# Patient Record
Sex: Female | Born: 1935
Health system: Southern US, Community
[De-identification: ages and names within clinical notes are randomized; demographics above are authoritative.]

## PROBLEM LIST (undated history)

## (undated) DIAGNOSIS — H04129 Dry eye syndrome of unspecified lacrimal gland: Secondary | ICD-10-CM

## (undated) DIAGNOSIS — I6523 Occlusion and stenosis of bilateral carotid arteries: Secondary | ICD-10-CM

## (undated) DIAGNOSIS — M1990 Unspecified osteoarthritis, unspecified site: Secondary | ICD-10-CM

## (undated) DIAGNOSIS — K449 Diaphragmatic hernia without obstruction or gangrene: Secondary | ICD-10-CM

## (undated) DIAGNOSIS — K219 Gastro-esophageal reflux disease without esophagitis: Secondary | ICD-10-CM

## (undated) DIAGNOSIS — E039 Hypothyroidism, unspecified: Secondary | ICD-10-CM

## (undated) DIAGNOSIS — G2581 Restless legs syndrome: Secondary | ICD-10-CM

## (undated) DIAGNOSIS — Z8673 Personal history of transient ischemic attack (TIA), and cerebral infarction without residual deficits: Secondary | ICD-10-CM

## (undated) DIAGNOSIS — Z973 Presence of spectacles and contact lenses: Secondary | ICD-10-CM

## (undated) DIAGNOSIS — Z974 Presence of external hearing-aid: Secondary | ICD-10-CM

## (undated) DIAGNOSIS — H353210 Exudative age-related macular degeneration, right eye, stage unspecified: Secondary | ICD-10-CM

## (undated) DIAGNOSIS — I5030 Unspecified diastolic (congestive) heart failure: Secondary | ICD-10-CM

## (undated) DIAGNOSIS — Z8719 Personal history of other diseases of the digestive system: Secondary | ICD-10-CM

## (undated) DIAGNOSIS — Z87898 Personal history of other specified conditions: Secondary | ICD-10-CM

## (undated) DIAGNOSIS — I10 Essential (primary) hypertension: Secondary | ICD-10-CM

## (undated) DIAGNOSIS — R399 Unspecified symptoms and signs involving the genitourinary system: Secondary | ICD-10-CM

## (undated) DIAGNOSIS — I878 Other specified disorders of veins: Secondary | ICD-10-CM

## (undated) DIAGNOSIS — I4892 Unspecified atrial flutter: Secondary | ICD-10-CM

## (undated) DIAGNOSIS — I340 Nonrheumatic mitral (valve) insufficiency: Secondary | ICD-10-CM

## (undated) DIAGNOSIS — T148XXA Other injury of unspecified body region, initial encounter: Secondary | ICD-10-CM

## (undated) HISTORY — PX: TONSILLECTOMY: SUR1361

## (undated) HISTORY — PX: TRANSTHORACIC ECHOCARDIOGRAM: SHX275

## (undated) HISTORY — PX: EYE SURGERY: SHX253

## (undated) HISTORY — PX: APPENDECTOMY: SHX54

## (undated) HISTORY — PX: CARDIOVASCULAR STRESS TEST: SHX262

## (undated) HISTORY — PX: DILATION AND CURETTAGE OF UTERUS: SHX78

## (undated) HISTORY — PX: LAPAROSCOPIC CHOLECYSTECTOMY: SUR755

## (undated) HISTORY — PX: CATARACT EXTRACTION W/ INTRAOCULAR LENS  IMPLANT, BILATERAL: SHX1307

## (undated) HISTORY — PX: FRACTURE SURGERY: SHX138

---

## 2002-05-23 HISTORY — PX: FOOT SURGERY: SHX648

## 2003-05-24 HISTORY — PX: TRIGGER FINGER RELEASE: SHX641

## 2008-05-23 HISTORY — PX: TOTAL KNEE ARTHROPLASTY: SHX125

## 2011-06-07 DIAGNOSIS — R300 Dysuria: Secondary | ICD-10-CM | POA: Diagnosis not present

## 2011-06-07 DIAGNOSIS — N8111 Cystocele, midline: Secondary | ICD-10-CM | POA: Diagnosis not present

## 2011-06-07 DIAGNOSIS — R350 Frequency of micturition: Secondary | ICD-10-CM | POA: Diagnosis not present

## 2011-07-31 DIAGNOSIS — R5381 Other malaise: Secondary | ICD-10-CM | POA: Diagnosis not present

## 2011-07-31 DIAGNOSIS — I10 Essential (primary) hypertension: Secondary | ICD-10-CM | POA: Diagnosis not present

## 2011-07-31 DIAGNOSIS — E7800 Pure hypercholesterolemia, unspecified: Secondary | ICD-10-CM | POA: Diagnosis not present

## 2011-07-31 DIAGNOSIS — R079 Chest pain, unspecified: Secondary | ICD-10-CM | POA: Diagnosis not present

## 2011-07-31 DIAGNOSIS — R42 Dizziness and giddiness: Secondary | ICD-10-CM | POA: Diagnosis not present

## 2011-07-31 DIAGNOSIS — I672 Cerebral atherosclerosis: Secondary | ICD-10-CM | POA: Diagnosis not present

## 2011-07-31 DIAGNOSIS — G319 Degenerative disease of nervous system, unspecified: Secondary | ICD-10-CM | POA: Diagnosis not present

## 2011-07-31 DIAGNOSIS — Z96659 Presence of unspecified artificial knee joint: Secondary | ICD-10-CM | POA: Diagnosis not present

## 2011-07-31 DIAGNOSIS — Z7982 Long term (current) use of aspirin: Secondary | ICD-10-CM | POA: Diagnosis not present

## 2011-07-31 DIAGNOSIS — R269 Unspecified abnormalities of gait and mobility: Secondary | ICD-10-CM | POA: Diagnosis not present

## 2011-07-31 DIAGNOSIS — H532 Diplopia: Secondary | ICD-10-CM | POA: Diagnosis present

## 2011-07-31 DIAGNOSIS — Z87891 Personal history of nicotine dependence: Secondary | ICD-10-CM | POA: Diagnosis not present

## 2011-07-31 DIAGNOSIS — E785 Hyperlipidemia, unspecified: Secondary | ICD-10-CM | POA: Diagnosis not present

## 2011-07-31 DIAGNOSIS — K219 Gastro-esophageal reflux disease without esophagitis: Secondary | ICD-10-CM | POA: Diagnosis not present

## 2011-07-31 DIAGNOSIS — E039 Hypothyroidism, unspecified: Secondary | ICD-10-CM | POA: Diagnosis not present

## 2011-07-31 DIAGNOSIS — I6789 Other cerebrovascular disease: Secondary | ICD-10-CM | POA: Diagnosis not present

## 2011-07-31 DIAGNOSIS — H538 Other visual disturbances: Secondary | ICD-10-CM | POA: Diagnosis not present

## 2011-07-31 DIAGNOSIS — I6529 Occlusion and stenosis of unspecified carotid artery: Secondary | ICD-10-CM | POA: Diagnosis not present

## 2011-07-31 DIAGNOSIS — I6509 Occlusion and stenosis of unspecified vertebral artery: Secondary | ICD-10-CM | POA: Diagnosis not present

## 2011-07-31 DIAGNOSIS — G459 Transient cerebral ischemic attack, unspecified: Secondary | ICD-10-CM | POA: Diagnosis not present

## 2011-08-02 DIAGNOSIS — I452 Bifascicular block: Secondary | ICD-10-CM | POA: Diagnosis not present

## 2011-08-12 DIAGNOSIS — E7800 Pure hypercholesterolemia, unspecified: Secondary | ICD-10-CM | POA: Diagnosis not present

## 2011-08-12 DIAGNOSIS — K219 Gastro-esophageal reflux disease without esophagitis: Secondary | ICD-10-CM | POA: Diagnosis not present

## 2011-08-20 DIAGNOSIS — E039 Hypothyroidism, unspecified: Secondary | ICD-10-CM | POA: Diagnosis not present

## 2011-08-20 DIAGNOSIS — I6350 Cerebral infarction due to unspecified occlusion or stenosis of unspecified cerebral artery: Secondary | ICD-10-CM | POA: Diagnosis not present

## 2011-08-20 DIAGNOSIS — I6789 Other cerebrovascular disease: Secondary | ICD-10-CM | POA: Diagnosis not present

## 2011-08-20 DIAGNOSIS — I10 Essential (primary) hypertension: Secondary | ICD-10-CM | POA: Diagnosis not present

## 2011-08-20 DIAGNOSIS — Z7902 Long term (current) use of antithrombotics/antiplatelets: Secondary | ICD-10-CM | POA: Diagnosis not present

## 2011-08-20 DIAGNOSIS — R4182 Altered mental status, unspecified: Secondary | ICD-10-CM | POA: Diagnosis not present

## 2011-08-20 DIAGNOSIS — I6509 Occlusion and stenosis of unspecified vertebral artery: Secondary | ICD-10-CM | POA: Diagnosis not present

## 2011-08-20 DIAGNOSIS — E785 Hyperlipidemia, unspecified: Secondary | ICD-10-CM | POA: Diagnosis not present

## 2011-08-20 DIAGNOSIS — G459 Transient cerebral ischemic attack, unspecified: Secondary | ICD-10-CM | POA: Diagnosis not present

## 2011-08-20 DIAGNOSIS — R4789 Other speech disturbances: Secondary | ICD-10-CM | POA: Diagnosis not present

## 2011-08-20 DIAGNOSIS — Z7982 Long term (current) use of aspirin: Secondary | ICD-10-CM | POA: Diagnosis not present

## 2011-08-20 DIAGNOSIS — I69992 Facial weakness following unspecified cerebrovascular disease: Secondary | ICD-10-CM | POA: Diagnosis not present

## 2011-08-20 DIAGNOSIS — R110 Nausea: Secondary | ICD-10-CM | POA: Diagnosis not present

## 2011-08-20 DIAGNOSIS — R269 Unspecified abnormalities of gait and mobility: Secondary | ICD-10-CM | POA: Diagnosis not present

## 2011-08-21 DIAGNOSIS — G459 Transient cerebral ischemic attack, unspecified: Secondary | ICD-10-CM | POA: Diagnosis not present

## 2011-09-20 DIAGNOSIS — E785 Hyperlipidemia, unspecified: Secondary | ICD-10-CM | POA: Diagnosis not present

## 2011-09-20 DIAGNOSIS — I6509 Occlusion and stenosis of unspecified vertebral artery: Secondary | ICD-10-CM | POA: Diagnosis not present

## 2011-09-20 DIAGNOSIS — G459 Transient cerebral ischemic attack, unspecified: Secondary | ICD-10-CM | POA: Diagnosis not present

## 2011-09-20 DIAGNOSIS — I10 Essential (primary) hypertension: Secondary | ICD-10-CM | POA: Diagnosis not present

## 2011-09-20 DIAGNOSIS — E039 Hypothyroidism, unspecified: Secondary | ICD-10-CM | POA: Diagnosis not present

## 2011-09-22 DIAGNOSIS — R0789 Other chest pain: Secondary | ICD-10-CM | POA: Diagnosis not present

## 2011-09-22 DIAGNOSIS — E039 Hypothyroidism, unspecified: Secondary | ICD-10-CM | POA: Diagnosis not present

## 2011-09-22 DIAGNOSIS — I10 Essential (primary) hypertension: Secondary | ICD-10-CM | POA: Diagnosis not present

## 2011-09-22 DIAGNOSIS — E782 Mixed hyperlipidemia: Secondary | ICD-10-CM | POA: Diagnosis not present

## 2011-09-23 DIAGNOSIS — E038 Other specified hypothyroidism: Secondary | ICD-10-CM | POA: Diagnosis not present

## 2011-09-23 DIAGNOSIS — R5383 Other fatigue: Secondary | ICD-10-CM | POA: Diagnosis not present

## 2011-09-23 DIAGNOSIS — R5381 Other malaise: Secondary | ICD-10-CM | POA: Diagnosis not present

## 2011-09-23 DIAGNOSIS — E7800 Pure hypercholesterolemia, unspecified: Secondary | ICD-10-CM | POA: Diagnosis not present

## 2011-09-28 DIAGNOSIS — H04129 Dry eye syndrome of unspecified lacrimal gland: Secondary | ICD-10-CM | POA: Diagnosis not present

## 2011-09-30 DIAGNOSIS — R079 Chest pain, unspecified: Secondary | ICD-10-CM | POA: Diagnosis not present

## 2011-10-07 DIAGNOSIS — I10 Essential (primary) hypertension: Secondary | ICD-10-CM | POA: Diagnosis not present

## 2011-10-07 DIAGNOSIS — E782 Mixed hyperlipidemia: Secondary | ICD-10-CM | POA: Diagnosis not present

## 2011-10-07 DIAGNOSIS — E039 Hypothyroidism, unspecified: Secondary | ICD-10-CM | POA: Diagnosis not present

## 2011-10-07 DIAGNOSIS — R0789 Other chest pain: Secondary | ICD-10-CM | POA: Diagnosis not present

## 2011-10-21 DIAGNOSIS — E878 Other disorders of electrolyte and fluid balance, not elsewhere classified: Secondary | ICD-10-CM | POA: Diagnosis not present

## 2011-10-26 DIAGNOSIS — K589 Irritable bowel syndrome without diarrhea: Secondary | ICD-10-CM | POA: Diagnosis not present

## 2011-10-26 DIAGNOSIS — R0789 Other chest pain: Secondary | ICD-10-CM | POA: Diagnosis not present

## 2011-10-26 DIAGNOSIS — K219 Gastro-esophageal reflux disease without esophagitis: Secondary | ICD-10-CM | POA: Diagnosis not present

## 2011-10-26 DIAGNOSIS — E782 Mixed hyperlipidemia: Secondary | ICD-10-CM | POA: Diagnosis not present

## 2011-10-26 DIAGNOSIS — I10 Essential (primary) hypertension: Secondary | ICD-10-CM | POA: Diagnosis not present

## 2011-10-26 DIAGNOSIS — E039 Hypothyroidism, unspecified: Secondary | ICD-10-CM | POA: Diagnosis not present

## 2011-10-27 DIAGNOSIS — M545 Low back pain: Secondary | ICD-10-CM | POA: Diagnosis not present

## 2011-11-08 DIAGNOSIS — E878 Other disorders of electrolyte and fluid balance, not elsewhere classified: Secondary | ICD-10-CM | POA: Diagnosis not present

## 2011-11-22 DIAGNOSIS — R748 Abnormal levels of other serum enzymes: Secondary | ICD-10-CM | POA: Diagnosis not present

## 2011-11-23 DIAGNOSIS — K769 Liver disease, unspecified: Secondary | ICD-10-CM | POA: Diagnosis not present

## 2011-11-23 DIAGNOSIS — H259 Unspecified age-related cataract: Secondary | ICD-10-CM | POA: Diagnosis not present

## 2011-11-23 DIAGNOSIS — H04529 Eversion of unspecified lacrimal punctum: Secondary | ICD-10-CM | POA: Diagnosis not present

## 2011-11-25 DIAGNOSIS — F322 Major depressive disorder, single episode, severe without psychotic features: Secondary | ICD-10-CM | POA: Diagnosis not present

## 2011-11-29 DIAGNOSIS — K219 Gastro-esophageal reflux disease without esophagitis: Secondary | ICD-10-CM | POA: Diagnosis not present

## 2011-11-30 DIAGNOSIS — R0789 Other chest pain: Secondary | ICD-10-CM | POA: Diagnosis not present

## 2011-11-30 DIAGNOSIS — I10 Essential (primary) hypertension: Secondary | ICD-10-CM | POA: Diagnosis not present

## 2011-11-30 DIAGNOSIS — E782 Mixed hyperlipidemia: Secondary | ICD-10-CM | POA: Diagnosis not present

## 2011-11-30 DIAGNOSIS — E039 Hypothyroidism, unspecified: Secondary | ICD-10-CM | POA: Diagnosis not present

## 2011-12-01 DIAGNOSIS — K219 Gastro-esophageal reflux disease without esophagitis: Secondary | ICD-10-CM | POA: Diagnosis not present

## 2011-12-01 DIAGNOSIS — R933 Abnormal findings on diagnostic imaging of other parts of digestive tract: Secondary | ICD-10-CM | POA: Diagnosis not present

## 2011-12-01 DIAGNOSIS — R1013 Epigastric pain: Secondary | ICD-10-CM | POA: Diagnosis not present

## 2011-12-01 DIAGNOSIS — R7989 Other specified abnormal findings of blood chemistry: Secondary | ICD-10-CM | POA: Diagnosis not present

## 2011-12-05 DIAGNOSIS — R5381 Other malaise: Secondary | ICD-10-CM | POA: Diagnosis not present

## 2011-12-05 DIAGNOSIS — F329 Major depressive disorder, single episode, unspecified: Secondary | ICD-10-CM | POA: Diagnosis not present

## 2011-12-05 DIAGNOSIS — R945 Abnormal results of liver function studies: Secondary | ICD-10-CM | POA: Diagnosis not present

## 2011-12-05 DIAGNOSIS — R5383 Other fatigue: Secondary | ICD-10-CM | POA: Diagnosis not present

## 2011-12-07 DIAGNOSIS — R5381 Other malaise: Secondary | ICD-10-CM | POA: Diagnosis not present

## 2011-12-07 DIAGNOSIS — R5383 Other fatigue: Secondary | ICD-10-CM | POA: Diagnosis not present

## 2011-12-07 DIAGNOSIS — R1013 Epigastric pain: Secondary | ICD-10-CM | POA: Diagnosis not present

## 2011-12-07 DIAGNOSIS — R933 Abnormal findings on diagnostic imaging of other parts of digestive tract: Secondary | ICD-10-CM | POA: Diagnosis not present

## 2011-12-07 DIAGNOSIS — R7989 Other specified abnormal findings of blood chemistry: Secondary | ICD-10-CM | POA: Diagnosis not present

## 2011-12-07 DIAGNOSIS — K745 Biliary cirrhosis, unspecified: Secondary | ICD-10-CM | POA: Diagnosis not present

## 2011-12-07 DIAGNOSIS — Z79899 Other long term (current) drug therapy: Secondary | ICD-10-CM | POA: Diagnosis not present

## 2011-12-09 DIAGNOSIS — M546 Pain in thoracic spine: Secondary | ICD-10-CM | POA: Diagnosis not present

## 2011-12-12 DIAGNOSIS — R109 Unspecified abdominal pain: Secondary | ICD-10-CM | POA: Diagnosis not present

## 2011-12-12 DIAGNOSIS — R7989 Other specified abnormal findings of blood chemistry: Secondary | ICD-10-CM | POA: Diagnosis not present

## 2011-12-14 DIAGNOSIS — K589 Irritable bowel syndrome without diarrhea: Secondary | ICD-10-CM | POA: Diagnosis not present

## 2011-12-14 DIAGNOSIS — K219 Gastro-esophageal reflux disease without esophagitis: Secondary | ICD-10-CM | POA: Diagnosis not present

## 2011-12-14 DIAGNOSIS — R945 Abnormal results of liver function studies: Secondary | ICD-10-CM | POA: Diagnosis not present

## 2011-12-16 DIAGNOSIS — M47814 Spondylosis without myelopathy or radiculopathy, thoracic region: Secondary | ICD-10-CM | POA: Diagnosis not present

## 2011-12-20 DIAGNOSIS — R5383 Other fatigue: Secondary | ICD-10-CM | POA: Diagnosis not present

## 2011-12-20 DIAGNOSIS — I10 Essential (primary) hypertension: Secondary | ICD-10-CM | POA: Diagnosis not present

## 2011-12-27 DIAGNOSIS — H353190 Nonexudative age-related macular degeneration, unspecified eye, stage unspecified: Secondary | ICD-10-CM | POA: Diagnosis not present

## 2011-12-27 DIAGNOSIS — G459 Transient cerebral ischemic attack, unspecified: Secondary | ICD-10-CM | POA: Diagnosis not present

## 2011-12-27 DIAGNOSIS — H43819 Vitreous degeneration, unspecified eye: Secondary | ICD-10-CM | POA: Diagnosis not present

## 2011-12-27 DIAGNOSIS — I6789 Other cerebrovascular disease: Secondary | ICD-10-CM | POA: Diagnosis not present

## 2011-12-27 DIAGNOSIS — I10 Essential (primary) hypertension: Secondary | ICD-10-CM | POA: Diagnosis not present

## 2011-12-27 DIAGNOSIS — H259 Unspecified age-related cataract: Secondary | ICD-10-CM | POA: Diagnosis not present

## 2011-12-27 DIAGNOSIS — E782 Mixed hyperlipidemia: Secondary | ICD-10-CM | POA: Diagnosis not present

## 2011-12-28 DIAGNOSIS — D259 Leiomyoma of uterus, unspecified: Secondary | ICD-10-CM | POA: Diagnosis not present

## 2011-12-28 DIAGNOSIS — Z1289 Encounter for screening for malignant neoplasm of other sites: Secondary | ICD-10-CM | POA: Diagnosis not present

## 2011-12-28 DIAGNOSIS — N854 Malposition of uterus: Secondary | ICD-10-CM | POA: Diagnosis not present

## 2011-12-28 DIAGNOSIS — N952 Postmenopausal atrophic vaginitis: Secondary | ICD-10-CM | POA: Diagnosis not present

## 2011-12-30 DIAGNOSIS — R1013 Epigastric pain: Secondary | ICD-10-CM | POA: Diagnosis not present

## 2012-01-03 DIAGNOSIS — R1012 Left upper quadrant pain: Secondary | ICD-10-CM | POA: Diagnosis not present

## 2012-01-13 DIAGNOSIS — R7611 Nonspecific reaction to tuberculin skin test without active tuberculosis: Secondary | ICD-10-CM | POA: Diagnosis not present

## 2012-01-17 DIAGNOSIS — H259 Unspecified age-related cataract: Secondary | ICD-10-CM | POA: Diagnosis not present

## 2012-02-01 DIAGNOSIS — F411 Generalized anxiety disorder: Secondary | ICD-10-CM | POA: Diagnosis not present

## 2012-02-01 DIAGNOSIS — I10 Essential (primary) hypertension: Secondary | ICD-10-CM | POA: Diagnosis not present

## 2012-02-10 DIAGNOSIS — H3530 Unspecified macular degeneration: Secondary | ICD-10-CM | POA: Diagnosis not present

## 2012-02-10 DIAGNOSIS — IMO0002 Reserved for concepts with insufficient information to code with codable children: Secondary | ICD-10-CM | POA: Diagnosis not present

## 2012-02-16 DIAGNOSIS — Z01818 Encounter for other preprocedural examination: Secondary | ICD-10-CM | POA: Diagnosis not present

## 2012-02-16 DIAGNOSIS — H269 Unspecified cataract: Secondary | ICD-10-CM | POA: Diagnosis not present

## 2012-03-08 DIAGNOSIS — IMO0002 Reserved for concepts with insufficient information to code with codable children: Secondary | ICD-10-CM | POA: Diagnosis not present

## 2012-03-08 DIAGNOSIS — H2510 Age-related nuclear cataract, unspecified eye: Secondary | ICD-10-CM | POA: Diagnosis not present

## 2012-03-08 DIAGNOSIS — H259 Unspecified age-related cataract: Secondary | ICD-10-CM | POA: Diagnosis not present

## 2012-03-14 DIAGNOSIS — K219 Gastro-esophageal reflux disease without esophagitis: Secondary | ICD-10-CM | POA: Diagnosis not present

## 2012-03-14 DIAGNOSIS — Z86010 Personal history of colonic polyps: Secondary | ICD-10-CM | POA: Diagnosis not present

## 2012-03-14 DIAGNOSIS — K589 Irritable bowel syndrome without diarrhea: Secondary | ICD-10-CM | POA: Diagnosis not present

## 2012-04-18 DIAGNOSIS — Z1231 Encounter for screening mammogram for malignant neoplasm of breast: Secondary | ICD-10-CM | POA: Diagnosis not present

## 2012-04-18 DIAGNOSIS — Z9189 Other specified personal risk factors, not elsewhere classified: Secondary | ICD-10-CM | POA: Diagnosis not present

## 2012-05-02 DIAGNOSIS — I10 Essential (primary) hypertension: Secondary | ICD-10-CM | POA: Diagnosis not present

## 2012-05-28 DIAGNOSIS — I6789 Other cerebrovascular disease: Secondary | ICD-10-CM | POA: Diagnosis not present

## 2012-05-28 DIAGNOSIS — I10 Essential (primary) hypertension: Secondary | ICD-10-CM | POA: Diagnosis not present

## 2012-05-28 DIAGNOSIS — G459 Transient cerebral ischemic attack, unspecified: Secondary | ICD-10-CM | POA: Diagnosis not present

## 2012-05-28 DIAGNOSIS — E782 Mixed hyperlipidemia: Secondary | ICD-10-CM | POA: Diagnosis not present

## 2012-05-28 DIAGNOSIS — Z01818 Encounter for other preprocedural examination: Secondary | ICD-10-CM | POA: Diagnosis not present

## 2012-06-08 DIAGNOSIS — H01009 Unspecified blepharitis unspecified eye, unspecified eyelid: Secondary | ICD-10-CM | POA: Diagnosis not present

## 2012-06-11 DIAGNOSIS — H2510 Age-related nuclear cataract, unspecified eye: Secondary | ICD-10-CM | POA: Diagnosis not present

## 2012-06-11 DIAGNOSIS — IMO0002 Reserved for concepts with insufficient information to code with codable children: Secondary | ICD-10-CM | POA: Diagnosis not present

## 2012-06-11 DIAGNOSIS — H259 Unspecified age-related cataract: Secondary | ICD-10-CM | POA: Diagnosis not present

## 2012-07-16 DIAGNOSIS — H18839 Recurrent erosion of cornea, unspecified eye: Secondary | ICD-10-CM | POA: Diagnosis not present

## 2012-07-18 DIAGNOSIS — G459 Transient cerebral ischemic attack, unspecified: Secondary | ICD-10-CM | POA: Diagnosis not present

## 2012-07-18 DIAGNOSIS — E782 Mixed hyperlipidemia: Secondary | ICD-10-CM | POA: Diagnosis not present

## 2012-07-18 DIAGNOSIS — I10 Essential (primary) hypertension: Secondary | ICD-10-CM | POA: Diagnosis not present

## 2012-07-18 DIAGNOSIS — I6789 Other cerebrovascular disease: Secondary | ICD-10-CM | POA: Diagnosis not present

## 2012-07-23 DIAGNOSIS — H18839 Recurrent erosion of cornea, unspecified eye: Secondary | ICD-10-CM | POA: Diagnosis not present

## 2012-07-29 DIAGNOSIS — S058X9A Other injuries of unspecified eye and orbit, initial encounter: Secondary | ICD-10-CM | POA: Diagnosis not present

## 2012-07-31 DIAGNOSIS — S058X9A Other injuries of unspecified eye and orbit, initial encounter: Secondary | ICD-10-CM | POA: Diagnosis not present

## 2012-07-31 DIAGNOSIS — H353190 Nonexudative age-related macular degeneration, unspecified eye, stage unspecified: Secondary | ICD-10-CM | POA: Diagnosis not present

## 2012-07-31 DIAGNOSIS — H43819 Vitreous degeneration, unspecified eye: Secondary | ICD-10-CM | POA: Diagnosis not present

## 2012-08-01 DIAGNOSIS — J069 Acute upper respiratory infection, unspecified: Secondary | ICD-10-CM | POA: Diagnosis not present

## 2012-08-01 DIAGNOSIS — J029 Acute pharyngitis, unspecified: Secondary | ICD-10-CM | POA: Diagnosis not present

## 2012-08-01 DIAGNOSIS — B9789 Other viral agents as the cause of diseases classified elsewhere: Secondary | ICD-10-CM | POA: Diagnosis not present

## 2012-08-08 DIAGNOSIS — H18839 Recurrent erosion of cornea, unspecified eye: Secondary | ICD-10-CM | POA: Diagnosis not present

## 2012-08-13 DIAGNOSIS — H18839 Recurrent erosion of cornea, unspecified eye: Secondary | ICD-10-CM | POA: Diagnosis not present

## 2012-08-21 DIAGNOSIS — H18839 Recurrent erosion of cornea, unspecified eye: Secondary | ICD-10-CM | POA: Diagnosis not present

## 2012-09-05 DIAGNOSIS — Z86010 Personal history of colonic polyps: Secondary | ICD-10-CM | POA: Diagnosis not present

## 2012-09-05 DIAGNOSIS — K5900 Constipation, unspecified: Secondary | ICD-10-CM | POA: Diagnosis not present

## 2012-09-05 DIAGNOSIS — K589 Irritable bowel syndrome without diarrhea: Secondary | ICD-10-CM | POA: Diagnosis not present

## 2012-09-05 DIAGNOSIS — K219 Gastro-esophageal reflux disease without esophagitis: Secondary | ICD-10-CM | POA: Diagnosis not present

## 2012-09-05 DIAGNOSIS — Z1211 Encounter for screening for malignant neoplasm of colon: Secondary | ICD-10-CM | POA: Diagnosis not present

## 2012-09-06 DIAGNOSIS — H18839 Recurrent erosion of cornea, unspecified eye: Secondary | ICD-10-CM | POA: Diagnosis not present

## 2012-09-07 DIAGNOSIS — Z1211 Encounter for screening for malignant neoplasm of colon: Secondary | ICD-10-CM | POA: Diagnosis not present

## 2012-09-08 DIAGNOSIS — Z1211 Encounter for screening for malignant neoplasm of colon: Secondary | ICD-10-CM | POA: Diagnosis not present

## 2012-09-17 DIAGNOSIS — B9789 Other viral agents as the cause of diseases classified elsewhere: Secondary | ICD-10-CM | POA: Diagnosis not present

## 2012-09-17 DIAGNOSIS — J069 Acute upper respiratory infection, unspecified: Secondary | ICD-10-CM | POA: Diagnosis not present

## 2012-09-17 DIAGNOSIS — R05 Cough: Secondary | ICD-10-CM | POA: Diagnosis not present

## 2012-09-19 DIAGNOSIS — H18839 Recurrent erosion of cornea, unspecified eye: Secondary | ICD-10-CM | POA: Diagnosis not present

## 2012-09-25 DIAGNOSIS — H18839 Recurrent erosion of cornea, unspecified eye: Secondary | ICD-10-CM | POA: Diagnosis not present

## 2012-09-26 DIAGNOSIS — J309 Allergic rhinitis, unspecified: Secondary | ICD-10-CM | POA: Diagnosis not present

## 2012-09-26 DIAGNOSIS — K769 Liver disease, unspecified: Secondary | ICD-10-CM | POA: Diagnosis not present

## 2012-09-30 DIAGNOSIS — H18839 Recurrent erosion of cornea, unspecified eye: Secondary | ICD-10-CM | POA: Diagnosis not present

## 2012-10-03 DIAGNOSIS — J309 Allergic rhinitis, unspecified: Secondary | ICD-10-CM | POA: Diagnosis not present

## 2012-10-04 DIAGNOSIS — H18839 Recurrent erosion of cornea, unspecified eye: Secondary | ICD-10-CM | POA: Diagnosis not present

## 2012-10-10 DIAGNOSIS — H18839 Recurrent erosion of cornea, unspecified eye: Secondary | ICD-10-CM | POA: Diagnosis not present

## 2012-10-25 DIAGNOSIS — H18839 Recurrent erosion of cornea, unspecified eye: Secondary | ICD-10-CM | POA: Diagnosis not present

## 2012-11-19 DIAGNOSIS — H18839 Recurrent erosion of cornea, unspecified eye: Secondary | ICD-10-CM | POA: Diagnosis not present

## 2012-11-22 DIAGNOSIS — H18839 Recurrent erosion of cornea, unspecified eye: Secondary | ICD-10-CM | POA: Diagnosis not present

## 2012-11-26 DIAGNOSIS — H18839 Recurrent erosion of cornea, unspecified eye: Secondary | ICD-10-CM | POA: Diagnosis not present

## 2012-12-05 DIAGNOSIS — H18839 Recurrent erosion of cornea, unspecified eye: Secondary | ICD-10-CM | POA: Diagnosis not present

## 2012-12-10 DIAGNOSIS — H18839 Recurrent erosion of cornea, unspecified eye: Secondary | ICD-10-CM | POA: Diagnosis not present

## 2012-12-13 DIAGNOSIS — IMO0002 Reserved for concepts with insufficient information to code with codable children: Secondary | ICD-10-CM | POA: Diagnosis not present

## 2012-12-13 DIAGNOSIS — H04129 Dry eye syndrome of unspecified lacrimal gland: Secondary | ICD-10-CM | POA: Diagnosis not present

## 2012-12-14 DIAGNOSIS — H04119 Dacryops of unspecified lacrimal gland: Secondary | ICD-10-CM | POA: Diagnosis not present

## 2012-12-20 DIAGNOSIS — H18839 Recurrent erosion of cornea, unspecified eye: Secondary | ICD-10-CM | POA: Diagnosis not present

## 2012-12-26 DIAGNOSIS — K110 Atrophy of salivary gland: Secondary | ICD-10-CM | POA: Diagnosis not present

## 2012-12-27 DIAGNOSIS — H18839 Recurrent erosion of cornea, unspecified eye: Secondary | ICD-10-CM | POA: Diagnosis not present

## 2013-01-08 DIAGNOSIS — H18839 Recurrent erosion of cornea, unspecified eye: Secondary | ICD-10-CM | POA: Diagnosis not present

## 2013-01-13 DIAGNOSIS — H18839 Recurrent erosion of cornea, unspecified eye: Secondary | ICD-10-CM | POA: Diagnosis not present

## 2013-01-16 DIAGNOSIS — H18839 Recurrent erosion of cornea, unspecified eye: Secondary | ICD-10-CM | POA: Diagnosis not present

## 2013-01-24 DIAGNOSIS — H18839 Recurrent erosion of cornea, unspecified eye: Secondary | ICD-10-CM | POA: Diagnosis not present

## 2013-01-24 DIAGNOSIS — H168 Other keratitis: Secondary | ICD-10-CM | POA: Diagnosis not present

## 2013-01-30 DIAGNOSIS — H18839 Recurrent erosion of cornea, unspecified eye: Secondary | ICD-10-CM | POA: Diagnosis not present

## 2013-01-30 DIAGNOSIS — H16109 Unspecified superficial keratitis, unspecified eye: Secondary | ICD-10-CM | POA: Diagnosis not present

## 2013-02-04 DIAGNOSIS — E782 Mixed hyperlipidemia: Secondary | ICD-10-CM | POA: Diagnosis not present

## 2013-02-04 DIAGNOSIS — G459 Transient cerebral ischemic attack, unspecified: Secondary | ICD-10-CM | POA: Diagnosis not present

## 2013-02-04 DIAGNOSIS — I6789 Other cerebrovascular disease: Secondary | ICD-10-CM | POA: Diagnosis not present

## 2013-02-04 DIAGNOSIS — I10 Essential (primary) hypertension: Secondary | ICD-10-CM | POA: Diagnosis not present

## 2013-02-07 DIAGNOSIS — H18839 Recurrent erosion of cornea, unspecified eye: Secondary | ICD-10-CM | POA: Diagnosis not present

## 2013-02-26 DIAGNOSIS — H18839 Recurrent erosion of cornea, unspecified eye: Secondary | ICD-10-CM | POA: Diagnosis not present

## 2013-03-04 DIAGNOSIS — H18839 Recurrent erosion of cornea, unspecified eye: Secondary | ICD-10-CM | POA: Diagnosis not present

## 2013-03-25 DIAGNOSIS — H18839 Recurrent erosion of cornea, unspecified eye: Secondary | ICD-10-CM | POA: Diagnosis not present

## 2013-04-03 DIAGNOSIS — I10 Essential (primary) hypertension: Secondary | ICD-10-CM | POA: Diagnosis not present

## 2013-04-03 DIAGNOSIS — E039 Hypothyroidism, unspecified: Secondary | ICD-10-CM | POA: Diagnosis not present

## 2013-04-08 DIAGNOSIS — H18839 Recurrent erosion of cornea, unspecified eye: Secondary | ICD-10-CM | POA: Diagnosis not present

## 2013-04-10 DIAGNOSIS — K219 Gastro-esophageal reflux disease without esophagitis: Secondary | ICD-10-CM | POA: Diagnosis not present

## 2013-04-10 DIAGNOSIS — K589 Irritable bowel syndrome without diarrhea: Secondary | ICD-10-CM | POA: Diagnosis not present

## 2013-04-10 DIAGNOSIS — K745 Biliary cirrhosis, unspecified: Secondary | ICD-10-CM | POA: Diagnosis not present

## 2013-04-15 DIAGNOSIS — J069 Acute upper respiratory infection, unspecified: Secondary | ICD-10-CM | POA: Diagnosis not present

## 2013-04-15 DIAGNOSIS — B9789 Other viral agents as the cause of diseases classified elsewhere: Secondary | ICD-10-CM | POA: Diagnosis not present

## 2013-04-26 DIAGNOSIS — H04129 Dry eye syndrome of unspecified lacrimal gland: Secondary | ICD-10-CM | POA: Diagnosis not present

## 2013-04-29 DIAGNOSIS — H18839 Recurrent erosion of cornea, unspecified eye: Secondary | ICD-10-CM | POA: Diagnosis not present

## 2013-04-30 DIAGNOSIS — E039 Hypothyroidism, unspecified: Secondary | ICD-10-CM | POA: Diagnosis not present

## 2013-04-30 DIAGNOSIS — G459 Transient cerebral ischemic attack, unspecified: Secondary | ICD-10-CM | POA: Diagnosis not present

## 2013-04-30 DIAGNOSIS — I10 Essential (primary) hypertension: Secondary | ICD-10-CM | POA: Diagnosis not present

## 2013-05-06 DIAGNOSIS — H18839 Recurrent erosion of cornea, unspecified eye: Secondary | ICD-10-CM | POA: Diagnosis not present

## 2013-05-07 DIAGNOSIS — H18839 Recurrent erosion of cornea, unspecified eye: Secondary | ICD-10-CM | POA: Diagnosis not present

## 2013-05-07 DIAGNOSIS — H2510 Age-related nuclear cataract, unspecified eye: Secondary | ICD-10-CM | POA: Diagnosis not present

## 2013-05-07 DIAGNOSIS — H04129 Dry eye syndrome of unspecified lacrimal gland: Secondary | ICD-10-CM | POA: Diagnosis not present

## 2013-05-12 DIAGNOSIS — H18839 Recurrent erosion of cornea, unspecified eye: Secondary | ICD-10-CM | POA: Diagnosis not present

## 2013-05-20 DIAGNOSIS — H18839 Recurrent erosion of cornea, unspecified eye: Secondary | ICD-10-CM | POA: Diagnosis not present

## 2013-05-21 DIAGNOSIS — H04129 Dry eye syndrome of unspecified lacrimal gland: Secondary | ICD-10-CM | POA: Diagnosis not present

## 2013-05-21 DIAGNOSIS — H01009 Unspecified blepharitis unspecified eye, unspecified eyelid: Secondary | ICD-10-CM | POA: Diagnosis not present

## 2013-05-29 DIAGNOSIS — Z1231 Encounter for screening mammogram for malignant neoplasm of breast: Secondary | ICD-10-CM | POA: Diagnosis not present

## 2013-05-30 DIAGNOSIS — H18839 Recurrent erosion of cornea, unspecified eye: Secondary | ICD-10-CM | POA: Diagnosis not present

## 2013-05-31 DIAGNOSIS — L989 Disorder of the skin and subcutaneous tissue, unspecified: Secondary | ICD-10-CM | POA: Diagnosis not present

## 2013-05-31 DIAGNOSIS — H6120 Impacted cerumen, unspecified ear: Secondary | ICD-10-CM | POA: Diagnosis not present

## 2013-06-20 DIAGNOSIS — H01009 Unspecified blepharitis unspecified eye, unspecified eyelid: Secondary | ICD-10-CM | POA: Diagnosis not present

## 2013-06-20 DIAGNOSIS — H04129 Dry eye syndrome of unspecified lacrimal gland: Secondary | ICD-10-CM | POA: Diagnosis not present

## 2013-06-26 DIAGNOSIS — I10 Essential (primary) hypertension: Secondary | ICD-10-CM | POA: Diagnosis not present

## 2013-06-26 DIAGNOSIS — I6789 Other cerebrovascular disease: Secondary | ICD-10-CM | POA: Diagnosis not present

## 2013-06-26 DIAGNOSIS — E782 Mixed hyperlipidemia: Secondary | ICD-10-CM | POA: Diagnosis not present

## 2013-06-26 DIAGNOSIS — G459 Transient cerebral ischemic attack, unspecified: Secondary | ICD-10-CM | POA: Diagnosis not present

## 2013-06-26 DIAGNOSIS — E7800 Pure hypercholesterolemia, unspecified: Secondary | ICD-10-CM | POA: Diagnosis not present

## 2013-07-04 DIAGNOSIS — R030 Elevated blood-pressure reading, without diagnosis of hypertension: Secondary | ICD-10-CM | POA: Diagnosis not present

## 2013-07-09 DIAGNOSIS — E878 Other disorders of electrolyte and fluid balance, not elsewhere classified: Secondary | ICD-10-CM | POA: Diagnosis not present

## 2013-07-17 DIAGNOSIS — E782 Mixed hyperlipidemia: Secondary | ICD-10-CM | POA: Diagnosis not present

## 2013-07-17 DIAGNOSIS — R0789 Other chest pain: Secondary | ICD-10-CM | POA: Diagnosis not present

## 2013-07-17 DIAGNOSIS — J449 Chronic obstructive pulmonary disease, unspecified: Secondary | ICD-10-CM | POA: Diagnosis not present

## 2013-07-17 DIAGNOSIS — I10 Essential (primary) hypertension: Secondary | ICD-10-CM | POA: Diagnosis not present

## 2013-07-19 DIAGNOSIS — N8111 Cystocele, midline: Secondary | ICD-10-CM | POA: Diagnosis not present

## 2013-07-19 DIAGNOSIS — IMO0002 Reserved for concepts with insufficient information to code with codable children: Secondary | ICD-10-CM | POA: Diagnosis not present

## 2013-07-19 DIAGNOSIS — Z1289 Encounter for screening for malignant neoplasm of other sites: Secondary | ICD-10-CM | POA: Diagnosis not present

## 2013-07-31 DIAGNOSIS — L02619 Cutaneous abscess of unspecified foot: Secondary | ICD-10-CM | POA: Diagnosis not present

## 2013-08-15 DIAGNOSIS — Z961 Presence of intraocular lens: Secondary | ICD-10-CM | POA: Diagnosis not present

## 2013-08-15 DIAGNOSIS — H18509 Unspecified hereditary corneal dystrophies, unspecified eye: Secondary | ICD-10-CM | POA: Diagnosis not present

## 2013-08-15 DIAGNOSIS — H353190 Nonexudative age-related macular degeneration, unspecified eye, stage unspecified: Secondary | ICD-10-CM | POA: Diagnosis not present

## 2013-08-15 DIAGNOSIS — H04129 Dry eye syndrome of unspecified lacrimal gland: Secondary | ICD-10-CM | POA: Diagnosis not present

## 2013-09-03 DIAGNOSIS — G459 Transient cerebral ischemic attack, unspecified: Secondary | ICD-10-CM | POA: Diagnosis not present

## 2013-09-10 DIAGNOSIS — H353190 Nonexudative age-related macular degeneration, unspecified eye, stage unspecified: Secondary | ICD-10-CM | POA: Diagnosis not present

## 2013-09-10 DIAGNOSIS — H43399 Other vitreous opacities, unspecified eye: Secondary | ICD-10-CM | POA: Diagnosis not present

## 2013-09-10 DIAGNOSIS — H43819 Vitreous degeneration, unspecified eye: Secondary | ICD-10-CM | POA: Diagnosis not present

## 2013-09-17 DIAGNOSIS — H04129 Dry eye syndrome of unspecified lacrimal gland: Secondary | ICD-10-CM | POA: Diagnosis not present

## 2013-10-16 DIAGNOSIS — K745 Biliary cirrhosis, unspecified: Secondary | ICD-10-CM | POA: Diagnosis not present

## 2013-10-16 DIAGNOSIS — K5900 Constipation, unspecified: Secondary | ICD-10-CM | POA: Diagnosis not present

## 2013-10-16 DIAGNOSIS — K219 Gastro-esophageal reflux disease without esophagitis: Secondary | ICD-10-CM | POA: Diagnosis not present

## 2013-10-16 DIAGNOSIS — K589 Irritable bowel syndrome without diarrhea: Secondary | ICD-10-CM | POA: Diagnosis not present

## 2013-10-23 DIAGNOSIS — H04129 Dry eye syndrome of unspecified lacrimal gland: Secondary | ICD-10-CM | POA: Diagnosis not present

## 2013-10-24 DIAGNOSIS — K7460 Unspecified cirrhosis of liver: Secondary | ICD-10-CM | POA: Diagnosis not present

## 2013-10-29 DIAGNOSIS — Z79899 Other long term (current) drug therapy: Secondary | ICD-10-CM | POA: Diagnosis not present

## 2013-11-29 DIAGNOSIS — R55 Syncope and collapse: Secondary | ICD-10-CM | POA: Diagnosis not present

## 2013-12-01 DIAGNOSIS — S4980XA Other specified injuries of shoulder and upper arm, unspecified arm, initial encounter: Secondary | ICD-10-CM | POA: Diagnosis not present

## 2013-12-01 DIAGNOSIS — M47812 Spondylosis without myelopathy or radiculopathy, cervical region: Secondary | ICD-10-CM | POA: Diagnosis not present

## 2013-12-01 DIAGNOSIS — S0993XA Unspecified injury of face, initial encounter: Secondary | ICD-10-CM | POA: Diagnosis not present

## 2013-12-01 DIAGNOSIS — R55 Syncope and collapse: Secondary | ICD-10-CM | POA: Diagnosis not present

## 2013-12-01 DIAGNOSIS — S199XXA Unspecified injury of neck, initial encounter: Secondary | ICD-10-CM | POA: Diagnosis not present

## 2013-12-01 DIAGNOSIS — S0003XA Contusion of scalp, initial encounter: Secondary | ICD-10-CM | POA: Diagnosis not present

## 2013-12-01 DIAGNOSIS — S0990XA Unspecified injury of head, initial encounter: Secondary | ICD-10-CM | POA: Diagnosis not present

## 2013-12-01 DIAGNOSIS — S300XXA Contusion of lower back and pelvis, initial encounter: Secondary | ICD-10-CM | POA: Diagnosis not present

## 2013-12-01 DIAGNOSIS — S46909A Unspecified injury of unspecified muscle, fascia and tendon at shoulder and upper arm level, unspecified arm, initial encounter: Secondary | ICD-10-CM | POA: Diagnosis not present

## 2013-12-01 DIAGNOSIS — S20229A Contusion of unspecified back wall of thorax, initial encounter: Secondary | ICD-10-CM | POA: Diagnosis not present

## 2013-12-12 DIAGNOSIS — S20229A Contusion of unspecified back wall of thorax, initial encounter: Secondary | ICD-10-CM | POA: Diagnosis not present

## 2013-12-12 DIAGNOSIS — K449 Diaphragmatic hernia without obstruction or gangrene: Secondary | ICD-10-CM | POA: Diagnosis not present

## 2013-12-12 DIAGNOSIS — S1093XA Contusion of unspecified part of neck, initial encounter: Secondary | ICD-10-CM | POA: Diagnosis not present

## 2013-12-12 DIAGNOSIS — H04129 Dry eye syndrome of unspecified lacrimal gland: Secondary | ICD-10-CM | POA: Diagnosis not present

## 2013-12-12 DIAGNOSIS — S92309A Fracture of unspecified metatarsal bone(s), unspecified foot, initial encounter for closed fracture: Secondary | ICD-10-CM | POA: Diagnosis not present

## 2013-12-12 DIAGNOSIS — S92209A Fracture of unspecified tarsal bone(s) of unspecified foot, initial encounter for closed fracture: Secondary | ICD-10-CM | POA: Diagnosis not present

## 2013-12-12 DIAGNOSIS — S0003XA Contusion of scalp, initial encounter: Secondary | ICD-10-CM | POA: Diagnosis not present

## 2013-12-12 DIAGNOSIS — H353190 Nonexudative age-related macular degeneration, unspecified eye, stage unspecified: Secondary | ICD-10-CM | POA: Diagnosis not present

## 2013-12-12 DIAGNOSIS — E039 Hypothyroidism, unspecified: Secondary | ICD-10-CM | POA: Diagnosis not present

## 2013-12-12 DIAGNOSIS — Z961 Presence of intraocular lens: Secondary | ICD-10-CM | POA: Diagnosis not present

## 2014-01-17 DIAGNOSIS — H43819 Vitreous degeneration, unspecified eye: Secondary | ICD-10-CM | POA: Diagnosis not present

## 2014-01-17 DIAGNOSIS — H353190 Nonexudative age-related macular degeneration, unspecified eye, stage unspecified: Secondary | ICD-10-CM | POA: Diagnosis not present

## 2014-01-31 DIAGNOSIS — G459 Transient cerebral ischemic attack, unspecified: Secondary | ICD-10-CM | POA: Diagnosis not present

## 2014-01-31 DIAGNOSIS — E039 Hypothyroidism, unspecified: Secondary | ICD-10-CM | POA: Diagnosis not present

## 2014-01-31 DIAGNOSIS — M8448XA Pathological fracture, other site, initial encounter for fracture: Secondary | ICD-10-CM | POA: Diagnosis not present

## 2014-01-31 DIAGNOSIS — I10 Essential (primary) hypertension: Secondary | ICD-10-CM | POA: Diagnosis not present

## 2014-01-31 DIAGNOSIS — R350 Frequency of micturition: Secondary | ICD-10-CM | POA: Diagnosis not present

## 2014-01-31 DIAGNOSIS — I498 Other specified cardiac arrhythmias: Secondary | ICD-10-CM | POA: Diagnosis not present

## 2014-01-31 DIAGNOSIS — K219 Gastro-esophageal reflux disease without esophagitis: Secondary | ICD-10-CM | POA: Diagnosis not present

## 2014-01-31 DIAGNOSIS — E785 Hyperlipidemia, unspecified: Secondary | ICD-10-CM | POA: Diagnosis not present

## 2014-02-10 ENCOUNTER — Other Ambulatory Visit: Payer: Self-pay | Admitting: Family Medicine

## 2014-02-10 DIAGNOSIS — G459 Transient cerebral ischemic attack, unspecified: Secondary | ICD-10-CM

## 2014-02-14 ENCOUNTER — Ambulatory Visit
Admission: RE | Admit: 2014-02-14 | Discharge: 2014-02-14 | Disposition: A | Discharge status: Home / Self Care | Payer: Medicare Other | Source: Ambulatory Visit | Attending: Family Medicine | Admitting: Family Medicine

## 2014-02-14 DIAGNOSIS — I6529 Occlusion and stenosis of unspecified carotid artery: Secondary | ICD-10-CM | POA: Diagnosis not present

## 2014-02-14 DIAGNOSIS — G459 Transient cerebral ischemic attack, unspecified: Secondary | ICD-10-CM

## 2014-02-14 DIAGNOSIS — I6509 Occlusion and stenosis of unspecified vertebral artery: Secondary | ICD-10-CM | POA: Diagnosis not present

## 2014-03-17 DIAGNOSIS — H04123 Dry eye syndrome of bilateral lacrimal glands: Secondary | ICD-10-CM | POA: Diagnosis not present

## 2014-03-17 DIAGNOSIS — H26493 Other secondary cataract, bilateral: Secondary | ICD-10-CM | POA: Diagnosis not present

## 2014-03-17 DIAGNOSIS — H02834 Dermatochalasis of left upper eyelid: Secondary | ICD-10-CM | POA: Diagnosis not present

## 2014-03-17 DIAGNOSIS — H02831 Dermatochalasis of right upper eyelid: Secondary | ICD-10-CM | POA: Diagnosis not present

## 2014-03-27 DIAGNOSIS — I6529 Occlusion and stenosis of unspecified carotid artery: Secondary | ICD-10-CM | POA: Diagnosis not present

## 2014-03-27 DIAGNOSIS — L989 Disorder of the skin and subcutaneous tissue, unspecified: Secondary | ICD-10-CM | POA: Diagnosis not present

## 2014-03-27 DIAGNOSIS — I10 Essential (primary) hypertension: Secondary | ICD-10-CM | POA: Diagnosis not present

## 2014-03-27 DIAGNOSIS — Z79899 Other long term (current) drug therapy: Secondary | ICD-10-CM | POA: Diagnosis not present

## 2014-03-27 DIAGNOSIS — G459 Transient cerebral ischemic attack, unspecified: Secondary | ICD-10-CM | POA: Diagnosis not present

## 2014-03-27 DIAGNOSIS — E785 Hyperlipidemia, unspecified: Secondary | ICD-10-CM | POA: Diagnosis not present

## 2014-03-27 DIAGNOSIS — E039 Hypothyroidism, unspecified: Secondary | ICD-10-CM | POA: Diagnosis not present

## 2014-04-10 DIAGNOSIS — I6529 Occlusion and stenosis of unspecified carotid artery: Secondary | ICD-10-CM | POA: Diagnosis not present

## 2014-04-10 DIAGNOSIS — I10 Essential (primary) hypertension: Secondary | ICD-10-CM | POA: Diagnosis not present

## 2014-04-10 DIAGNOSIS — E039 Hypothyroidism, unspecified: Secondary | ICD-10-CM | POA: Diagnosis not present

## 2014-04-10 DIAGNOSIS — E785 Hyperlipidemia, unspecified: Secondary | ICD-10-CM | POA: Diagnosis not present

## 2014-04-10 DIAGNOSIS — Z79899 Other long term (current) drug therapy: Secondary | ICD-10-CM | POA: Diagnosis not present

## 2014-04-30 DIAGNOSIS — L821 Other seborrheic keratosis: Secondary | ICD-10-CM | POA: Diagnosis not present

## 2014-04-30 DIAGNOSIS — L570 Actinic keratosis: Secondary | ICD-10-CM | POA: Diagnosis not present

## 2014-05-05 DIAGNOSIS — H3531 Nonexudative age-related macular degeneration: Secondary | ICD-10-CM | POA: Diagnosis not present

## 2014-05-22 DIAGNOSIS — H26493 Other secondary cataract, bilateral: Secondary | ICD-10-CM | POA: Diagnosis not present

## 2014-05-22 DIAGNOSIS — H18833 Recurrent erosion of cornea, bilateral: Secondary | ICD-10-CM | POA: Diagnosis not present

## 2014-06-04 DIAGNOSIS — H3531 Nonexudative age-related macular degeneration: Secondary | ICD-10-CM | POA: Diagnosis not present

## 2014-07-04 DIAGNOSIS — H3531 Nonexudative age-related macular degeneration: Secondary | ICD-10-CM | POA: Diagnosis not present

## 2014-07-29 DIAGNOSIS — Z1239 Encounter for other screening for malignant neoplasm of breast: Secondary | ICD-10-CM | POA: Diagnosis not present

## 2014-07-29 DIAGNOSIS — R413 Other amnesia: Secondary | ICD-10-CM | POA: Diagnosis not present

## 2014-07-29 DIAGNOSIS — Z8673 Personal history of transient ischemic attack (TIA), and cerebral infarction without residual deficits: Secondary | ICD-10-CM | POA: Diagnosis not present

## 2014-07-29 DIAGNOSIS — I10 Essential (primary) hypertension: Secondary | ICD-10-CM | POA: Diagnosis not present

## 2014-07-29 DIAGNOSIS — R5383 Other fatigue: Secondary | ICD-10-CM | POA: Diagnosis not present

## 2014-07-29 DIAGNOSIS — E039 Hypothyroidism, unspecified: Secondary | ICD-10-CM | POA: Diagnosis not present

## 2014-07-29 DIAGNOSIS — M859 Disorder of bone density and structure, unspecified: Secondary | ICD-10-CM | POA: Diagnosis not present

## 2014-07-29 DIAGNOSIS — R682 Dry mouth, unspecified: Secondary | ICD-10-CM | POA: Diagnosis not present

## 2014-07-31 ENCOUNTER — Other Ambulatory Visit: Payer: Self-pay | Admitting: Family Medicine

## 2014-07-31 DIAGNOSIS — Z1231 Encounter for screening mammogram for malignant neoplasm of breast: Secondary | ICD-10-CM

## 2014-07-31 DIAGNOSIS — M8580 Other specified disorders of bone density and structure, unspecified site: Secondary | ICD-10-CM

## 2014-08-03 DIAGNOSIS — H3531 Nonexudative age-related macular degeneration: Secondary | ICD-10-CM | POA: Diagnosis not present

## 2014-08-11 ENCOUNTER — Ambulatory Visit
Admission: RE | Admit: 2014-08-11 | Discharge: 2014-08-11 | Disposition: A | Discharge status: Home / Self Care | Payer: Medicare Other | Source: Ambulatory Visit | Attending: Family Medicine | Admitting: Family Medicine

## 2014-08-11 DIAGNOSIS — M8580 Other specified disorders of bone density and structure, unspecified site: Secondary | ICD-10-CM

## 2014-08-11 DIAGNOSIS — M8588 Other specified disorders of bone density and structure, other site: Secondary | ICD-10-CM | POA: Diagnosis not present

## 2014-08-11 DIAGNOSIS — Z1231 Encounter for screening mammogram for malignant neoplasm of breast: Secondary | ICD-10-CM

## 2014-08-11 DIAGNOSIS — M85852 Other specified disorders of bone density and structure, left thigh: Secondary | ICD-10-CM | POA: Diagnosis not present

## 2014-09-02 DIAGNOSIS — H3531 Nonexudative age-related macular degeneration: Secondary | ICD-10-CM | POA: Diagnosis not present

## 2014-09-03 DIAGNOSIS — Z86010 Personal history of colonic polyps: Secondary | ICD-10-CM | POA: Diagnosis not present

## 2014-09-03 DIAGNOSIS — K219 Gastro-esophageal reflux disease without esophagitis: Secondary | ICD-10-CM | POA: Diagnosis not present

## 2014-09-29 DIAGNOSIS — Z86010 Personal history of colonic polyps: Secondary | ICD-10-CM | POA: Diagnosis not present

## 2014-09-29 DIAGNOSIS — D120 Benign neoplasm of cecum: Secondary | ICD-10-CM | POA: Diagnosis not present

## 2014-09-29 DIAGNOSIS — D126 Benign neoplasm of colon, unspecified: Secondary | ICD-10-CM | POA: Diagnosis not present

## 2014-09-29 DIAGNOSIS — K5730 Diverticulosis of large intestine without perforation or abscess without bleeding: Secondary | ICD-10-CM | POA: Diagnosis not present

## 2014-09-29 DIAGNOSIS — Z09 Encounter for follow-up examination after completed treatment for conditions other than malignant neoplasm: Secondary | ICD-10-CM | POA: Diagnosis not present

## 2014-09-29 DIAGNOSIS — K640 First degree hemorrhoids: Secondary | ICD-10-CM | POA: Diagnosis not present

## 2014-10-02 DIAGNOSIS — H3531 Nonexudative age-related macular degeneration: Secondary | ICD-10-CM | POA: Diagnosis not present

## 2014-11-19 DIAGNOSIS — L299 Pruritus, unspecified: Secondary | ICD-10-CM | POA: Diagnosis not present

## 2014-11-19 DIAGNOSIS — Z79899 Other long term (current) drug therapy: Secondary | ICD-10-CM | POA: Diagnosis not present

## 2014-11-19 DIAGNOSIS — E039 Hypothyroidism, unspecified: Secondary | ICD-10-CM | POA: Diagnosis not present

## 2014-11-19 DIAGNOSIS — L989 Disorder of the skin and subcutaneous tissue, unspecified: Secondary | ICD-10-CM | POA: Diagnosis not present

## 2014-12-01 DIAGNOSIS — H3531 Nonexudative age-related macular degeneration: Secondary | ICD-10-CM | POA: Diagnosis not present

## 2014-12-16 DIAGNOSIS — L570 Actinic keratosis: Secondary | ICD-10-CM | POA: Diagnosis not present

## 2014-12-16 DIAGNOSIS — L821 Other seborrheic keratosis: Secondary | ICD-10-CM | POA: Diagnosis not present

## 2014-12-16 DIAGNOSIS — L309 Dermatitis, unspecified: Secondary | ICD-10-CM | POA: Diagnosis not present

## 2014-12-29 ENCOUNTER — Observation Stay (HOSPITAL_COMMUNITY): Payer: Medicare Other

## 2014-12-29 ENCOUNTER — Encounter (HOSPITAL_COMMUNITY): Payer: Self-pay | Admitting: *Deleted

## 2014-12-29 ENCOUNTER — Observation Stay (HOSPITAL_COMMUNITY)
Admission: EM | Admit: 2014-12-29 | Discharge: 2014-12-30 | Disposition: A | Discharge status: Home / Self Care | Payer: Medicare Other | Attending: Internal Medicine | Admitting: Internal Medicine

## 2014-12-29 ENCOUNTER — Emergency Department (HOSPITAL_COMMUNITY): Payer: Medicare Other

## 2014-12-29 DIAGNOSIS — G454 Transient global amnesia: Secondary | ICD-10-CM | POA: Diagnosis not present

## 2014-12-29 DIAGNOSIS — Z8673 Personal history of transient ischemic attack (TIA), and cerebral infarction without residual deficits: Secondary | ICD-10-CM | POA: Insufficient documentation

## 2014-12-29 DIAGNOSIS — Z87891 Personal history of nicotine dependence: Secondary | ICD-10-CM | POA: Diagnosis not present

## 2014-12-29 DIAGNOSIS — R269 Unspecified abnormalities of gait and mobility: Secondary | ICD-10-CM | POA: Diagnosis present

## 2014-12-29 DIAGNOSIS — I10 Essential (primary) hypertension: Secondary | ICD-10-CM | POA: Diagnosis not present

## 2014-12-29 DIAGNOSIS — Z79899 Other long term (current) drug therapy: Secondary | ICD-10-CM | POA: Insufficient documentation

## 2014-12-29 DIAGNOSIS — E785 Hyperlipidemia, unspecified: Secondary | ICD-10-CM | POA: Diagnosis not present

## 2014-12-29 DIAGNOSIS — H532 Diplopia: Secondary | ICD-10-CM | POA: Diagnosis not present

## 2014-12-29 DIAGNOSIS — R413 Other amnesia: Secondary | ICD-10-CM | POA: Diagnosis not present

## 2014-12-29 DIAGNOSIS — R42 Dizziness and giddiness: Secondary | ICD-10-CM | POA: Diagnosis not present

## 2014-12-29 DIAGNOSIS — R410 Disorientation, unspecified: Secondary | ICD-10-CM | POA: Diagnosis not present

## 2014-12-29 DIAGNOSIS — G459 Transient cerebral ischemic attack, unspecified: Principal | ICD-10-CM | POA: Diagnosis present

## 2014-12-29 DIAGNOSIS — E871 Hypo-osmolality and hyponatremia: Secondary | ICD-10-CM | POA: Diagnosis present

## 2014-12-29 DIAGNOSIS — R2681 Unsteadiness on feet: Secondary | ICD-10-CM | POA: Diagnosis not present

## 2014-12-29 DIAGNOSIS — R930 Abnormal findings on diagnostic imaging of skull and head, not elsewhere classified: Secondary | ICD-10-CM | POA: Diagnosis not present

## 2014-12-29 DIAGNOSIS — Z7982 Long term (current) use of aspirin: Secondary | ICD-10-CM | POA: Insufficient documentation

## 2014-12-29 HISTORY — DX: Essential (primary) hypertension: I10

## 2014-12-29 LAB — CBC
HEMATOCRIT: 38.9 % (ref 36.0–46.0)
HEMOGLOBIN: 13.8 g/dL (ref 12.0–15.0)
MCH: 31.2 pg (ref 26.0–34.0)
MCHC: 35.5 g/dL (ref 30.0–36.0)
MCV: 87.8 fL (ref 78.0–100.0)
PLATELETS: 337 10*3/uL (ref 150–400)
RBC: 4.43 MIL/uL (ref 3.87–5.11)
RDW: 13.5 % (ref 11.5–15.5)
WBC: 6.9 10*3/uL (ref 4.0–10.5)

## 2014-12-29 LAB — URINALYSIS, ROUTINE W REFLEX MICROSCOPIC
BILIRUBIN URINE: NEGATIVE
Glucose, UA: NEGATIVE mg/dL
Hgb urine dipstick: NEGATIVE
KETONES UR: NEGATIVE mg/dL
Leukocytes, UA: NEGATIVE
Nitrite: NEGATIVE
Protein, ur: NEGATIVE mg/dL
SPECIFIC GRAVITY, URINE: 1.006 (ref 1.005–1.030)
UROBILINOGEN UA: 0.2 mg/dL (ref 0.0–1.0)
pH: 7.5 (ref 5.0–8.0)

## 2014-12-29 LAB — DIFFERENTIAL
BASOS ABS: 0 10*3/uL (ref 0.0–0.1)
Basophils Relative: 0 % (ref 0–1)
Eosinophils Absolute: 0.1 10*3/uL (ref 0.0–0.7)
Eosinophils Relative: 2 % (ref 0–5)
LYMPHS ABS: 2.5 10*3/uL (ref 0.7–4.0)
Lymphocytes Relative: 37 % (ref 12–46)
MONOS PCT: 10 % (ref 3–12)
Monocytes Absolute: 0.7 10*3/uL (ref 0.1–1.0)
NEUTROS PCT: 51 % (ref 43–77)
Neutro Abs: 3.5 10*3/uL (ref 1.7–7.7)

## 2014-12-29 LAB — I-STAT CHEM 8, ED
BUN: 6 mg/dL (ref 6–20)
CHLORIDE: 91 mmol/L — AB (ref 101–111)
Calcium, Ion: 1.21 mmol/L (ref 1.13–1.30)
Creatinine, Ser: 0.7 mg/dL (ref 0.44–1.00)
GLUCOSE: 114 mg/dL — AB (ref 65–99)
HEMATOCRIT: 46 % (ref 36.0–46.0)
Hemoglobin: 15.6 g/dL — ABNORMAL HIGH (ref 12.0–15.0)
Potassium: 3.7 mmol/L (ref 3.5–5.1)
Sodium: 129 mmol/L — ABNORMAL LOW (ref 135–145)
TCO2: 27 mmol/L (ref 0–100)

## 2014-12-29 LAB — I-STAT TROPONIN, ED: TROPONIN I, POC: 0 ng/mL (ref 0.00–0.08)

## 2014-12-29 LAB — PROTIME-INR
INR: 0.94 (ref 0.00–1.49)
PROTHROMBIN TIME: 12.8 s (ref 11.6–15.2)

## 2014-12-29 LAB — COMPREHENSIVE METABOLIC PANEL
ALBUMIN: 4.4 g/dL (ref 3.5–5.0)
ALT: 29 U/L (ref 14–54)
AST: 32 U/L (ref 15–41)
Alkaline Phosphatase: 122 U/L (ref 38–126)
Anion gap: 10 (ref 5–15)
BUN: <5 mg/dL — ABNORMAL LOW (ref 6–20)
CHLORIDE: 92 mmol/L — AB (ref 101–111)
CO2: 28 mmol/L (ref 22–32)
CREATININE: 0.68 mg/dL (ref 0.44–1.00)
Calcium: 10 mg/dL (ref 8.9–10.3)
GFR calc non Af Amer: >60 mL/min (ref >60)
Glucose, Bld: 115 mg/dL — ABNORMAL HIGH (ref 65–99)
POTASSIUM: 4 mmol/L (ref 3.5–5.1)
Sodium: 130 mmol/L — ABNORMAL LOW (ref 135–145)
Total Bilirubin: 0.7 mg/dL (ref 0.3–1.2)
Total Protein: 6.7 g/dL (ref 6.5–8.1)

## 2014-12-29 LAB — TSH: TSH: 0.462 u[IU]/mL (ref 0.350–4.500)

## 2014-12-29 LAB — VITAMIN B12: Vitamin B-12: 631 pg/mL (ref 180–914)

## 2014-12-29 LAB — APTT: aPTT: 29 seconds (ref 24–37)

## 2014-12-29 LAB — SEDIMENTATION RATE: Sed Rate: 1 mm/hr (ref 0–22)

## 2014-12-29 LAB — FOLATE: FOLATE: 33 ng/mL (ref >5.9)

## 2014-12-29 MED ORDER — ENOXAPARIN SODIUM 40 MG/0.4ML ~~LOC~~ SOLN
40.0000 mg | SUBCUTANEOUS | Status: DC
  Administered 2014-12-29: 40 mg via SUBCUTANEOUS
  Filled 2014-12-29: qty 0.4

## 2014-12-29 MED ORDER — CALCIUM CARBONATE-VITAMIN D 500-200 MG-UNIT PO TABS
1.0000 | ORAL_TABLET | Freq: Every day | ORAL | Status: DC
  Administered 2014-12-30: 1 via ORAL
  Filled 2014-12-29: qty 1

## 2014-12-29 MED ORDER — VITAMIN A 7500 UNITS PO CAPS: 7500.0000 [IU] | ORAL_CAPSULE | Freq: Every day | ORAL | Status: DC

## 2014-12-29 MED ORDER — MAGNESIUM OXIDE 400 (241.3 MG) MG PO TABS
200.0000 mg | ORAL_TABLET | Freq: Every day | ORAL | Status: DC
  Administered 2014-12-29 – 2014-12-30 (×2): 200 mg via ORAL
  Filled 2014-12-29 (×2): qty 1

## 2014-12-29 MED ORDER — ADULT MULTIVITAMIN W/MINERALS CH
1.0000 | ORAL_TABLET | Freq: Every day | ORAL | Status: DC
  Administered 2014-12-29 – 2014-12-30 (×2): 1 via ORAL
  Filled 2014-12-29 (×2): qty 1

## 2014-12-29 MED ORDER — STROKE: EARLY STAGES OF RECOVERY BOOK
Freq: Once | Status: AC
  Administered 2014-12-29: 1

## 2014-12-29 MED ORDER — MAGNESIUM 200 MG PO TABS: 1.0000 | ORAL_TABLET | Freq: Every day | ORAL | Status: DC

## 2014-12-29 MED ORDER — B COMPLEX PO TABS: 1.0000 | ORAL_TABLET | Freq: Every day | ORAL | Status: DC

## 2014-12-29 MED ORDER — VITAMIN D 1000 UNITS PO TABS
1000.0000 [IU] | ORAL_TABLET | Freq: Every day | ORAL | Status: DC
  Administered 2014-12-29 – 2014-12-30 (×2): 1000 [IU] via ORAL
  Filled 2014-12-29 (×2): qty 1

## 2014-12-29 MED ORDER — LOSARTAN POTASSIUM 50 MG PO TABS
25.0000 mg | ORAL_TABLET | Freq: Every day | ORAL | Status: DC
  Administered 2014-12-29 – 2014-12-30 (×2): 25 mg via ORAL
  Filled 2014-12-29 (×2): qty 1

## 2014-12-29 MED ORDER — LORAZEPAM 2 MG/ML IJ SOLN: 1.0000 mg | Freq: Four times a day (QID) | INTRAMUSCULAR | Status: DC | PRN

## 2014-12-29 MED ORDER — SODIUM CHLORIDE 0.9 % IV BOLUS (SEPSIS)
500.0000 mL | Freq: Once | INTRAVENOUS | Status: AC
  Administered 2014-12-29: 500 mL via INTRAVENOUS

## 2014-12-29 MED ORDER — ASPIRIN 325 MG PO TABS
325.0000 mg | ORAL_TABLET | Freq: Every day | ORAL | Status: DC
  Administered 2014-12-29 – 2014-12-30 (×2): 325 mg via ORAL
  Filled 2014-12-29 (×2): qty 1

## 2014-12-29 MED ORDER — SENNOSIDES-DOCUSATE SODIUM 8.6-50 MG PO TABS: 1.0000 | ORAL_TABLET | Freq: Every evening | ORAL | Status: DC | PRN

## 2014-12-29 MED ORDER — VITAMIN B-1 100 MG PO TABS
100.0000 mg | ORAL_TABLET | Freq: Every day | ORAL | Status: DC
  Administered 2014-12-29 – 2014-12-30 (×2): 100 mg via ORAL
  Filled 2014-12-29 (×2): qty 1

## 2014-12-29 MED ORDER — LORAZEPAM 1 MG PO TABS: 1.0000 mg | ORAL_TABLET | Freq: Four times a day (QID) | ORAL | Status: DC | PRN

## 2014-12-29 MED ORDER — LEVOTHYROXINE SODIUM 112 MCG PO TABS
112.0000 ug | ORAL_TABLET | Freq: Every day | ORAL | Status: DC
Start: 2014-12-30 — End: 2014-12-30
  Administered 2014-12-30: 112 ug via ORAL
  Filled 2014-12-29: qty 1

## 2014-12-29 MED ORDER — DIAZEPAM 2 MG PO TABS
2.0000 mg | ORAL_TABLET | Freq: Once | ORAL | Status: AC
  Administered 2014-12-29: 2 mg via ORAL
  Filled 2014-12-29: qty 1

## 2014-12-29 MED ORDER — VITAMIN D (CHOLECALCIFEROL) 25 MCG (1000 UT) PO TABS: 1.0000 | ORAL_TABLET | Freq: Every day | ORAL | Status: DC

## 2014-12-29 MED ORDER — FOLIC ACID 1 MG PO TABS
1.0000 mg | ORAL_TABLET | Freq: Every day | ORAL | Status: DC
  Administered 2014-12-29 – 2014-12-30 (×2): 1 mg via ORAL
  Filled 2014-12-29 (×2): qty 1

## 2014-12-29 MED ORDER — THIAMINE HCL 100 MG/ML IJ SOLN
100.0000 mg | Freq: Every day | INTRAMUSCULAR | Status: DC
  Filled 2014-12-29: qty 2

## 2014-12-29 MED ORDER — B COMPLEX-C PO TABS
1.0000 | ORAL_TABLET | Freq: Every day | ORAL | Status: DC
  Administered 2014-12-30: 1 via ORAL
  Filled 2014-12-29 (×2): qty 1

## 2014-12-29 MED ORDER — ATORVASTATIN CALCIUM 10 MG PO TABS
20.0000 mg | ORAL_TABLET | Freq: Every day | ORAL | Status: DC
  Administered 2014-12-29 – 2014-12-30 (×2): 20 mg via ORAL
  Filled 2014-12-29 (×2): qty 2

## 2014-12-29 NOTE — ED Notes (Signed)
Neurology at bedside.

## 2014-12-29 NOTE — ED Notes (Signed)
PT states she started having stroke symptoms yesterday afternoon.  Pt states while walking she felt dizzy and disoriented with double vision.  Pt went home and just felt exhausted.  The double vision went away at some point. Pt states mild posterior headache.  Pt has no facial deficits and no extremity deficits.  Pt states she did not fall, but broke fall by catching chair.

## 2014-12-29 NOTE — ED Notes (Signed)
Dr Dhungel at bedside 

## 2014-12-29 NOTE — H&P (Addendum)
[facility] History and Physical  Diane Chambers VVO:[id number] DOB: [birth year] DOA: 12/29/2014  Referring physician: Dr. Ralene Bathe PCP: Antony Blackbird, MD   Chief Complaint: Unsteady gait, diplopia 1 day Confusion 1 day  HPI:  81 year old female with history of multiple TIAs (4 prior episodes, with workup done in [place]), nonsignificant carotid stenosis who presents to the ED with diplopia and unsteady gait since yesterday. Patient reported going out the park for a walk and on returning home had a diplopia in both eyes. This was followed by unsteady gait. She does not remember a lot of incidents from yesterday (apart from having a small glass of wine and watching the Olympics for about half an are). This morning when she woke up she noticed that the foot was still in the microwave and she was not able to recall several incidents from last evening. Denies any slurred speech, weakness of arms or legs. She denies any bowel or urinary incontinence. She is not sure if she had a fall. Patient denies headache, dizziness, fever, chills, nausea , vomiting, chest pain, palpitations, SOB, abdominal pain, bowel or urinary symptoms. Denies change in weight or appetite. She reports having 4 prior episodes of TIAs with similar symptoms and has been on full dose aspirin for several years. Last episode of TIA was 5-6 years ago in [place].  Review of Systems:  Constitutional: Denies fever, chills, diaphoresis, appetite change and fatigue.  HEENT: Diplopia++, denies eye pain, hearing loss, tinnitus, ear pain, congestion, sore throat, difficulty swallowing, neck pain or stiffness  Respiratory: Denies SOB, DOE, cough, chest tightness,  and wheezing.   Cardiovascular: Denies chest pain, palpitations and leg swelling.  Gastrointestinal: Denies nausea, vomiting, abdominal pain, diarrhea, constipation, blood in stool and abdominal distention.  Genitourinary: Denies dysuria, , hematuria, flank pain and difficulty  urinating.  Endocrine: Denies: hot or cold intolerance,  polyuria, polydipsia. Musculoskeletal: Denies myalgias, back pain, joint pain or swelling Skin: Denies pallor, rash and wound.  Neurological: ? Syncope, unsteady gait, Denies dizziness, seizures, weakness, light-headedness, numbness and headaches.  Hematological: Denies adenopathy.  Psychiatric/Behavioral: Confusion +,  Past Medical History  Diagnosis Date  . Hypertension   . TIA (transient ischemic attack)     times 4   Past Surgical History  Procedure Laterality Date  . Cholecystectomy    . Fracture surgery    . Joint replacement      right knee   Social History:  reports that she has quit smoking. She does not have any smokeless tobacco history on file. She reports that she drinks alcohol. She reports that she does not use illicit drugs.  No Known Allergies  No family history on file.  Prior to Admission medications   Medication Sig Start Date End Date Taking? Authorizing Provider  aspirin 325 MG tablet Take 325 mg by mouth daily.   Yes Historical Provider, MD  atorvastatin (LIPITOR) 20 MG tablet Take 20 mg by mouth daily.   Yes Historical Provider, MD  b complex vitamins tablet Take 1 tablet by mouth daily.   Yes Historical Provider, MD  calcium-vitamin D (OSCAL WITH D) 500-200 MG-UNIT per tablet Take 1 tablet by mouth daily with breakfast.   Yes Historical Provider, MD  hydrochlorothiazide (HYDRODIURIL) 25 MG tablet Take 25 mg by mouth daily.   Yes Historical Provider, MD  levothyroxine (SYNTHROID, LEVOTHROID) 112 MCG tablet Take 112 mcg by mouth daily before breakfast.   Yes Historical Provider, MD  losartan (COZAAR) 25 MG tablet Take 25 mg  by mouth daily.   Yes Historical Provider, MD  MAGNESIUM PO Take by mouth daily.   Yes Historical Provider, MD  traZODone (DESYREL) 50 MG tablet Take 25 mg by mouth daily.   Yes Historical Provider, MD  VITAMIN A PO Take by mouth daily.   Yes Historical Provider, MD  VITAMIN D,  CHOLECALCIFEROL, PO Take by mouth daily.   Yes Historical Provider, MD  VITAMIN E PO Take by mouth daily.   Yes Historical Provider, MD     Physical Exam:  Filed Vitals:   12/29/14 1221 12/29/14 1500  BP: 190/93 198/72  Pulse: 79 80  Temp: 98.3 F (36.8 C)   TempSrc: Oral   Resp: 18   SpO2: 96% 99%    Constitutional: Vital signs reviewed. Early female in no acute distress HEENT: no pallor, no icterus, moist oral mucosa, no cervical lymphadenopathy Cardiovascular: RRR, S1 normal, S2 normal, no MRG Chest: CTAB, no wheezes, rales, or rhonchi Abdominal: Soft. Non-tender, non-distended, bowel sounds are normal Ext: warm, no edema Neurological: Alert and oriented 3, cranial nose 2-12 intact, normal motor power and reflexes, normal sensations, gait not assessed  Labs on Admission:  Basic Metabolic Panel:  Recent Labs Lab 12/29/14 1247 12/29/14 1311  NA 130* 129*  K 4.0 3.7  CL 92* 91*  CO2 28  --   GLUCOSE 115* 114*  BUN <5* 6  CREATININE 0.68 0.70  CALCIUM 10.0  --    Liver Function Tests:  Recent Labs Lab 12/29/14 1247  AST 32  ALT 29  ALKPHOS 122  BILITOT 0.7  PROT 6.7  ALBUMIN 4.4   No results for input(s): LIPASE, AMYLASE in the last 168 hours. No results for input(s): AMMONIA in the last 168 hours. CBC:  Recent Labs Lab 12/29/14 1247 12/29/14 1311  WBC 6.9  --   NEUTROABS 3.5  --   HGB 13.8 15.6*  HCT 38.9 46.0  MCV 87.8  --   PLT 337  --    Cardiac Enzymes: No results for input(s): CKTOTAL, CKMB, CKMBINDEX, TROPONINI in the last 168 hours. BNP: Invalid input(s): POCBNP CBG: No results for input(s): GLUCAP in the last 168 hours.  Radiological Exams on Admission: Ct Head Wo Contrast  12/29/2014   CLINICAL DATA:  81 year old female with dizziness, disorientation and double vision beginning yesterday afternoon  EXAM: CT HEAD WITHOUT CONTRAST  TECHNIQUE: Contiguous axial images were obtained from the base of the skull through the vertex without  intravenous contrast.  COMPARISON:  Prior carotid ultrasound evaluation 02/14/2014  FINDINGS: Negative for acute intracranial hemorrhage, acute infarction, mass, mass effect, hydrocephalus or midline shift. Gray-white differentiation is preserved throughout. Mild cerebral cortical atrophy. Scattered periventricular, subcortical and deep white matter hypoattenuation which is nonspecific but most consistent with the sequelae of chronic microvascular ischemic white matter disease. No focal soft tissue or calvarial abnormality. Globes and orbits are symmetric and intact bilaterally. Status post bilateral lens extractions. Normal aeration of the mastoid air cells and paranasal sinuses. Atherosclerotic calcifications present in both cavernous carotid arteries.  IMPRESSION: 1. No acute intracranial abnormality. 2. Mild atrophy and chronic microvascular ischemic white matter disease.   Electronically Signed   By: Jacqulynn Cadet M.D.   On: 12/29/2014 14:02    EKG: Sinus rhythm at 71 with incomplete right bundle branch block  Assessment/Plan Principal Problem:   TIA (transient ischemic attack)  Active Problems:   Confusion   Essential hypertension   Hyponatremia      Transient ischemic attack/? Acute  CVA Admit to telemetry  neuro checks per unit protocol Head CT on admission negative for acute event. Ordered MRI brain, MRA head. -Check 2D echo. Repeat carotid doppler, A1C and lipid panel -swallow eval, PT, OT. -patient on full dose aspirin at home. Neurology consulted. May need to switch her to Plavix. Continue statin.  Essential hypertension A low permissive blood pressure. Resume home blood pressure medications.  Hyponatremia Possibly secondary to dehydration vs SIADH .check urine osm and urine Na. Place on fluid restriction  and monitor..  Alcohol use Reports drinking a small glass of wine everyday but I suspect that she may be drinking more. Will monitor on CIWA.  Diet:cardiac  DVT  prophylaxis: sq lovenox   Code Status: full code Family Communication:  None at bedside Disposition Plan: admit to telemetry  Kharson Rasmusson, [facility] Pager [phone]  Total time spent on admission :60 minutes  If 7PM-7AM, please contact night-coverage [email or web address] Password [email or web address] 12/29/2014, 4:02 PM

## 2014-12-29 NOTE — Consult Note (Signed)
Referring Physician: Valinda Party    Chief Complaint: TIA AND PERIOD OF AMNESIA  HPI:                                                                                                                                         Diane Chambers is an 81 y.o. female who states she has had multiple TIA in the past.  All of which had generalized weakness and a amnestic period.  Patient last night went for a walk around 5 but is not exact on the time. She noted she became generally weak in all extremities then noted she had horizontal double vision.  This double vision lasted for a bout 5-6 hours.  She cannot recall events of the evening but daughter in law notes she made a phone call to her son and was noted to be slurring her words. Patient notes she put a dinner in the microwave but never at it.    Patient admits to taking either a trazodone or sleeping pill but is unsure when she took it.  She also admits to drinking last night but is not clear how much.  Daughter in law is worried about dementia and mixing of medications.  She is also concerned about how much patient may be drinking and not stating the truth.    Date last known well: Date: 12/28/2014 Time last known well: Unable to determine tPA Given: No: no symptoms at present time     Past Medical History  Diagnosis Date  . Hypertension   . TIA (transient ischemic attack)     times 4    Past Surgical History  Procedure Laterality Date  . Cholecystectomy    . Fracture surgery    . Joint replacement      right knee    Family History  Problem Relation Age of Onset  . Diabetes Mother   . Hyperlipidemia Father    Social History:  reports that she has quit smoking. She does not have any smokeless tobacco history on file. She reports that she drinks alcohol. She reports that she does not use illicit drugs.  Allergies: No Known Allergies  Medications:                                                                                                                            No current facility-administered  medications for this encounter.   Current Outpatient Prescriptions  Medication Sig Dispense Refill  . aspirin 325 MG tablet Take 325 mg by mouth daily.    Marland Kitchen atorvastatin (LIPITOR) 20 MG tablet Take 20 mg by mouth daily.    Marland Kitchen b complex vitamins tablet Take 1 tablet by mouth daily.    . calcium-vitamin D (OSCAL WITH D) 500-200 MG-UNIT per tablet Take 1 tablet by mouth daily with breakfast.    . hydrochlorothiazide (HYDRODIURIL) 25 MG tablet Take 25 mg by mouth daily.    Marland Kitchen levothyroxine (SYNTHROID, LEVOTHROID) 112 MCG tablet Take 112 mcg by mouth daily before breakfast.    . losartan (COZAAR) 25 MG tablet Take 25 mg by mouth daily.    Marland Kitchen MAGNESIUM PO Take by mouth daily.    . traZODone (DESYREL) 50 MG tablet Take 25 mg by mouth daily.    Marland Kitchen VITAMIN A PO Take by mouth daily.    Marland Kitchen VITAMIN D, CHOLECALCIFEROL, PO Take by mouth daily.    Marland Kitchen VITAMIN E PO Take by mouth daily.       ROS:                                                                                                                                       History obtained from the patient  General ROS: negative for - chills, fatigue, fever, night sweats, weight gain or weight loss Psychological ROS: negative for - behavioral disorder, hallucinations, memory difficulties, mood swings or suicidal ideation Ophthalmic ROS: negative for - blurry vision, double vision, eye pain or loss of vision ENT ROS: negative for - epistaxis, nasal discharge, oral lesions, sore throat, tinnitus or vertigo Allergy and Immunology ROS: negative for - hives or itchy/watery eyes Hematological and Lymphatic ROS: negative for - bleeding problems, bruising or swollen lymph nodes Endocrine ROS: negative for - galactorrhea, hair pattern changes, polydipsia/polyuria or temperature intolerance Respiratory ROS: negative for - cough, hemoptysis, shortness of breath or wheezing Cardiovascular ROS: negative  for - chest pain, dyspnea on exertion, edema or irregular heartbeat Gastrointestinal ROS: negative for - abdominal pain, diarrhea, hematemesis, nausea/vomiting or stool incontinence Genito-Urinary ROS: negative for - dysuria, hematuria, incontinence or urinary frequency/urgency Musculoskeletal ROS: negative for - joint swelling or muscular weakness Neurological ROS: as noted in HPI Dermatological ROS: negative for rash and skin lesion changes  Neurologic Examination:  Blood pressure 179/69, pulse 85, temperature 98.3 F (36.8 C), temperature source Oral, resp. rate 20, SpO2 97 %.  HEENT-  Normocephalic, no lesions, without obvious abnormality.  Normal external eye and conjunctiva.  Normal TM's bilaterally.  Normal auditory canals and external ears. Normal external nose, mucus membranes and septum.  Normal pharynx. Cardiovascular- S1, S2 normal, pulses palpable throughout   Lungs- chest clear, no wheezing, rales, normal symmetric air entry Abdomen- normal findings: bowel sounds normal Extremities- no edema Lymph-no adenopathy palpable Musculoskeletal-no joint tenderness, deformity or swelling Skin-warm and dry, no hyperpigmentation, vitiligo, or suspicious lesions  Neurological Examination Mental Status: Alert, oriented, thought content appropriate.  Speech fluent without evidence of aphasia.  Able to follow 3 step commands without difficulty. Cranial Nerves: II: Discs flat bilaterally; Visual fields grossly normal, pupils equal, round, reactive to light and accommodation III,IV, VI: ptosis not present, extra-ocular motions intact bilaterally V,VII: smile symmetric, facial light touch sensation normal bilaterally VIII: hearing normal bilaterally IX,X: uvula rises symmetrically XI: bilateral shoulder shrug XII: midline tongue extension Motor: Right : Upper extremity   5/5    Left:      Upper extremity   5/5  Lower extremity   5/5     Lower extremity   5/5 Tone and bulk:normal tone throughout; no atrophy noted Sensory: Pinprick and light touch intact throughout, bilaterally Deep Tendon Reflexes: 2+ and symmetric throughout Plantars: Right: downgoing   Left: downgoing Cerebellar: normal finger-to-nose, normal rapid alternating movements and normal heel-to-shin test Gait: normal gait and station       Lab Results: Basic Metabolic Panel:  Recent Labs Lab 12/29/14 1247 12/29/14 1311  NA 130* 129*  K 4.0 3.7  CL 92* 91*  CO2 28  --   GLUCOSE 115* 114*  BUN <5* 6  CREATININE 0.68 0.70  CALCIUM 10.0  --     Liver Function Tests:  Recent Labs Lab 12/29/14 1247  AST 32  ALT 29  ALKPHOS 122  BILITOT 0.7  PROT 6.7  ALBUMIN 4.4   No results for input(s): LIPASE, AMYLASE in the last 168 hours. No results for input(s): AMMONIA in the last 168 hours.  CBC:  Recent Labs Lab 12/29/14 1247 12/29/14 1311  WBC 6.9  --   NEUTROABS 3.5  --   HGB 13.8 15.6*  HCT 38.9 46.0  MCV 87.8  --   PLT 337  --     Cardiac Enzymes: No results for input(s): CKTOTAL, CKMB, CKMBINDEX, TROPONINI in the last 168 hours.  Lipid Panel: No results for input(s): CHOL, TRIG, HDL, CHOLHDL, VLDL, LDLCALC in the last 168 hours.  CBG: No results for input(s): GLUCAP in the last 168 hours.  Microbiology: No results found for this or any previous visit.  Coagulation Studies:  Recent Labs  12/29/14 1247  LABPROT 12.8  INR 0.94    Imaging: Ct Head Wo Contrast  12/29/2014   CLINICAL DATA:  81 year old female with dizziness, disorientation and double vision beginning yesterday afternoon  EXAM: CT HEAD WITHOUT CONTRAST  TECHNIQUE: Contiguous axial images were obtained from the base of the skull through the vertex without intravenous contrast.  COMPARISON:  Prior carotid ultrasound evaluation 02/14/2014  FINDINGS: Negative for acute intracranial hemorrhage, acute  infarction, mass, mass effect, hydrocephalus or midline shift. Gray-white differentiation is preserved throughout. Mild cerebral cortical atrophy. Scattered periventricular, subcortical and deep white matter hypoattenuation which is nonspecific but most consistent with the sequelae of chronic microvascular ischemic white matter disease. No focal soft tissue or  calvarial abnormality. Globes and orbits are symmetric and intact bilaterally. Status post bilateral lens extractions. Normal aeration of the mastoid air cells and paranasal sinuses. Atherosclerotic calcifications present in both cavernous carotid arteries.  IMPRESSION: 1. No acute intracranial abnormality. 2. Mild atrophy and chronic microvascular ischemic white matter disease.   Electronically Signed   By: Jacqulynn Cadet M.D.   On: 12/29/2014 14:02   Assessment and plan discussed with with attending physician and they are in agreement.    Etta Quill PA-C [facility] [phone]  12/29/2014, 4:44 PM   Assessment: 81 y.o. female presenting with transient diplopia and generalized weakness. IT is unclear if this may be a TIA versus medication/ETOH SE. There is also concern of a component of cognitive decline.   Stroke Risk Factors - hypertension  Recommend: 1) TIA work up 2) EEG 3) B12, folate, RPR,   We will continue to follow this patient with you.  I personally participated in this patient's evaluation and management, including formulating the above clinical impression and management recommendations.  Rush Farmer M.D. [facility] [phone]

## 2014-12-29 NOTE — ED Notes (Signed)
Belongings labeled and taken to MRI by Network engineer.  MRI notified.

## 2014-12-29 NOTE — ED Notes (Signed)
Attempted report 

## 2014-12-29 NOTE — ED Notes (Signed)
Attempted report.  Secretary stated that all beds were dirty.  Flow notified.

## 2014-12-29 NOTE — ED Notes (Signed)
Pt to MRI

## 2014-12-29 NOTE — ED Provider Notes (Signed)
CSN: [id number]     Arrival date & time 12/29/14  1206 History   First MD Initiated Contact with Patient 12/29/14 1230     Chief Complaint  Patient presents with  . Stroke Symptoms     The history is provided by the patient. No language interpreter was used.   Ms. Borrelli presents for evaluation of strokelike symptoms. Yesterday when she went for her walk around 5:30 in the afternoon she felt generalized weakness and felt off balance with her gait and unsteady. When she arrived at home she had double vision. She does not remember all of the evening and feels like she can't account for all that happened last night. She did fall at one time but was caught by a chair and did not hit her head. Symptoms lasted for 4-5 hours. When she awoke today she felt generalized weakness and unwell but she is not having double vision, numbness, focal weakness. She has a history of TIA, hypertension, hyperlipidemia. Symptoms are moderate and waxing and waning and improving.  Past Medical History  Diagnosis Date  . Hypertension   . TIA (transient ischemic attack)     times 4   Past Surgical History  Procedure Laterality Date  . Cholecystectomy    . Fracture surgery    . Joint replacement      right knee   No family history on file. History  Substance Use Topics  . Smoking status: Former Research scientist (life sciences)  . Smokeless tobacco: Not on file  . Alcohol Use: Yes     Comment: 1 drink per night   OB History    No data available     Review of Systems  All other systems reviewed and are negative.     Allergies  Review of patient's allergies indicates no known allergies.  Home Medications   Prior to Admission medications   Not on File   BP 190/93 mmHg  Pulse 79  Temp(Src) 98.3 F (36.8 C) (Oral)  Resp 18  SpO2 96% Physical Exam  Constitutional: She is oriented to person, place, and time. She appears well-developed and well-nourished.  HENT:  Head: Normocephalic and atraumatic.  Eyes: EOM are  normal. Pupils are equal, round, and reactive to light.  Cardiovascular: Normal rate and regular rhythm.   No murmur heard. Pulmonary/Chest: Effort normal and breath sounds normal. No respiratory distress.  Abdominal: Soft. There is no tenderness. There is no rebound and no guarding.  Musculoskeletal: She exhibits no edema or tenderness.  Neurological: She is alert and oriented to person, place, and time. No cranial nerve deficit. Coordination normal.  Skin: Skin is warm and dry.  Psychiatric: She has a normal mood and affect. Her behavior is normal.  Nursing note and vitals reviewed.   ED Course  Procedures (including critical care time) Labs Review Labs Reviewed  COMPREHENSIVE METABOLIC PANEL - Abnormal; Notable for the following:    Sodium 130 (*)    Chloride 92 (*)    Glucose, Bld 115 (*)    BUN <5 (*)    All other components within normal limits  I-STAT CHEM 8, ED - Abnormal; Notable for the following:    Sodium 129 (*)    Chloride 91 (*)    Glucose, Bld 114 (*)    Hemoglobin 15.6 (*)    All other components within normal limits  PROTIME-INR  APTT  CBC  DIFFERENTIAL  URINALYSIS, ROUTINE W REFLEX MICROSCOPIC (NOT AT [facility])  I-STAT TROPOININ, ED  CBG MONITORING, ED  Imaging Review Ct Head Wo Contrast  12/29/2014   CLINICAL DATA:  81 year old female with dizziness, disorientation and double vision beginning yesterday afternoon  EXAM: CT HEAD WITHOUT CONTRAST  TECHNIQUE: Contiguous axial images were obtained from the base of the skull through the vertex without intravenous contrast.  COMPARISON:  Prior carotid ultrasound evaluation 02/14/2014  FINDINGS: Negative for acute intracranial hemorrhage, acute infarction, mass, mass effect, hydrocephalus or midline shift. Gray-white differentiation is preserved throughout. Mild cerebral cortical atrophy. Scattered periventricular, subcortical and deep white matter hypoattenuation which is nonspecific but most consistent with the  sequelae of chronic microvascular ischemic white matter disease. No focal soft tissue or calvarial abnormality. Globes and orbits are symmetric and intact bilaterally. Status post bilateral lens extractions. Normal aeration of the mastoid air cells and paranasal sinuses. Atherosclerotic calcifications present in both cavernous carotid arteries.  IMPRESSION: 1. No acute intracranial abnormality. 2. Mild atrophy and chronic microvascular ischemic white matter disease.   Electronically Signed   By: Jacqulynn Cadet M.D.   On: 12/29/2014 14:02     EKG Interpretation   Date/Time:  Monday December 29 2014 12:23:52 EDT Ventricular Rate:  71 PR Interval:  180 QRS Duration: 110 QT Interval:  410 QTC Calculation: 445 R Axis:   62 Text Interpretation:  Normal sinus rhythm Incomplete right bundle branch  block Borderline ECG Confirmed by Hazle Coca [phone]) on 12/29/2014 12:31:02  PM      MDM   Final diagnoses:  Transient cerebral ischemia, unspecified transient cerebral ischemia type    Patient here for evaluation of confusion, forgetfulness, diplopia that was yesterday, currently resolved. She has a history of TIA but no recent workup(greater than 79 years ago at another hospital). Nonfocal neurologic examination department. Plan to admit for TIA workup. Discussed with hospitalist regarding admission.    Quintella Reichert, MD 12/29/14 [phone]

## 2014-12-29 NOTE — ED Notes (Signed)
Per family, pt drinks a lot of etoh.  When family moved her they found many empty alcohol bottles.  Pt also states she took a trazadone while drinking last night.

## 2014-12-30 ENCOUNTER — Observation Stay (HOSPITAL_COMMUNITY): Payer: Medicare Other

## 2014-12-30 DIAGNOSIS — I10 Essential (primary) hypertension: Secondary | ICD-10-CM | POA: Diagnosis not present

## 2014-12-30 DIAGNOSIS — T887XXS Unspecified adverse effect of drug or medicament, sequela: Secondary | ICD-10-CM

## 2014-12-30 DIAGNOSIS — R4182 Altered mental status, unspecified: Secondary | ICD-10-CM

## 2014-12-30 DIAGNOSIS — R410 Disorientation, unspecified: Secondary | ICD-10-CM | POA: Diagnosis not present

## 2014-12-30 DIAGNOSIS — E871 Hypo-osmolality and hyponatremia: Secondary | ICD-10-CM

## 2014-12-30 DIAGNOSIS — G459 Transient cerebral ischemic attack, unspecified: Secondary | ICD-10-CM

## 2014-12-30 LAB — BASIC METABOLIC PANEL
ANION GAP: 11 (ref 5–15)
BUN: 8 mg/dL (ref 6–20)
CHLORIDE: 96 mmol/L — AB (ref 101–111)
CO2: 29 mmol/L (ref 22–32)
CREATININE: 0.9 mg/dL (ref 0.44–1.00)
Calcium: 10.9 mg/dL — ABNORMAL HIGH (ref 8.9–10.3)
GFR calc Af Amer: >60 mL/min (ref >60)
GFR calc non Af Amer: 59 mL/min — ABNORMAL LOW (ref >60)
Glucose, Bld: 124 mg/dL — ABNORMAL HIGH (ref 65–99)
Potassium: 3.4 mmol/L — ABNORMAL LOW (ref 3.5–5.1)
Sodium: 136 mmol/L (ref 135–145)

## 2014-12-30 LAB — LIPID PANEL
CHOLESTEROL: 215 mg/dL — AB (ref 0–200)
HDL: 103 mg/dL (ref >40)
LDL Cholesterol: 97 mg/dL (ref 0–99)
TRIGLYCERIDES: 77 mg/dL (ref <150)
Total CHOL/HDL Ratio: 2.1 RATIO
VLDL: 15 mg/dL (ref 0–40)

## 2014-12-30 LAB — RPR: RPR Ser Ql: NONREACTIVE

## 2014-12-30 MED ORDER — RISAQUAD PO CAPS
1.0000 | ORAL_CAPSULE | Freq: Every day | ORAL | Status: DC
  Administered 2014-12-30: 1 via ORAL
  Filled 2014-12-30: qty 1

## 2014-12-30 MED ORDER — PANTOPRAZOLE SODIUM 40 MG PO TBEC: 40.0000 mg | DELAYED_RELEASE_TABLET | Freq: Every day | ORAL | Status: DC

## 2014-12-30 MED ORDER — TRAZODONE HCL 50 MG PO TABS
25.0000 mg | ORAL_TABLET | Freq: Every evening | ORAL | Status: DC | PRN
Start: 2014-12-30 — End: 2024-01-14

## 2014-12-30 MED ORDER — FOLIC ACID 1 MG PO TABS: 1.0000 mg | ORAL_TABLET | Freq: Every day | ORAL | Status: DC

## 2014-12-30 NOTE — Discharge Summary (Addendum)
Physician Discharge Summary  Diane Chambers GTX:[id number] DOB: Jan 06, 1934 DOA: 12/29/2014  PCP: Antony Blackbird, MD  Admit date: 12/29/2014 Discharge date: 12/30/2014  Time spent: 35 minutes  Recommendations for Outpatient Follow-up:  1. Check BP- needs further titration 2. Stop ambien 3. Consider SSRI for depression 4. Home health   Discharge Diagnoses:  Principal Problem:   TIA (transient ischemic attack) Active Problems:   Confusion   Essential hypertension   Hyponatremia   Discharge Condition: improved  Diet recommendation: cardiac  Filed Weights   12/29/14 2003  Weight: 62.596 kg (138 lb)    History of present illness:  81 year old female with history of multiple TIAs (4 prior episodes, with workup done in [place]), nonsignificant carotid stenosis who presents to the ED with diplopia and unsteady gait since yesterday. Patient reported going out the park for a walk and on returning home had a diplopia in both eyes. This was followed by unsteady gait. She does not remember a lot of incidents from yesterday (apart from having a small glass of wine and watching the Olympics for about half an are). This morning when she woke up she noticed that the foot was still in the microwave and she was not able to recall several incidents from last evening. Denies any slurred speech, weakness of arms or legs. She denies any bowel or urinary incontinence. She is not sure if she had a fall. Patient denies headache, dizziness, fever, chills, nausea , vomiting, chest pain, palpitations, SOB, abdominal pain, bowel or urinary symptoms. Denies change in weight or appetite. She reports having 4 prior episodes of TIAs with similar symptoms and has been on full dose aspirin for several years. Last episode of TIA was 5-6 years ago in [place].  Hospital Course:  TIA vs medication/alcohol interaction- patient takes trazadone, ambien and alcohol together. TSH, B12, RPR Echo ok  - has outpatient follow up  at [facility] -no suicidal ideations  HTN- continue current management -may need further titration    Procedures:  echo  Consultations:  neuro  Discharge Exam: Filed Vitals:   12/30/14 1329  BP: 165/79  Pulse: 66  Temp: 98.6 F (37 C)  Resp: 18    General: NAD, A+Ox3   Discharge Instructions   Discharge Instructions    Diet - low sodium heart healthy    Complete by:  As directed      Discharge instructions    Complete by:  As directed   No driving after alcohol or trazadone Stop ambien     Increase activity slowly    Complete by:  As directed           Current Discharge Medication List    START taking these medications   Details  folic acid (FOLVITE) 1 MG tablet Take 1 tablet (1 mg total) by mouth daily. Qty: 30 tablet, Refills: 0      CONTINUE these medications which have CHANGED   Details  traZODone (DESYREL) 50 MG tablet Take 0.5 tablets (25 mg total) by mouth at bedtime as needed for sleep.      CONTINUE these medications which have NOT CHANGED   Details  aspirin 325 MG tablet Take 325 mg by mouth daily.    atorvastatin (LIPITOR) 20 MG tablet Take 20 mg by mouth daily.    b complex vitamins tablet Take 1 tablet by mouth daily.    calcium-vitamin D (OSCAL WITH D) 500-200 MG-UNIT per tablet Take 1 tablet by mouth daily with breakfast.  hydrochlorothiazide (HYDRODIURIL) 25 MG tablet Take 25 mg by mouth daily.    levothyroxine (SYNTHROID, LEVOTHROID) 112 MCG tablet Take 112 mcg by mouth daily before breakfast.    losartan (COZAAR) 25 MG tablet Take 25 mg by mouth daily.    MAGNESIUM PO Take by mouth daily.    VITAMIN A PO Take by mouth daily.    VITAMIN D, CHOLECALCIFEROL, PO Take by mouth daily.    VITAMIN E PO Take by mouth daily.       No Known Allergies Follow-up Information    Follow up with FULP, CAMMIE, MD In 1 week.   Specialty:  Family Medicine   Why:  BP check and start SSRI   Contact information:   [address] [place] [address] [phone]        The results of significant diagnostics from this hospitalization (including imaging, microbiology, ancillary and laboratory) are listed below for reference.    Significant Diagnostic Studies: Ct Head Wo Contrast  12/29/2014   CLINICAL DATA:  81 year old female with dizziness, disorientation and double vision beginning yesterday afternoon  EXAM: CT HEAD WITHOUT CONTRAST  TECHNIQUE: Contiguous axial images were obtained from the base of the skull through the vertex without intravenous contrast.  COMPARISON:  Prior carotid ultrasound evaluation 02/14/2014  FINDINGS: Negative for acute intracranial hemorrhage, acute infarction, mass, mass effect, hydrocephalus or midline shift. Gray-white differentiation is preserved throughout. Mild cerebral cortical atrophy. Scattered periventricular, subcortical and deep white matter hypoattenuation which is nonspecific but most consistent with the sequelae of chronic microvascular ischemic white matter disease. No focal soft tissue or calvarial abnormality. Globes and orbits are symmetric and intact bilaterally. Status post bilateral lens extractions. Normal aeration of the mastoid air cells and paranasal sinuses. Atherosclerotic calcifications present in both cavernous carotid arteries.  IMPRESSION: 1. No acute intracranial abnormality. 2. Mild atrophy and chronic microvascular ischemic white matter disease.   Electronically Signed   By: Jacqulynn Cadet M.D.   On: 12/29/2014 14:02   Mr Brain Wo Contrast  12/29/2014   CLINICAL DATA:  81 year old female with generalized weakness, horizontal double vision, amnesia. Some symptom resolution. Previous TIA. Initial encounter.  EXAM: MRI HEAD WITHOUT CONTRAST  MRA HEAD WITHOUT CONTRAST  TECHNIQUE: Multiplanar, multiecho pulse sequences of the brain and surrounding structures were obtained without intravenous contrast. Angiographic images of the head were obtained using MRA  technique without contrast.  COMPARISON:  Head CT without contrast 1320 hr today.  FINDINGS: MRI HEAD FINDINGS  Cerebral volume is within normal limits for age. No restricted diffusion to suggest acute infarction. No midline shift, mass effect, evidence of mass lesion, ventriculomegaly, extra-axial collection or acute intracranial hemorrhage. Cervicomedullary junction and pituitary are within normal limits. Major intracranial vascular flow voids are within normal limits, dominant distal left vertebral artery.  Mild for age patchy nonspecific cerebral white matter T2 and FLAIR hyperintensity. No cortical encephalomalacia or chronic cerebral blood products identified. Deep gray matter nuclei, brainstem, and cerebellum are within normal limits for age.  Visible internal auditory structures appear normal. Mastoids are clear. Trace paranasal sinus mucosal thickening. Postoperative changes to both globes, otherwise negative orbits soft tissues. Negative scalp soft tissues. Normal bone marrow signal. Negative visualized cervical spine.  MRA HEAD FINDINGS  Antegrade flow in the posterior circulation with dominant distal left vertebral artery, the right functionally terminates in PICA. Normal left PICA origin. Normal basilar artery aside from mild tortuosity. SCA and PCA origins are within normal limits. Both posterior communicating arteries are  present. Bilateral PCA branches are normal.  Antegrade flow in both ICA siphons. No siphon stenosis. Bilateral ophthalmic and posterior communicating artery origins are normal. Normal carotid termini, MCA and ACA origins. Anterior communicating artery and visualized bilateral ACA branches are within normal limits. Bilateral MCA M1 segments are normal. Visualized bilateral MCA branches are within normal limits.  IMPRESSION: 1. No acute intracranial abnormality. Mild for age nonspecific cerebral white matter signal changes. 2.  Negative intracranial MRA.   Electronically Signed   By: Genevie Ann M.D.   On: 12/29/2014 19:25   Mr Jodene Nam Head/brain Wo Cm  12/29/2014   CLINICAL DATA:  81 year old female with generalized weakness, horizontal double vision, amnesia. Some symptom resolution. Previous TIA. Initial encounter.  EXAM: MRI HEAD WITHOUT CONTRAST  MRA HEAD WITHOUT CONTRAST  TECHNIQUE: Multiplanar, multiecho pulse sequences of the brain and surrounding structures were obtained without intravenous contrast. Angiographic images of the head were obtained using MRA technique without contrast.  COMPARISON:  Head CT without contrast 1320 hr today.  FINDINGS: MRI HEAD FINDINGS  Cerebral volume is within normal limits for age. No restricted diffusion to suggest acute infarction. No midline shift, mass effect, evidence of mass lesion, ventriculomegaly, extra-axial collection or acute intracranial hemorrhage. Cervicomedullary junction and pituitary are within normal limits. Major intracranial vascular flow voids are within normal limits, dominant distal left vertebral artery.  Mild for age patchy nonspecific cerebral white matter T2 and FLAIR hyperintensity. No cortical encephalomalacia or chronic cerebral blood products identified. Deep gray matter nuclei, brainstem, and cerebellum are within normal limits for age.  Visible internal auditory structures appear normal. Mastoids are clear. Trace paranasal sinus mucosal thickening. Postoperative changes to both globes, otherwise negative orbits soft tissues. Negative scalp soft tissues. Normal bone marrow signal. Negative visualized cervical spine.  MRA HEAD FINDINGS  Antegrade flow in the posterior circulation with dominant distal left vertebral artery, the right functionally terminates in PICA. Normal left PICA origin. Normal basilar artery aside from mild tortuosity. SCA and PCA origins are within normal limits. Both posterior communicating arteries are present. Bilateral PCA branches are normal.  Antegrade flow in both ICA siphons. No siphon stenosis.  Bilateral ophthalmic and posterior communicating artery origins are normal. Normal carotid termini, MCA and ACA origins. Anterior communicating artery and visualized bilateral ACA branches are within normal limits. Bilateral MCA M1 segments are normal. Visualized bilateral MCA branches are within normal limits.  IMPRESSION: 1. No acute intracranial abnormality. Mild for age nonspecific cerebral white matter signal changes. 2.  Negative intracranial MRA.   Electronically Signed   By: Genevie Ann M.D.   On: 12/29/2014 19:25    Microbiology: No results found for this or any previous visit (from the past 240 hour(s)).   Labs: Basic Metabolic Panel:  Recent Labs Lab 12/29/14 1247 12/29/14 1311 12/30/14 1058  NA 130* 129* 136  K 4.0 3.7 3.4*  CL 92* 91* 96*  CO2 28  --  29  GLUCOSE 115* 114* 124*  BUN <5* 6 8  CREATININE 0.68 0.70 0.90  CALCIUM 10.0  --  10.9*   Liver Function Tests:  Recent Labs Lab 12/29/14 1247  AST 32  ALT 29  ALKPHOS 122  BILITOT 0.7  PROT 6.7  ALBUMIN 4.4   No results for input(s): LIPASE, AMYLASE in the last 168 hours. No results for input(s): AMMONIA in the last 168 hours. CBC:  Recent Labs Lab 12/29/14 1247 12/29/14 1311  WBC 6.9  --   NEUTROABS 3.5  --  HGB 13.8 15.6*  HCT 38.9 46.0  MCV 87.8  --   PLT 337  --    Cardiac Enzymes: No results for input(s): CKTOTAL, CKMB, CKMBINDEX, TROPONINI in the last 168 hours. BNP: BNP (last 3 results) No results for input(s): BNP in the last 8760 hours.  ProBNP (last 3 results) No results for input(s): PROBNP in the last 8760 hours.  CBG: No results for input(s): GLUCAP in the last 168 hours.     SignedEulogio Bear  [facility] 12/30/2014, 3:39 PM

## 2014-12-30 NOTE — Progress Notes (Signed)
Occupational Therapy Evaluation Patient Details Name: Diane Chambers MRN: [id number] DOB: 07-13-1933 Today's Date: 12/30/2014    History of Present Illness Diane Chambers is a 81 year old female who states she has had multiple TIA in the past, which had generalized weakness and an amnestic period. Patient admitted for work up, MRI negative for acute CVA.   Clinical Impression   PTA, pt lived alone and was independent with ADL and mobility. Pt assessed with the [facility]: Lambert Rhonda W Cognitive Assessment and received a score of 28/30, which is considered WFL. Pt openly discussed her feelings of depression and stated that she has been drinking nightly (2-3 drinks) and taking her trazadone and "sleeping aid" with these drugs. Pt became tearful when discussing her feelings of depression and her feelings of being alone. Recommend pt follow up with HHOT/PT and RN for medication management. Also recommend pt seek counseling for depression. Discussed D/C recommendations with MD. All further OT needs can be addressed by [place].     Follow Up Recommendations  Home health OT;Supervision - Intermittent    Equipment Recommendations  3 in 1 bedside comode - for shower use   Recommendations for Other Services       Precautions / Restrictions Precautions Precautions: Fall Restrictions Weight Bearing Restrictions: No      Mobility Bed Mobility     General bed mobility comments: pt up in chair  Transfers Overall transfer level: Modified independent          General transfer comment: impulsive. most likely baseline    Balance                                      ADL Overall ADL's : At baseline                                       General ADL Comments: Educated on home safety and reducing risk of falls. Recommend pt use showerchair/3 in 1 for hsower and have grab bars installed. Also recommend pt consider using "home alert" monitor in case of falls given that pt lives  alone.  Discussed medication management briefly. Recommend HH address and monitor medication                      Pertinent Vitals/Pain Pain Assessment: No/denies pain     Hand Dominance Right   Extremity/Trunk Assessment Upper Extremity Assessment Upper Extremity Assessment: Overall WFL for tasks assessed   Lower Extremity Assessment Lower Extremity Assessment: Overall WFL for tasks assessed   Cervical / Trunk Assessment Cervical / Trunk Assessment: Normal   Communication Communication Communication: HOH   Cognition Arousal/Alertness: Awake/alert Behavior During Therapy: WFL for tasks assessed/performed Overall Cognitive Status: No family/caregiver present to determine baseline cognitive functioning (assessed with [facility] and received a score of 28/30 )       Memory: Decreased short-term memory             General Comments   Pt very appreciative of discussion.    Exercises       Shoulder Instructions      Home Living Family/patient expects to be discharged to:: Private residence Living Arrangements: Alone Available Help at Discharge: Family;Available PRN/intermittently Type of Home: Apartment Home Access: Stairs to enter Entrance Stairs-Number of Steps: 5 Entrance Stairs-Rails: Can reach both Home  Layout: One level     Bathroom Shower/Tub: Tub/shower unit Shower/tub characteristics: Industrial/product designer: Yes How Accessible: Accessible via walker Home Equipment: None      Lives With: Alone    Prior Functioning/Environment Level of Independence: Independent             OT Diagnosis: Cognitive deficits   OT Problem List: Impaired balance (sitting and/or standing);Decreased safety awareness;Decreased knowledge of use of DME or AE;Decreased knowledge of precautions;Decreased cognition   OT Treatment/Interventions:      OT Goals(Current goals can be found in the care plan section) Acute Rehab OT  Goals Patient Stated Goal: home OT Goal Formulation: All assessment and education complete, DC therapy  OT Frequency:     Barriers to D/C:            Co-evaluation              End of Session Nurse Communication: Mobility status  Activity Tolerance: Patient tolerated treatment well Patient left: in chair;with call bell/phone within reach;with chair alarm set   Time: 1400-1422 OT Time Calculation (min): 22 min Charges:  OT General Charges $OT Visit: 1 Procedure OT Evaluation $Initial OT Evaluation Tier I: 1 Procedure G-Codes: OT G-codes **NOT FOR INPATIENT CLASS** Functional Assessment Tool Used: clinical judgement Functional Limitation: Self care Self Care Current Status (Z6606): At least 1 percent but less than 20 percent impaired, limited or restricted Self Care Goal Status (T0160): At least 1 percent but less than 20 percent impaired, limited or restricted Self Care Discharge Status [phone]): At least 1 percent but less than 20 percent impaired, limited or restricted  Sheza Strickland,HILLARY 12/30/2014, 4:16 PM   [facility], OTR/L  [phone] 12/30/2014

## 2014-12-30 NOTE — Care Management Note (Signed)
Case Management Note  Patient Details  Name: ALBINA GOSNEY MRN: [id number] Date of Birth: 1933/10/02  Subjective/Objective:                    Action/Plan: Met with patient to discuss discharge needs. Patient is agreeable to home health and has chosen [facility].  Miranda with [facility] was notified and has accepted the referral for discharge home today.  Expected Discharge Date:                  Expected Discharge Plan:  [place]  In-House Referral:     Discharge planning Services  CM Consult  Post Acute Care Choice:  Home Health Choice offered to:  Patient  DME Arranged:    DME Agency:     HH Arranged:  RN, PT, OT HH Agency:  [facility]  Status of Service:  Completed, signed off  Medicare Important Message Given:    Date Medicare IM Given:    Medicare IM give by:    Date Additional Medicare IM Given:    Additional Medicare Important Message give by:     If discussed at [place] of Stay Meetings, dates discussed:    Additional Comments:  Rolm Baptise, RN 12/30/2014, 4:06 PM

## 2014-12-30 NOTE — Evaluation (Signed)
Speech Language Pathology Evaluation Patient Details Name: Diane Chambers MRN: [id number] DOB: 29-Feb-1934 Today's Date: 12/30/2014 Time: 1120-1150 SLP Time Calculation (min) (ACUTE ONLY): 30 min  Problem List:  Patient Active Problem List   Diagnosis Date Noted  . Confusion 12/29/2014  . TIA (transient ischemic attack) 12/29/2014  . Essential hypertension 12/29/2014  . Hyponatremia 12/29/2014   Past Medical History:  Past Medical History  Diagnosis Date  . Hypertension   . TIA (transient ischemic attack)     times 4   Past Surgical History:  Past Surgical History  Procedure Laterality Date  . Cholecystectomy    . Fracture surgery    . Joint replacement      right knee   HPI:  Diane Chambers is a 81 year old female who states she has had multiple TIA in the past, which had generalized weakness and an amnestic period.  Patient admitted for work up, MRI negative for acute CVA.  Orders received for cognitive-linguistic evaluation.     Assessment / Plan / Recommendation Clinical Impression  Cognitive-linguistic evaluation complete. Language is intact and oral motor exam is remarkable for trace sensory changes on her right side.  Patient oriented and able to recall home medications and solve complex problems with extra time.  However, demonstrates some difficulty with recall of new information.  Education with provided on use of external aids to assist with recall of new information.  Patient reported that she has had some intermittent confusion at baseline and has been referred to a memory clinic per her primary care physician.  Based upon today's performance no further skilled SLP services are warranted at this time; however, recommend patient keep memory clinic appointment.  SLP signing off at this time.      SLP Assessment  Patient does not need any further Speech Lanaguage Pathology Services    Follow Up Recommendations  Other (comment) (keep appointment with memory clinic)             Pertinent Vitals/Pain Pain Assessment: No/denies pain   SLP Goals  Progression toward goals:  (Eval ) Patient/Family Stated Goal: to determine what is wrong  SLP Evaluation Prior Functioning  Cognitive/Linguistic Baseline: Baseline deficits Baseline deficit details: memory deficits and has been referred to [facility] per patient  Lives With: Alone Available Help at Discharge: Family;Available PRN/intermittently Vocation: Retired   Associate Professor  Overall Cognitive Status: No family/caregiver present to determine baseline cognitive functioning (patient functional for all tasks, but reports baseline impairments) Arousal/Alertness: Awake/alert Orientation Level: Oriented X4 Attention: Selective Selective Attention: Appears intact Memory: Impaired Memory Impairment: Decreased recall of new information Awareness: Appears intact Problem Solving: Appears intact Safety/Judgment: Appears intact    Comprehension  Auditory Comprehension Overall Auditory Comprehension: Appears within functional limits for tasks assessed Visual Recognition/Discrimination Discrimination: Not tested Reading Comprehension Reading Status: Not tested    Expression Expression Primary Mode of Expression: Verbal Verbal Expression Overall Verbal Expression: Appears within functional limits for tasks assessed Written Expression Written Expression: Not tested   Oral / Motor Oral Motor/Sensory Function Overall Oral Motor/Sensory Function: Impaired Labial Sensation: Reduced Facial Sensation: Reduced Motor Speech Overall Motor Speech: Appears within functional limits for tasks assessed   GO Functional Assessment Tool Used: skilled clinical judgement Functional Limitations: Memory Memory Current Status (D6222): At least 1 percent but less than 20 percent impaired, limited or restricted Memory Goal Status (L7989): At least 1 percent but less than 20 percent impaired, limited or restricted Memory  Discharge Status [phone]): At least  1 percent but less than 20 percent impaired, limited or restricted   Carmelia Roller., CCC-SLP [phone]  [place] 12/30/2014, 12:32 PM

## 2014-12-30 NOTE — Progress Notes (Signed)
SLP Cancellation Note  Patient Details Name: Diane Chambers MRN: [id number] DOB: 1934-01-16   Cancelled treatment:       Reason Eval/Treat Not Completed: Patient at procedure or test/unavailable.    Gunnar Fusi, M.A., CCC-SLP [phone]  [place] 12/30/2014, 10:08 AM

## 2014-12-30 NOTE — Progress Notes (Signed)
Discharge orders received. Pt educated on discharge instructions and stroke education. Pt verbalized understanding. Pt given discharge packet and prescription. IV and tele removed. Pt dressed and belongings packed. Pt taken to ED exit by staff via wheelchair per request.

## 2014-12-30 NOTE — Procedures (Addendum)
EEG report.  Brief clinical history:  81 y.o. female presenting with transient diplopia and generalized weakness. It is unclear if this may be a TIA versus medication/ETOH SE. There is also concern of a component of cognitive decline.   Technique: this is a 17 channel routine scalp EEG performed at the bedside with bipolar and monopolar montages arranged in accordance to the international 10/20 system of electrode placement. One channel was dedicated to EKG recording.  The study was performed during wakefulness and drowsiness. No activating procedures performed.  Description:In the wakeful state, the best background consisted of a low amplitude, posterior dominant, well sustained, symmetric and reactive 12 Hz rhythm. Drowsiness demonstrated dropout of the alpha rhythm. No focal or generalized epileptiform discharges noted.  Infrequent intermittent left temporal theta slowing noted. EKG showed sinus rhythm.  Impression: this is a normal awake and drowsy EEG. The presence of infrequent intermittent left temporal theta slowing is considered a normal phenomenon in this age population.  . Please, be aware that a normal EEG does not exclude the possibility of epilepsy.  Clinical correlation is advised.   Dorian Pod, MD [facility]

## 2014-12-30 NOTE — Progress Notes (Signed)
PT Cancellation Note  Patient Details Name: Diane Chambers MRN: [id number] DOB: Jun 23, 1933   Cancelled Treatment:    Reason Eval/Treat Not Completed: Patient at procedure or test/unavailable. Pt off floor at EEG and then plans to go to ECHO. PT to return as able.   Kingsley Callander 12/30/2014, 8:53 AM  Kittie Plater, PT, DPT Pager #: [phone] Office #: [phone]

## 2014-12-30 NOTE — Progress Notes (Signed)
Echocardiogram 2D Echocardiogram has been performed.  Diane Chambers 12/30/2014, 11:45 AM

## 2014-12-30 NOTE — Progress Notes (Signed)
   12/30/14 1300  Clinical Encounter Type  Visited With Patient  Visit Type Initial;Spiritual support  Referral From Nurse  Consult/Referral To Chaplain  Spiritual Encounters  Spiritual Needs Prayer;Grief support  [facility] met with pt; prayer and comfort offered

## 2014-12-30 NOTE — Progress Notes (Signed)
Subjective: No complaints.  No further episodes of confusion and weakness.   Objective: Current vital signs: BP 170/78 mmHg  Pulse 72  Temp(Src) 98.4 F (36.9 C) (Oral)  Resp 16  Ht 5\' 6"  (1.676 m)  Wt 62.596 kg (138 lb)  BMI 22.28 kg/m2  SpO2 99% Vital signs in last 24 hours: Temp:  [97.5 F (36.4 C)-98.4 F (36.9 C)] 98.4 F (36.9 C) (08/09 0753) Pulse Rate:  [72-88] 72 (08/09 0753) Resp:  [11-20] 16 (08/09 0753) BP: (147-211)/(57-93) 170/78 mmHg (08/09 0753) SpO2:  [96 %-100 %] 99 % (08/09 0753) Weight:  [62.596 kg (138 lb)] 62.596 kg (138 lb) (08/08 2003)  Intake/Output from previous day: 08/08 0701 - 08/09 0700 In: 600 [P.O.:600] Out: -  Intake/Output this shift:   Nutritional status: Diet Heart Room service appropriate?: Yes; Fluid consistency:: Thin; Fluid restriction:: 1500 mL Fluid  Neurologic Exam: General: Mental Status: Alert, oriented, thought content appropriate.  Speech fluent without evidence of aphasia.  Able to follow 3 step commands without difficulty. Cranial Nerves: II: Discs flat bilaterally; Visual fields grossly normal, pupils equal, round, reactive to light and accommodation III,IV, VI: ptosis not present, extra-ocular motions intact bilaterally V,VII: smile symmetric, facial light touch sensation normal bilaterally VIII: hearing normal bilaterally IX,X: uvula rises symmetrically XI: bilateral shoulder shrug XII: midline tongue extension without atrophy or fasciculations  Motor: Right : Upper extremity   5/5    Left:     Upper extremity   5/5  Lower extremity   5/5     Lower extremity   5/5 Tone and bulk:normal tone throughout; no atrophy noted Sensory: Pinprick and light touch intact throughout, bilaterally Deep Tendon Reflexes:  Right: Upper Extremity   Left: Upper extremity   biceps (C-5 to C-6) 2/4   biceps (C-5 to C-6) 2/4 tricep (C7) 2/4    triceps (C7) 2/4 Brachioradialis (C6) 2/4  Brachioradialis (C6) 2/4  Lower Extremity  Lower Extremity  quadriceps (L-2 to L-4) 2/4   quadriceps (L-2 to L-4) 2/4 Achilles (S1) 2/4   Achilles (S1) 2/4  Plantars: Right: downgoing   Left: downgoing     Lab Results: Basic Metabolic Panel:  Recent Labs Lab 12/29/14 1247 12/29/14 1311  NA 130* 129*  K 4.0 3.7  CL 92* 91*  CO2 28  --   GLUCOSE 115* 114*  BUN <5* 6  CREATININE 0.68 0.70  CALCIUM 10.0  --     Liver Function Tests:  Recent Labs Lab 12/29/14 1247  AST 32  ALT 29  ALKPHOS 122  BILITOT 0.7  PROT 6.7  ALBUMIN 4.4   No results for input(s): LIPASE, AMYLASE in the last 168 hours. No results for input(s): AMMONIA in the last 168 hours.  CBC:  Recent Labs Lab 12/29/14 1247 12/29/14 1311  WBC 6.9  --   NEUTROABS 3.5  --   HGB 13.8 15.6*  HCT 38.9 46.0  MCV 87.8  --   PLT 337  --     Cardiac Enzymes: No results for input(s): CKTOTAL, CKMB, CKMBINDEX, TROPONINI in the last 168 hours.  Lipid Panel: No results for input(s): CHOL, TRIG, HDL, CHOLHDL, VLDL, LDLCALC in the last 168 hours.  CBG: No results for input(s): GLUCAP in the last 168 hours.  Microbiology: No results found for this or any previous visit.  Coagulation Studies:  Recent Labs  12/29/14 1247  LABPROT 12.8  INR 0.94    Imaging: Ct Head Wo Contrast  12/29/2014   CLINICAL DATA:  81 year old female with dizziness, disorientation and double vision beginning yesterday afternoon  EXAM: CT HEAD WITHOUT CONTRAST  TECHNIQUE: Contiguous axial images were obtained from the base of the skull through the vertex without intravenous contrast.  COMPARISON:  Prior carotid ultrasound evaluation 02/14/2014  FINDINGS: Negative for acute intracranial hemorrhage, acute infarction, mass, mass effect, hydrocephalus or midline shift. Gray-white differentiation is preserved throughout. Mild cerebral cortical atrophy. Scattered periventricular, subcortical and deep white matter hypoattenuation which is nonspecific but most consistent with the  sequelae of chronic microvascular ischemic white matter disease. No focal soft tissue or calvarial abnormality. Globes and orbits are symmetric and intact bilaterally. Status post bilateral lens extractions. Normal aeration of the mastoid air cells and paranasal sinuses. Atherosclerotic calcifications present in both cavernous carotid arteries.  IMPRESSION: 1. No acute intracranial abnormality. 2. Mild atrophy and chronic microvascular ischemic white matter disease.   Electronically Signed   By: Jacqulynn Cadet M.D.   On: 12/29/2014 14:02   Mr Brain Wo Contrast  12/29/2014   CLINICAL DATA:  81 year old female with generalized weakness, horizontal double vision, amnesia. Some symptom resolution. Previous TIA. Initial encounter.  EXAM: MRI HEAD WITHOUT CONTRAST  MRA HEAD WITHOUT CONTRAST  TECHNIQUE: Multiplanar, multiecho pulse sequences of the brain and surrounding structures were obtained without intravenous contrast. Angiographic images of the head were obtained using MRA technique without contrast.  COMPARISON:  Head CT without contrast 1320 hr today.  FINDINGS: MRI HEAD FINDINGS  Cerebral volume is within normal limits for age. No restricted diffusion to suggest acute infarction. No midline shift, mass effect, evidence of mass lesion, ventriculomegaly, extra-axial collection or acute intracranial hemorrhage. Cervicomedullary junction and pituitary are within normal limits. Major intracranial vascular flow voids are within normal limits, dominant distal left vertebral artery.  Mild for age patchy nonspecific cerebral white matter T2 and FLAIR hyperintensity. No cortical encephalomalacia or chronic cerebral blood products identified. Deep gray matter nuclei, brainstem, and cerebellum are within normal limits for age.  Visible internal auditory structures appear normal. Mastoids are clear. Trace paranasal sinus mucosal thickening. Postoperative changes to both globes, otherwise negative orbits soft tissues.  Negative scalp soft tissues. Normal bone marrow signal. Negative visualized cervical spine.  MRA HEAD FINDINGS  Antegrade flow in the posterior circulation with dominant distal left vertebral artery, the right functionally terminates in PICA. Normal left PICA origin. Normal basilar artery aside from mild tortuosity. SCA and PCA origins are within normal limits. Both posterior communicating arteries are present. Bilateral PCA branches are normal.  Antegrade flow in both ICA siphons. No siphon stenosis. Bilateral ophthalmic and posterior communicating artery origins are normal. Normal carotid termini, MCA and ACA origins. Anterior communicating artery and visualized bilateral ACA branches are within normal limits. Bilateral MCA M1 segments are normal. Visualized bilateral MCA branches are within normal limits.  IMPRESSION: 1. No acute intracranial abnormality. Mild for age nonspecific cerebral white matter signal changes. 2.  Negative intracranial MRA.   Electronically Signed   By: Genevie Ann M.D.   On: 12/29/2014 19:25   Mr Jodene Nam Head/brain Wo Cm  12/29/2014   CLINICAL DATA:  81 year old female with generalized weakness, horizontal double vision, amnesia. Some symptom resolution. Previous TIA. Initial encounter.  EXAM: MRI HEAD WITHOUT CONTRAST  MRA HEAD WITHOUT CONTRAST  TECHNIQUE: Multiplanar, multiecho pulse sequences of the brain and surrounding structures were obtained without intravenous contrast. Angiographic images of the head were obtained using MRA technique without contrast.  COMPARISON:  Head CT without contrast 1320 hr today.  FINDINGS:  MRI HEAD FINDINGS  Cerebral volume is within normal limits for age. No restricted diffusion to suggest acute infarction. No midline shift, mass effect, evidence of mass lesion, ventriculomegaly, extra-axial collection or acute intracranial hemorrhage. Cervicomedullary junction and pituitary are within normal limits. Major intracranial vascular flow voids are within normal  limits, dominant distal left vertebral artery.  Mild for age patchy nonspecific cerebral white matter T2 and FLAIR hyperintensity. No cortical encephalomalacia or chronic cerebral blood products identified. Deep gray matter nuclei, brainstem, and cerebellum are within normal limits for age.  Visible internal auditory structures appear normal. Mastoids are clear. Trace paranasal sinus mucosal thickening. Postoperative changes to both globes, otherwise negative orbits soft tissues. Negative scalp soft tissues. Normal bone marrow signal. Negative visualized cervical spine.  MRA HEAD FINDINGS  Antegrade flow in the posterior circulation with dominant distal left vertebral artery, the right functionally terminates in PICA. Normal left PICA origin. Normal basilar artery aside from mild tortuosity. SCA and PCA origins are within normal limits. Both posterior communicating arteries are present. Bilateral PCA branches are normal.  Antegrade flow in both ICA siphons. No siphon stenosis. Bilateral ophthalmic and posterior communicating artery origins are normal. Normal carotid termini, MCA and ACA origins. Anterior communicating artery and visualized bilateral ACA branches are within normal limits. Bilateral MCA M1 segments are normal. Visualized bilateral MCA branches are within normal limits.  IMPRESSION: 1. No acute intracranial abnormality. Mild for age nonspecific cerebral white matter signal changes. 2.  Negative intracranial MRA.   Electronically Signed   By: Genevie Ann M.D.   On: 12/29/2014 19:25    Medications:  Scheduled: . aspirin  325 mg Oral Daily  . atorvastatin  20 mg Oral Daily  . B-complex with vitamin C  1 tablet Oral Daily  . calcium-vitamin D  1 tablet Oral Q breakfast  . cholecalciferol  1,000 Units Oral Daily  . enoxaparin (LOVENOX) injection  40 mg Subcutaneous Q24H  . folic acid  1 mg Oral Daily  . levothyroxine  112 mcg Oral QAC breakfast  . losartan  25 mg Oral Daily  . magnesium oxide  200  mg Oral Daily  . multivitamin with minerals  1 tablet Oral Daily  . thiamine  100 mg Oral Daily    Assessment/Plan: 81 YO female with transient diplopia and generalized weakness. All symptoms have resolved. MRI and MRA brain were within normal limits. EEG showed no epileptiform activity. TSH, B12, RPR, Folate all within normal limits. Although etiology of weakness and diplopia is not clear there is very good likelihood it could be secondary to combination of sleep aid and ETOH taken together.   Recommend: 1) out patient neurology follow up as family is concerned about cognitive decline.    No further in patient neurology work up. Neurology S/O    Etta Quill PA-C [facility] [phone]  12/30/2014, 10:47 AM  I personally participated in this patient's evaluation and management, including formulating the above clinical impression and management recommendations.  Rush Farmer M.D. [facility] [phone]

## 2014-12-30 NOTE — Evaluation (Signed)
Physical Therapy Evaluation Patient Details Name: Diane Chambers MRN: [id number] DOB: 1933/06/27 Today's Date: 12/30/2014   History of Present Illness  Diane Chambers is a 81 year old female who states she has had multiple TIA in the past, which had generalized weakness and an amnestic period. Patient admitted for work up, MRI negative for acute CVA.  Clinical Impression  Pt functioning near baseline. Pt with mild balance impairment as demod by score of 16 on DGI. Pt with generalized weakness and noted memory deficits as well. Recommend HHPT to progress pt to safe mod I level of function.     Follow Up Recommendations Home health PT;Supervision/Assistance - 24 hour    Equipment Recommendations  None recommended by PT    Recommendations for Other Services       Precautions / Restrictions Precautions Precautions: Fall Restrictions Weight Bearing Restrictions: No      Mobility  Bed Mobility Overal bed mobility: Needs Assistance Bed Mobility: Supine to Sit     Supine to sit: Supervision     General bed mobility comments: supervision for safety due to impulsivity, decreased insight to deficits  Transfers Overall transfer level: Needs assistance Equipment used: None Transfers: Sit to/from Stand Sit to Stand: Min guard         General transfer comment: min guard for safety and mild instability  Ambulation/Gait Ambulation/Gait assistance: Min guard Ambulation Distance (Feet): 300 Feet Assistive device: None Gait Pattern/deviations: Step-through pattern;Staggering left;Staggering right;Narrow base of support Gait velocity: decreased Gait velocity interpretation: Below normal speed for age/gender General Gait Details: pt mildly unsteady but no overt LOB. pt with occasional cross over gait pattern due to narrow BOS  Stairs Stairs: Yes Stairs assistance: Min guard Stair Management: One rail Right;Alternating pattern Number of Stairs: 5 General stair comments: v/c's to  slow down  Wheelchair Mobility    Modified Rankin (Stroke Patients Only)       Balance                                 Standardized Balance Assessment Standardized Balance Assessment : Dynamic Gait Index   Dynamic Gait Index Level Surface: Mild Impairment Change in Gait Speed: Mild Impairment Gait with Horizontal Head Turns: Mild Impairment Gait with Vertical Head Turns: Mild Impairment Gait and Pivot Turn: Mild Impairment Step Over Obstacle: Mild Impairment Step Around Obstacles: Mild Impairment Steps: Mild Impairment Total Score: 16       Pertinent Vitals/Pain Pain Assessment: No/denies pain    Home Living Family/patient expects to be discharged to:: Private residence Living Arrangements: Alone Available Help at Discharge: Family;Available PRN/intermittently Type of Home: Apartment Home Access: Stairs to enter Entrance Stairs-Rails: Can reach both Entrance Stairs-Number of Steps: 5 Home Layout: One level        Prior Function Level of Independence: Independent               Hand Dominance   Dominant Hand: Right    Extremity/Trunk Assessment   Upper Extremity Assessment: Defer to OT evaluation           Lower Extremity Assessment: Overall WFL for tasks assessed      Cervical / Trunk Assessment: Normal  Communication   Communication: HOH  Cognition Arousal/Alertness: Awake/alert Behavior During Therapy: WFL for tasks assessed/performed Overall Cognitive Status: No family/caregiver present to determine baseline cognitive functioning       Memory: Decreased short-term memory  General Comments General comments (skin integrity, edema, etc.): pt assisted into bathrom x 2, supervision for hygiene    Exercises        Assessment/Plan    PT Assessment Patient needs continued PT services  PT Diagnosis Difficulty walking;Generalized weakness   PT Problem List Decreased strength;Decreased activity  tolerance;Decreased balance;Decreased mobility;Decreased coordination  PT Treatment Interventions DME instruction;Gait training;Stair training;Functional mobility training;Therapeutic activities;Therapeutic exercise;Balance training   PT Goals (Current goals can be found in the Care Plan section) Acute Rehab PT Goals Patient Stated Goal: home PT Goal Formulation: With patient Time For Goal Achievement: 01/06/15 Potential to Achieve Goals: Good    Frequency Min 3X/week   Barriers to discharge Decreased caregiver support lives alone    Co-evaluation               End of Session Equipment Utilized During Treatment: Gait belt Activity Tolerance: Patient tolerated treatment well Patient left: in chair;with call bell/phone within reach;with chair alarm set Nurse Communication: Mobility status    Functional Assessment Tool Used: clincial judgment Functional Limitation: Mobility: Walking and moving around Mobility: Walking and Moving Around Current Status [phone]): At least 1 percent but less than 20 percent impaired, limited or restricted Mobility: Walking and Moving Around Goal Status [phone]): At least 1 percent but less than 20 percent impaired, limited or restricted    Time: 1320-1350 PT Time Calculation (min) (ACUTE ONLY): 30 min   Charges:   PT Evaluation $Initial PT Evaluation Tier I: 1 Procedure PT Treatments $Gait Training: 8-22 mins   PT G Codes:   PT G-Codes **NOT FOR INPATIENT CLASS** Functional Assessment Tool Used: clincial judgment Functional Limitation: Mobility: Walking and moving around Mobility: Walking and Moving Around Current Status (G8676): At least 1 percent but less than 20 percent impaired, limited or restricted Mobility: Walking and Moving Around Goal Status [phone]): At least 1 percent but less than 20 percent impaired, limited or restricted    Kingsley Callander 12/30/2014, 3:42 PM   Kittie Plater, PT, DPT Pager #: [phone] Office #:  [phone]

## 2014-12-31 DIAGNOSIS — H3531 Nonexudative age-related macular degeneration: Secondary | ICD-10-CM | POA: Diagnosis not present

## 2014-12-31 LAB — HEMOGLOBIN A1C
Hgb A1c MFr Bld: 5.7 % — ABNORMAL HIGH (ref 4.8–5.6)
MEAN PLASMA GLUCOSE: 117 mg/dL

## 2015-01-05 DIAGNOSIS — R739 Hyperglycemia, unspecified: Secondary | ICD-10-CM | POA: Diagnosis not present

## 2015-01-05 DIAGNOSIS — E871 Hypo-osmolality and hyponatremia: Secondary | ICD-10-CM | POA: Diagnosis not present

## 2015-01-05 DIAGNOSIS — I10 Essential (primary) hypertension: Secondary | ICD-10-CM | POA: Diagnosis not present

## 2015-01-05 DIAGNOSIS — G459 Transient cerebral ischemic attack, unspecified: Secondary | ICD-10-CM | POA: Diagnosis not present

## 2015-01-05 DIAGNOSIS — Z23 Encounter for immunization: Secondary | ICD-10-CM | POA: Diagnosis not present

## 2015-01-05 DIAGNOSIS — G4700 Insomnia, unspecified: Secondary | ICD-10-CM | POA: Diagnosis not present

## 2015-01-05 DIAGNOSIS — I6529 Occlusion and stenosis of unspecified carotid artery: Secondary | ICD-10-CM | POA: Diagnosis not present

## 2015-01-05 DIAGNOSIS — F320 Major depressive disorder, single episode, mild: Secondary | ICD-10-CM | POA: Diagnosis not present

## 2015-01-14 ENCOUNTER — Other Ambulatory Visit: Payer: Self-pay | Admitting: Family Medicine

## 2015-01-14 DIAGNOSIS — I6529 Occlusion and stenosis of unspecified carotid artery: Secondary | ICD-10-CM

## 2015-01-15 DIAGNOSIS — H524 Presbyopia: Secondary | ICD-10-CM | POA: Diagnosis not present

## 2015-01-15 DIAGNOSIS — H3531 Nonexudative age-related macular degeneration: Secondary | ICD-10-CM | POA: Diagnosis not present

## 2015-01-15 DIAGNOSIS — H26493 Other secondary cataract, bilateral: Secondary | ICD-10-CM | POA: Diagnosis not present

## 2015-01-19 ENCOUNTER — Other Ambulatory Visit: Payer: Medicare Other

## 2015-01-30 DIAGNOSIS — H3531 Nonexudative age-related macular degeneration: Secondary | ICD-10-CM | POA: Diagnosis not present

## 2015-02-17 DIAGNOSIS — I10 Essential (primary) hypertension: Secondary | ICD-10-CM | POA: Diagnosis not present

## 2015-02-17 DIAGNOSIS — F329 Major depressive disorder, single episode, unspecified: Secondary | ICD-10-CM | POA: Diagnosis not present

## 2015-02-17 DIAGNOSIS — G4700 Insomnia, unspecified: Secondary | ICD-10-CM | POA: Diagnosis not present

## 2015-02-18 ENCOUNTER — Ambulatory Visit
Admission: RE | Admit: 2015-02-18 | Discharge: 2015-02-18 | Disposition: A | Discharge status: Home / Self Care | Payer: Medicare Other | Source: Ambulatory Visit | Attending: Family Medicine | Admitting: Family Medicine

## 2015-02-18 DIAGNOSIS — I6523 Occlusion and stenosis of bilateral carotid arteries: Secondary | ICD-10-CM | POA: Diagnosis not present

## 2015-02-18 DIAGNOSIS — I6529 Occlusion and stenosis of unspecified carotid artery: Secondary | ICD-10-CM

## 2015-03-01 DIAGNOSIS — H353132 Nonexudative age-related macular degeneration, bilateral, intermediate dry stage: Secondary | ICD-10-CM | POA: Diagnosis not present

## 2015-03-16 DIAGNOSIS — H26493 Other secondary cataract, bilateral: Secondary | ICD-10-CM | POA: Diagnosis not present

## 2015-03-30 DIAGNOSIS — E871 Hypo-osmolality and hyponatremia: Secondary | ICD-10-CM | POA: Diagnosis not present

## 2015-03-30 DIAGNOSIS — I6529 Occlusion and stenosis of unspecified carotid artery: Secondary | ICD-10-CM | POA: Diagnosis not present

## 2015-03-30 DIAGNOSIS — E785 Hyperlipidemia, unspecified: Secondary | ICD-10-CM | POA: Diagnosis not present

## 2015-03-30 DIAGNOSIS — Z79899 Other long term (current) drug therapy: Secondary | ICD-10-CM | POA: Diagnosis not present

## 2015-03-31 DIAGNOSIS — H353132 Nonexudative age-related macular degeneration, bilateral, intermediate dry stage: Secondary | ICD-10-CM | POA: Diagnosis not present

## 2015-04-30 DIAGNOSIS — H353132 Nonexudative age-related macular degeneration, bilateral, intermediate dry stage: Secondary | ICD-10-CM | POA: Diagnosis not present

## 2015-05-28 DIAGNOSIS — H353132 Nonexudative age-related macular degeneration, bilateral, intermediate dry stage: Secondary | ICD-10-CM | POA: Diagnosis not present

## 2015-05-28 DIAGNOSIS — H524 Presbyopia: Secondary | ICD-10-CM | POA: Diagnosis not present

## 2015-05-28 DIAGNOSIS — H26492 Other secondary cataract, left eye: Secondary | ICD-10-CM | POA: Diagnosis not present

## 2015-06-08 DIAGNOSIS — R1030 Lower abdominal pain, unspecified: Secondary | ICD-10-CM | POA: Diagnosis not present

## 2015-06-08 DIAGNOSIS — R739 Hyperglycemia, unspecified: Secondary | ICD-10-CM | POA: Diagnosis not present

## 2015-06-08 DIAGNOSIS — M25552 Pain in left hip: Secondary | ICD-10-CM | POA: Diagnosis not present

## 2015-06-08 DIAGNOSIS — R350 Frequency of micturition: Secondary | ICD-10-CM | POA: Diagnosis not present

## 2015-06-08 DIAGNOSIS — G4700 Insomnia, unspecified: Secondary | ICD-10-CM | POA: Diagnosis not present

## 2015-06-11 DIAGNOSIS — L853 Xerosis cutis: Secondary | ICD-10-CM | POA: Diagnosis not present

## 2015-07-01 DIAGNOSIS — E039 Hypothyroidism, unspecified: Secondary | ICD-10-CM | POA: Diagnosis not present

## 2015-07-01 DIAGNOSIS — Z79899 Other long term (current) drug therapy: Secondary | ICD-10-CM | POA: Diagnosis not present

## 2015-07-01 DIAGNOSIS — R739 Hyperglycemia, unspecified: Secondary | ICD-10-CM | POA: Diagnosis not present

## 2015-07-01 DIAGNOSIS — E785 Hyperlipidemia, unspecified: Secondary | ICD-10-CM | POA: Diagnosis not present

## 2015-07-01 DIAGNOSIS — R1030 Lower abdominal pain, unspecified: Secondary | ICD-10-CM | POA: Diagnosis not present

## 2015-07-07 ENCOUNTER — Other Ambulatory Visit: Payer: Self-pay

## 2015-07-07 DIAGNOSIS — Z1231 Encounter for screening mammogram for malignant neoplasm of breast: Secondary | ICD-10-CM

## 2015-07-13 DIAGNOSIS — M9901 Segmental and somatic dysfunction of cervical region: Secondary | ICD-10-CM | POA: Diagnosis not present

## 2015-07-13 DIAGNOSIS — M9902 Segmental and somatic dysfunction of thoracic region: Secondary | ICD-10-CM | POA: Diagnosis not present

## 2015-07-13 DIAGNOSIS — M546 Pain in thoracic spine: Secondary | ICD-10-CM | POA: Diagnosis not present

## 2015-07-13 DIAGNOSIS — M542 Cervicalgia: Secondary | ICD-10-CM | POA: Diagnosis not present

## 2015-07-15 DIAGNOSIS — M9902 Segmental and somatic dysfunction of thoracic region: Secondary | ICD-10-CM | POA: Diagnosis not present

## 2015-07-15 DIAGNOSIS — H43813 Vitreous degeneration, bilateral: Secondary | ICD-10-CM | POA: Diagnosis not present

## 2015-07-15 DIAGNOSIS — M546 Pain in thoracic spine: Secondary | ICD-10-CM | POA: Diagnosis not present

## 2015-07-15 DIAGNOSIS — M9901 Segmental and somatic dysfunction of cervical region: Secondary | ICD-10-CM | POA: Diagnosis not present

## 2015-07-15 DIAGNOSIS — H353113 Nonexudative age-related macular degeneration, right eye, advanced atrophic without subfoveal involvement: Secondary | ICD-10-CM | POA: Diagnosis not present

## 2015-07-15 DIAGNOSIS — H353124 Nonexudative age-related macular degeneration, left eye, advanced atrophic with subfoveal involvement: Secondary | ICD-10-CM | POA: Diagnosis not present

## 2015-07-15 DIAGNOSIS — M542 Cervicalgia: Secondary | ICD-10-CM | POA: Diagnosis not present

## 2015-07-17 DIAGNOSIS — M542 Cervicalgia: Secondary | ICD-10-CM | POA: Diagnosis not present

## 2015-07-17 DIAGNOSIS — M546 Pain in thoracic spine: Secondary | ICD-10-CM | POA: Diagnosis not present

## 2015-07-17 DIAGNOSIS — M9901 Segmental and somatic dysfunction of cervical region: Secondary | ICD-10-CM | POA: Diagnosis not present

## 2015-07-17 DIAGNOSIS — M9902 Segmental and somatic dysfunction of thoracic region: Secondary | ICD-10-CM | POA: Diagnosis not present

## 2015-07-20 DIAGNOSIS — M9902 Segmental and somatic dysfunction of thoracic region: Secondary | ICD-10-CM | POA: Diagnosis not present

## 2015-07-20 DIAGNOSIS — M9901 Segmental and somatic dysfunction of cervical region: Secondary | ICD-10-CM | POA: Diagnosis not present

## 2015-07-20 DIAGNOSIS — M542 Cervicalgia: Secondary | ICD-10-CM | POA: Diagnosis not present

## 2015-07-20 DIAGNOSIS — M546 Pain in thoracic spine: Secondary | ICD-10-CM | POA: Diagnosis not present

## 2015-07-22 DIAGNOSIS — M546 Pain in thoracic spine: Secondary | ICD-10-CM | POA: Diagnosis not present

## 2015-07-22 DIAGNOSIS — M542 Cervicalgia: Secondary | ICD-10-CM | POA: Diagnosis not present

## 2015-07-22 DIAGNOSIS — M9902 Segmental and somatic dysfunction of thoracic region: Secondary | ICD-10-CM | POA: Diagnosis not present

## 2015-07-22 DIAGNOSIS — M9901 Segmental and somatic dysfunction of cervical region: Secondary | ICD-10-CM | POA: Diagnosis not present

## 2015-07-24 DIAGNOSIS — M546 Pain in thoracic spine: Secondary | ICD-10-CM | POA: Diagnosis not present

## 2015-07-24 DIAGNOSIS — M542 Cervicalgia: Secondary | ICD-10-CM | POA: Diagnosis not present

## 2015-07-24 DIAGNOSIS — M9901 Segmental and somatic dysfunction of cervical region: Secondary | ICD-10-CM | POA: Diagnosis not present

## 2015-07-24 DIAGNOSIS — M9902 Segmental and somatic dysfunction of thoracic region: Secondary | ICD-10-CM | POA: Diagnosis not present

## 2015-07-28 DIAGNOSIS — M546 Pain in thoracic spine: Secondary | ICD-10-CM | POA: Diagnosis not present

## 2015-07-28 DIAGNOSIS — M9902 Segmental and somatic dysfunction of thoracic region: Secondary | ICD-10-CM | POA: Diagnosis not present

## 2015-07-28 DIAGNOSIS — M542 Cervicalgia: Secondary | ICD-10-CM | POA: Diagnosis not present

## 2015-07-28 DIAGNOSIS — M9901 Segmental and somatic dysfunction of cervical region: Secondary | ICD-10-CM | POA: Diagnosis not present

## 2015-07-29 DIAGNOSIS — M546 Pain in thoracic spine: Secondary | ICD-10-CM | POA: Diagnosis not present

## 2015-07-29 DIAGNOSIS — M9902 Segmental and somatic dysfunction of thoracic region: Secondary | ICD-10-CM | POA: Diagnosis not present

## 2015-07-29 DIAGNOSIS — M542 Cervicalgia: Secondary | ICD-10-CM | POA: Diagnosis not present

## 2015-07-29 DIAGNOSIS — M9901 Segmental and somatic dysfunction of cervical region: Secondary | ICD-10-CM | POA: Diagnosis not present

## 2015-07-31 DIAGNOSIS — M542 Cervicalgia: Secondary | ICD-10-CM | POA: Diagnosis not present

## 2015-07-31 DIAGNOSIS — M9902 Segmental and somatic dysfunction of thoracic region: Secondary | ICD-10-CM | POA: Diagnosis not present

## 2015-07-31 DIAGNOSIS — M9901 Segmental and somatic dysfunction of cervical region: Secondary | ICD-10-CM | POA: Diagnosis not present

## 2015-07-31 DIAGNOSIS — M546 Pain in thoracic spine: Secondary | ICD-10-CM | POA: Diagnosis not present

## 2015-08-02 DIAGNOSIS — H353132 Nonexudative age-related macular degeneration, bilateral, intermediate dry stage: Secondary | ICD-10-CM | POA: Diagnosis not present

## 2015-08-03 DIAGNOSIS — M542 Cervicalgia: Secondary | ICD-10-CM | POA: Diagnosis not present

## 2015-08-03 DIAGNOSIS — L820 Inflamed seborrheic keratosis: Secondary | ICD-10-CM | POA: Diagnosis not present

## 2015-08-03 DIAGNOSIS — M546 Pain in thoracic spine: Secondary | ICD-10-CM | POA: Diagnosis not present

## 2015-08-03 DIAGNOSIS — M9902 Segmental and somatic dysfunction of thoracic region: Secondary | ICD-10-CM | POA: Diagnosis not present

## 2015-08-03 DIAGNOSIS — M9901 Segmental and somatic dysfunction of cervical region: Secondary | ICD-10-CM | POA: Diagnosis not present

## 2015-08-07 DIAGNOSIS — M542 Cervicalgia: Secondary | ICD-10-CM | POA: Diagnosis not present

## 2015-08-07 DIAGNOSIS — M9901 Segmental and somatic dysfunction of cervical region: Secondary | ICD-10-CM | POA: Diagnosis not present

## 2015-08-07 DIAGNOSIS — M9902 Segmental and somatic dysfunction of thoracic region: Secondary | ICD-10-CM | POA: Diagnosis not present

## 2015-08-07 DIAGNOSIS — M546 Pain in thoracic spine: Secondary | ICD-10-CM | POA: Diagnosis not present

## 2015-08-10 DIAGNOSIS — M9902 Segmental and somatic dysfunction of thoracic region: Secondary | ICD-10-CM | POA: Diagnosis not present

## 2015-08-10 DIAGNOSIS — M542 Cervicalgia: Secondary | ICD-10-CM | POA: Diagnosis not present

## 2015-08-10 DIAGNOSIS — M546 Pain in thoracic spine: Secondary | ICD-10-CM | POA: Diagnosis not present

## 2015-08-10 DIAGNOSIS — M9901 Segmental and somatic dysfunction of cervical region: Secondary | ICD-10-CM | POA: Diagnosis not present

## 2015-08-12 DIAGNOSIS — M9901 Segmental and somatic dysfunction of cervical region: Secondary | ICD-10-CM | POA: Diagnosis not present

## 2015-08-12 DIAGNOSIS — M9902 Segmental and somatic dysfunction of thoracic region: Secondary | ICD-10-CM | POA: Diagnosis not present

## 2015-08-12 DIAGNOSIS — M546 Pain in thoracic spine: Secondary | ICD-10-CM | POA: Diagnosis not present

## 2015-08-12 DIAGNOSIS — M542 Cervicalgia: Secondary | ICD-10-CM | POA: Diagnosis not present

## 2015-08-17 ENCOUNTER — Ambulatory Visit: Payer: Medicare Other

## 2015-08-17 DIAGNOSIS — M546 Pain in thoracic spine: Secondary | ICD-10-CM | POA: Diagnosis not present

## 2015-08-17 DIAGNOSIS — M542 Cervicalgia: Secondary | ICD-10-CM | POA: Diagnosis not present

## 2015-08-17 DIAGNOSIS — M9902 Segmental and somatic dysfunction of thoracic region: Secondary | ICD-10-CM | POA: Diagnosis not present

## 2015-08-17 DIAGNOSIS — M9901 Segmental and somatic dysfunction of cervical region: Secondary | ICD-10-CM | POA: Diagnosis not present

## 2015-08-19 DIAGNOSIS — M542 Cervicalgia: Secondary | ICD-10-CM | POA: Diagnosis not present

## 2015-08-19 DIAGNOSIS — M9901 Segmental and somatic dysfunction of cervical region: Secondary | ICD-10-CM | POA: Diagnosis not present

## 2015-08-19 DIAGNOSIS — M9902 Segmental and somatic dysfunction of thoracic region: Secondary | ICD-10-CM | POA: Diagnosis not present

## 2015-08-19 DIAGNOSIS — M546 Pain in thoracic spine: Secondary | ICD-10-CM | POA: Diagnosis not present

## 2015-08-21 DIAGNOSIS — M9902 Segmental and somatic dysfunction of thoracic region: Secondary | ICD-10-CM | POA: Diagnosis not present

## 2015-08-21 DIAGNOSIS — M542 Cervicalgia: Secondary | ICD-10-CM | POA: Diagnosis not present

## 2015-08-21 DIAGNOSIS — M9901 Segmental and somatic dysfunction of cervical region: Secondary | ICD-10-CM | POA: Diagnosis not present

## 2015-08-21 DIAGNOSIS — M546 Pain in thoracic spine: Secondary | ICD-10-CM | POA: Diagnosis not present

## 2015-08-28 DIAGNOSIS — M9901 Segmental and somatic dysfunction of cervical region: Secondary | ICD-10-CM | POA: Diagnosis not present

## 2015-08-28 DIAGNOSIS — M546 Pain in thoracic spine: Secondary | ICD-10-CM | POA: Diagnosis not present

## 2015-08-28 DIAGNOSIS — M542 Cervicalgia: Secondary | ICD-10-CM | POA: Diagnosis not present

## 2015-08-28 DIAGNOSIS — M9902 Segmental and somatic dysfunction of thoracic region: Secondary | ICD-10-CM | POA: Diagnosis not present

## 2015-09-01 DIAGNOSIS — H353132 Nonexudative age-related macular degeneration, bilateral, intermediate dry stage: Secondary | ICD-10-CM | POA: Diagnosis not present

## 2015-09-01 DIAGNOSIS — M546 Pain in thoracic spine: Secondary | ICD-10-CM | POA: Diagnosis not present

## 2015-09-01 DIAGNOSIS — M542 Cervicalgia: Secondary | ICD-10-CM | POA: Diagnosis not present

## 2015-09-01 DIAGNOSIS — M9901 Segmental and somatic dysfunction of cervical region: Secondary | ICD-10-CM | POA: Diagnosis not present

## 2015-09-01 DIAGNOSIS — M9902 Segmental and somatic dysfunction of thoracic region: Secondary | ICD-10-CM | POA: Diagnosis not present

## 2015-09-07 DIAGNOSIS — M9901 Segmental and somatic dysfunction of cervical region: Secondary | ICD-10-CM | POA: Diagnosis not present

## 2015-09-07 DIAGNOSIS — M546 Pain in thoracic spine: Secondary | ICD-10-CM | POA: Diagnosis not present

## 2015-09-07 DIAGNOSIS — M9902 Segmental and somatic dysfunction of thoracic region: Secondary | ICD-10-CM | POA: Diagnosis not present

## 2015-09-07 DIAGNOSIS — M542 Cervicalgia: Secondary | ICD-10-CM | POA: Diagnosis not present

## 2015-09-09 DIAGNOSIS — M9901 Segmental and somatic dysfunction of cervical region: Secondary | ICD-10-CM | POA: Diagnosis not present

## 2015-09-09 DIAGNOSIS — M9902 Segmental and somatic dysfunction of thoracic region: Secondary | ICD-10-CM | POA: Diagnosis not present

## 2015-09-09 DIAGNOSIS — M542 Cervicalgia: Secondary | ICD-10-CM | POA: Diagnosis not present

## 2015-09-09 DIAGNOSIS — M546 Pain in thoracic spine: Secondary | ICD-10-CM | POA: Diagnosis not present

## 2015-09-11 DIAGNOSIS — M546 Pain in thoracic spine: Secondary | ICD-10-CM | POA: Diagnosis not present

## 2015-09-11 DIAGNOSIS — M542 Cervicalgia: Secondary | ICD-10-CM | POA: Diagnosis not present

## 2015-09-11 DIAGNOSIS — M9902 Segmental and somatic dysfunction of thoracic region: Secondary | ICD-10-CM | POA: Diagnosis not present

## 2015-09-11 DIAGNOSIS — M9901 Segmental and somatic dysfunction of cervical region: Secondary | ICD-10-CM | POA: Diagnosis not present

## 2015-09-16 DIAGNOSIS — M546 Pain in thoracic spine: Secondary | ICD-10-CM | POA: Diagnosis not present

## 2015-09-16 DIAGNOSIS — M9901 Segmental and somatic dysfunction of cervical region: Secondary | ICD-10-CM | POA: Diagnosis not present

## 2015-09-16 DIAGNOSIS — M542 Cervicalgia: Secondary | ICD-10-CM | POA: Diagnosis not present

## 2015-09-16 DIAGNOSIS — M9902 Segmental and somatic dysfunction of thoracic region: Secondary | ICD-10-CM | POA: Diagnosis not present

## 2015-09-18 DIAGNOSIS — M9901 Segmental and somatic dysfunction of cervical region: Secondary | ICD-10-CM | POA: Diagnosis not present

## 2015-09-18 DIAGNOSIS — M546 Pain in thoracic spine: Secondary | ICD-10-CM | POA: Diagnosis not present

## 2015-09-18 DIAGNOSIS — M9902 Segmental and somatic dysfunction of thoracic region: Secondary | ICD-10-CM | POA: Diagnosis not present

## 2015-09-18 DIAGNOSIS — M542 Cervicalgia: Secondary | ICD-10-CM | POA: Diagnosis not present

## 2015-09-22 DIAGNOSIS — M542 Cervicalgia: Secondary | ICD-10-CM | POA: Diagnosis not present

## 2015-09-22 DIAGNOSIS — M546 Pain in thoracic spine: Secondary | ICD-10-CM | POA: Diagnosis not present

## 2015-09-22 DIAGNOSIS — M9902 Segmental and somatic dysfunction of thoracic region: Secondary | ICD-10-CM | POA: Diagnosis not present

## 2015-09-22 DIAGNOSIS — M9901 Segmental and somatic dysfunction of cervical region: Secondary | ICD-10-CM | POA: Diagnosis not present

## 2015-09-25 DIAGNOSIS — M542 Cervicalgia: Secondary | ICD-10-CM | POA: Diagnosis not present

## 2015-09-25 DIAGNOSIS — M9902 Segmental and somatic dysfunction of thoracic region: Secondary | ICD-10-CM | POA: Diagnosis not present

## 2015-09-25 DIAGNOSIS — M9901 Segmental and somatic dysfunction of cervical region: Secondary | ICD-10-CM | POA: Diagnosis not present

## 2015-09-25 DIAGNOSIS — M546 Pain in thoracic spine: Secondary | ICD-10-CM | POA: Diagnosis not present

## 2015-09-29 DIAGNOSIS — M542 Cervicalgia: Secondary | ICD-10-CM | POA: Diagnosis not present

## 2015-09-29 DIAGNOSIS — M546 Pain in thoracic spine: Secondary | ICD-10-CM | POA: Diagnosis not present

## 2015-09-29 DIAGNOSIS — M9901 Segmental and somatic dysfunction of cervical region: Secondary | ICD-10-CM | POA: Diagnosis not present

## 2015-09-29 DIAGNOSIS — M9902 Segmental and somatic dysfunction of thoracic region: Secondary | ICD-10-CM | POA: Diagnosis not present

## 2015-10-01 DIAGNOSIS — H353132 Nonexudative age-related macular degeneration, bilateral, intermediate dry stage: Secondary | ICD-10-CM | POA: Diagnosis not present

## 2015-10-07 ENCOUNTER — Ambulatory Visit
Admission: RE | Admit: 2015-10-07 | Discharge: 2015-10-07 | Disposition: A | Discharge status: Home / Self Care | Payer: Medicare Other | Source: Ambulatory Visit

## 2015-10-07 DIAGNOSIS — Z1231 Encounter for screening mammogram for malignant neoplasm of breast: Secondary | ICD-10-CM | POA: Diagnosis not present

## 2015-10-08 DIAGNOSIS — M546 Pain in thoracic spine: Secondary | ICD-10-CM | POA: Diagnosis not present

## 2015-10-08 DIAGNOSIS — M9901 Segmental and somatic dysfunction of cervical region: Secondary | ICD-10-CM | POA: Diagnosis not present

## 2015-10-08 DIAGNOSIS — E039 Hypothyroidism, unspecified: Secondary | ICD-10-CM | POA: Diagnosis not present

## 2015-10-08 DIAGNOSIS — M9902 Segmental and somatic dysfunction of thoracic region: Secondary | ICD-10-CM | POA: Diagnosis not present

## 2015-10-08 DIAGNOSIS — M542 Cervicalgia: Secondary | ICD-10-CM | POA: Diagnosis not present

## 2015-10-13 DIAGNOSIS — M542 Cervicalgia: Secondary | ICD-10-CM | POA: Diagnosis not present

## 2015-10-13 DIAGNOSIS — M9902 Segmental and somatic dysfunction of thoracic region: Secondary | ICD-10-CM | POA: Diagnosis not present

## 2015-10-13 DIAGNOSIS — M546 Pain in thoracic spine: Secondary | ICD-10-CM | POA: Diagnosis not present

## 2015-10-13 DIAGNOSIS — M9901 Segmental and somatic dysfunction of cervical region: Secondary | ICD-10-CM | POA: Diagnosis not present

## 2015-10-20 DIAGNOSIS — M542 Cervicalgia: Secondary | ICD-10-CM | POA: Diagnosis not present

## 2015-10-20 DIAGNOSIS — M9901 Segmental and somatic dysfunction of cervical region: Secondary | ICD-10-CM | POA: Diagnosis not present

## 2015-10-20 DIAGNOSIS — M546 Pain in thoracic spine: Secondary | ICD-10-CM | POA: Diagnosis not present

## 2015-10-20 DIAGNOSIS — M9902 Segmental and somatic dysfunction of thoracic region: Secondary | ICD-10-CM | POA: Diagnosis not present

## 2015-11-23 DIAGNOSIS — M542 Cervicalgia: Secondary | ICD-10-CM | POA: Diagnosis not present

## 2015-11-23 DIAGNOSIS — M9902 Segmental and somatic dysfunction of thoracic region: Secondary | ICD-10-CM | POA: Diagnosis not present

## 2015-11-23 DIAGNOSIS — M9901 Segmental and somatic dysfunction of cervical region: Secondary | ICD-10-CM | POA: Diagnosis not present

## 2015-11-23 DIAGNOSIS — M546 Pain in thoracic spine: Secondary | ICD-10-CM | POA: Diagnosis not present

## 2015-11-27 DIAGNOSIS — M9901 Segmental and somatic dysfunction of cervical region: Secondary | ICD-10-CM | POA: Diagnosis not present

## 2015-11-27 DIAGNOSIS — M9902 Segmental and somatic dysfunction of thoracic region: Secondary | ICD-10-CM | POA: Diagnosis not present

## 2015-11-27 DIAGNOSIS — M542 Cervicalgia: Secondary | ICD-10-CM | POA: Diagnosis not present

## 2015-11-27 DIAGNOSIS — M546 Pain in thoracic spine: Secondary | ICD-10-CM | POA: Diagnosis not present

## 2015-12-02 DIAGNOSIS — M9901 Segmental and somatic dysfunction of cervical region: Secondary | ICD-10-CM | POA: Diagnosis not present

## 2015-12-02 DIAGNOSIS — M546 Pain in thoracic spine: Secondary | ICD-10-CM | POA: Diagnosis not present

## 2015-12-02 DIAGNOSIS — M9902 Segmental and somatic dysfunction of thoracic region: Secondary | ICD-10-CM | POA: Diagnosis not present

## 2015-12-02 DIAGNOSIS — M542 Cervicalgia: Secondary | ICD-10-CM | POA: Diagnosis not present

## 2015-12-03 DIAGNOSIS — H353132 Nonexudative age-related macular degeneration, bilateral, intermediate dry stage: Secondary | ICD-10-CM | POA: Diagnosis not present

## 2015-12-08 DIAGNOSIS — M25552 Pain in left hip: Secondary | ICD-10-CM | POA: Diagnosis not present

## 2015-12-09 DIAGNOSIS — M9902 Segmental and somatic dysfunction of thoracic region: Secondary | ICD-10-CM | POA: Diagnosis not present

## 2015-12-09 DIAGNOSIS — M542 Cervicalgia: Secondary | ICD-10-CM | POA: Diagnosis not present

## 2015-12-09 DIAGNOSIS — M546 Pain in thoracic spine: Secondary | ICD-10-CM | POA: Diagnosis not present

## 2015-12-09 DIAGNOSIS — M9901 Segmental and somatic dysfunction of cervical region: Secondary | ICD-10-CM | POA: Diagnosis not present

## 2015-12-10 DIAGNOSIS — H524 Presbyopia: Secondary | ICD-10-CM | POA: Diagnosis not present

## 2015-12-10 DIAGNOSIS — H26492 Other secondary cataract, left eye: Secondary | ICD-10-CM | POA: Diagnosis not present

## 2015-12-10 DIAGNOSIS — H04123 Dry eye syndrome of bilateral lacrimal glands: Secondary | ICD-10-CM | POA: Diagnosis not present

## 2015-12-10 DIAGNOSIS — H353132 Nonexudative age-related macular degeneration, bilateral, intermediate dry stage: Secondary | ICD-10-CM | POA: Diagnosis not present

## 2015-12-23 DIAGNOSIS — M1612 Unilateral primary osteoarthritis, left hip: Secondary | ICD-10-CM | POA: Diagnosis not present

## 2016-01-02 DIAGNOSIS — H353132 Nonexudative age-related macular degeneration, bilateral, intermediate dry stage: Secondary | ICD-10-CM | POA: Diagnosis not present

## 2016-01-04 DIAGNOSIS — R109 Unspecified abdominal pain: Secondary | ICD-10-CM | POA: Diagnosis not present

## 2016-01-04 DIAGNOSIS — R8299 Other abnormal findings in urine: Secondary | ICD-10-CM | POA: Diagnosis not present

## 2016-01-04 DIAGNOSIS — R110 Nausea: Secondary | ICD-10-CM | POA: Diagnosis not present

## 2016-01-04 DIAGNOSIS — R1013 Epigastric pain: Secondary | ICD-10-CM | POA: Diagnosis not present

## 2016-01-13 DIAGNOSIS — H43813 Vitreous degeneration, bilateral: Secondary | ICD-10-CM | POA: Diagnosis not present

## 2016-01-13 DIAGNOSIS — H353133 Nonexudative age-related macular degeneration, bilateral, advanced atrophic without subfoveal involvement: Secondary | ICD-10-CM | POA: Diagnosis not present

## 2016-04-01 DIAGNOSIS — H353132 Nonexudative age-related macular degeneration, bilateral, intermediate dry stage: Secondary | ICD-10-CM | POA: Diagnosis not present

## 2016-04-08 DIAGNOSIS — M25552 Pain in left hip: Secondary | ICD-10-CM | POA: Diagnosis not present

## 2016-04-08 DIAGNOSIS — F329 Major depressive disorder, single episode, unspecified: Secondary | ICD-10-CM | POA: Diagnosis not present

## 2016-04-08 DIAGNOSIS — R197 Diarrhea, unspecified: Secondary | ICD-10-CM | POA: Diagnosis not present

## 2016-04-08 DIAGNOSIS — G2581 Restless legs syndrome: Secondary | ICD-10-CM | POA: Diagnosis not present

## 2016-05-02 DIAGNOSIS — H353132 Nonexudative age-related macular degeneration, bilateral, intermediate dry stage: Secondary | ICD-10-CM | POA: Diagnosis not present

## 2016-05-12 DIAGNOSIS — M1612 Unilateral primary osteoarthritis, left hip: Secondary | ICD-10-CM | POA: Diagnosis not present

## 2016-05-12 DIAGNOSIS — M25552 Pain in left hip: Secondary | ICD-10-CM | POA: Diagnosis not present

## 2016-05-24 DIAGNOSIS — H9202 Otalgia, left ear: Secondary | ICD-10-CM | POA: Diagnosis not present

## 2016-05-24 DIAGNOSIS — R197 Diarrhea, unspecified: Secondary | ICD-10-CM | POA: Diagnosis not present

## 2016-05-24 DIAGNOSIS — R110 Nausea: Secondary | ICD-10-CM | POA: Diagnosis not present

## 2016-05-24 DIAGNOSIS — R682 Dry mouth, unspecified: Secondary | ICD-10-CM | POA: Diagnosis not present

## 2016-05-24 DIAGNOSIS — I10 Essential (primary) hypertension: Secondary | ICD-10-CM | POA: Diagnosis not present

## 2016-05-24 DIAGNOSIS — E039 Hypothyroidism, unspecified: Secondary | ICD-10-CM | POA: Diagnosis not present

## 2016-05-24 DIAGNOSIS — M1612 Unilateral primary osteoarthritis, left hip: Secondary | ICD-10-CM | POA: Diagnosis not present

## 2016-06-01 DIAGNOSIS — H353122 Nonexudative age-related macular degeneration, left eye, intermediate dry stage: Secondary | ICD-10-CM | POA: Diagnosis not present

## 2016-06-17 DIAGNOSIS — H353211 Exudative age-related macular degeneration, right eye, with active choroidal neovascularization: Secondary | ICD-10-CM | POA: Diagnosis not present

## 2016-06-17 DIAGNOSIS — H353123 Nonexudative age-related macular degeneration, left eye, advanced atrophic without subfoveal involvement: Secondary | ICD-10-CM | POA: Diagnosis not present

## 2016-06-17 DIAGNOSIS — H43813 Vitreous degeneration, bilateral: Secondary | ICD-10-CM | POA: Diagnosis not present

## 2016-06-22 DIAGNOSIS — M25552 Pain in left hip: Secondary | ICD-10-CM | POA: Diagnosis not present

## 2016-06-24 DIAGNOSIS — R197 Diarrhea, unspecified: Secondary | ICD-10-CM | POA: Diagnosis not present

## 2016-06-24 DIAGNOSIS — I10 Essential (primary) hypertension: Secondary | ICD-10-CM | POA: Diagnosis not present

## 2016-06-24 DIAGNOSIS — H6122 Impacted cerumen, left ear: Secondary | ICD-10-CM | POA: Diagnosis not present

## 2016-06-28 DIAGNOSIS — E039 Hypothyroidism, unspecified: Secondary | ICD-10-CM | POA: Diagnosis not present

## 2016-06-28 DIAGNOSIS — Z8673 Personal history of transient ischemic attack (TIA), and cerebral infarction without residual deficits: Secondary | ICD-10-CM | POA: Diagnosis not present

## 2016-06-28 DIAGNOSIS — E785 Hyperlipidemia, unspecified: Secondary | ICD-10-CM | POA: Diagnosis not present

## 2016-06-28 DIAGNOSIS — R197 Diarrhea, unspecified: Secondary | ICD-10-CM | POA: Diagnosis not present

## 2016-06-28 DIAGNOSIS — K219 Gastro-esophageal reflux disease without esophagitis: Secondary | ICD-10-CM | POA: Diagnosis not present

## 2016-07-01 DIAGNOSIS — H353122 Nonexudative age-related macular degeneration, left eye, intermediate dry stage: Secondary | ICD-10-CM | POA: Diagnosis not present

## 2016-07-12 DIAGNOSIS — R197 Diarrhea, unspecified: Secondary | ICD-10-CM | POA: Diagnosis not present

## 2016-07-12 DIAGNOSIS — K219 Gastro-esophageal reflux disease without esophagitis: Secondary | ICD-10-CM | POA: Diagnosis not present

## 2016-07-12 DIAGNOSIS — I10 Essential (primary) hypertension: Secondary | ICD-10-CM | POA: Diagnosis not present

## 2016-07-12 DIAGNOSIS — H6123 Impacted cerumen, bilateral: Secondary | ICD-10-CM | POA: Diagnosis not present

## 2016-07-20 DIAGNOSIS — H353123 Nonexudative age-related macular degeneration, left eye, advanced atrophic without subfoveal involvement: Secondary | ICD-10-CM | POA: Diagnosis not present

## 2016-07-20 DIAGNOSIS — H43813 Vitreous degeneration, bilateral: Secondary | ICD-10-CM | POA: Diagnosis not present

## 2016-07-20 DIAGNOSIS — H353211 Exudative age-related macular degeneration, right eye, with active choroidal neovascularization: Secondary | ICD-10-CM | POA: Diagnosis not present

## 2016-08-05 DIAGNOSIS — E039 Hypothyroidism, unspecified: Secondary | ICD-10-CM | POA: Diagnosis not present

## 2016-08-10 DIAGNOSIS — T148XXA Other injury of unspecified body region, initial encounter: Secondary | ICD-10-CM | POA: Diagnosis not present

## 2016-08-10 DIAGNOSIS — I872 Venous insufficiency (chronic) (peripheral): Secondary | ICD-10-CM | POA: Diagnosis not present

## 2016-08-10 DIAGNOSIS — I10 Essential (primary) hypertension: Secondary | ICD-10-CM | POA: Diagnosis not present

## 2016-08-10 DIAGNOSIS — R600 Localized edema: Secondary | ICD-10-CM | POA: Diagnosis not present

## 2016-08-10 DIAGNOSIS — E039 Hypothyroidism, unspecified: Secondary | ICD-10-CM | POA: Diagnosis not present

## 2016-08-17 DIAGNOSIS — S32000A Wedge compression fracture of unspecified lumbar vertebra, initial encounter for closed fracture: Secondary | ICD-10-CM | POA: Diagnosis not present

## 2016-08-17 DIAGNOSIS — S63501A Unspecified sprain of right wrist, initial encounter: Secondary | ICD-10-CM | POA: Diagnosis not present

## 2016-08-18 DIAGNOSIS — H353211 Exudative age-related macular degeneration, right eye, with active choroidal neovascularization: Secondary | ICD-10-CM | POA: Diagnosis not present

## 2016-08-18 DIAGNOSIS — H43813 Vitreous degeneration, bilateral: Secondary | ICD-10-CM | POA: Diagnosis not present

## 2016-08-18 DIAGNOSIS — H353123 Nonexudative age-related macular degeneration, left eye, advanced atrophic without subfoveal involvement: Secondary | ICD-10-CM | POA: Diagnosis not present

## 2016-08-25 DIAGNOSIS — M1612 Unilateral primary osteoarthritis, left hip: Secondary | ICD-10-CM | POA: Diagnosis not present

## 2016-08-25 DIAGNOSIS — M545 Low back pain: Secondary | ICD-10-CM | POA: Diagnosis not present

## 2016-08-30 DIAGNOSIS — H353122 Nonexudative age-related macular degeneration, left eye, intermediate dry stage: Secondary | ICD-10-CM | POA: Diagnosis not present

## 2016-09-09 DIAGNOSIS — M545 Low back pain: Secondary | ICD-10-CM | POA: Diagnosis not present

## 2016-09-09 DIAGNOSIS — I10 Essential (primary) hypertension: Secondary | ICD-10-CM | POA: Diagnosis not present

## 2016-09-09 DIAGNOSIS — I872 Venous insufficiency (chronic) (peripheral): Secondary | ICD-10-CM | POA: Diagnosis not present

## 2016-09-09 DIAGNOSIS — G8929 Other chronic pain: Secondary | ICD-10-CM | POA: Diagnosis not present

## 2016-09-16 DIAGNOSIS — H353211 Exudative age-related macular degeneration, right eye, with active choroidal neovascularization: Secondary | ICD-10-CM | POA: Diagnosis not present

## 2016-09-16 DIAGNOSIS — H353124 Nonexudative age-related macular degeneration, left eye, advanced atrophic with subfoveal involvement: Secondary | ICD-10-CM | POA: Diagnosis not present

## 2016-09-16 DIAGNOSIS — H43813 Vitreous degeneration, bilateral: Secondary | ICD-10-CM | POA: Diagnosis not present

## 2016-09-29 DIAGNOSIS — H353122 Nonexudative age-related macular degeneration, left eye, intermediate dry stage: Secondary | ICD-10-CM | POA: Diagnosis not present

## 2016-10-05 ENCOUNTER — Other Ambulatory Visit: Payer: Self-pay | Admitting: Family Medicine

## 2016-10-05 DIAGNOSIS — Z1231 Encounter for screening mammogram for malignant neoplasm of breast: Secondary | ICD-10-CM

## 2016-10-14 DIAGNOSIS — H353211 Exudative age-related macular degeneration, right eye, with active choroidal neovascularization: Secondary | ICD-10-CM | POA: Diagnosis not present

## 2016-10-14 DIAGNOSIS — H353124 Nonexudative age-related macular degeneration, left eye, advanced atrophic with subfoveal involvement: Secondary | ICD-10-CM | POA: Diagnosis not present

## 2016-10-14 DIAGNOSIS — H43813 Vitreous degeneration, bilateral: Secondary | ICD-10-CM | POA: Diagnosis not present

## 2016-10-20 DIAGNOSIS — H43813 Vitreous degeneration, bilateral: Secondary | ICD-10-CM | POA: Diagnosis not present

## 2016-10-20 DIAGNOSIS — H18831 Recurrent erosion of cornea, right eye: Secondary | ICD-10-CM | POA: Diagnosis not present

## 2016-10-21 ENCOUNTER — Ambulatory Visit: Payer: Medicare Other

## 2016-11-21 DIAGNOSIS — H353211 Exudative age-related macular degeneration, right eye, with active choroidal neovascularization: Secondary | ICD-10-CM | POA: Diagnosis not present

## 2016-11-21 DIAGNOSIS — H353123 Nonexudative age-related macular degeneration, left eye, advanced atrophic without subfoveal involvement: Secondary | ICD-10-CM | POA: Diagnosis not present

## 2016-11-21 DIAGNOSIS — H43813 Vitreous degeneration, bilateral: Secondary | ICD-10-CM | POA: Diagnosis not present

## 2016-11-24 DIAGNOSIS — M1712 Unilateral primary osteoarthritis, left knee: Secondary | ICD-10-CM | POA: Diagnosis not present

## 2016-12-14 DIAGNOSIS — M1712 Unilateral primary osteoarthritis, left knee: Secondary | ICD-10-CM | POA: Diagnosis not present

## 2016-12-14 DIAGNOSIS — R252 Cramp and spasm: Secondary | ICD-10-CM | POA: Diagnosis not present

## 2016-12-14 DIAGNOSIS — H6063 Unspecified chronic otitis externa, bilateral: Secondary | ICD-10-CM | POA: Diagnosis not present

## 2016-12-14 DIAGNOSIS — I10 Essential (primary) hypertension: Secondary | ICD-10-CM | POA: Diagnosis not present

## 2016-12-14 DIAGNOSIS — G4700 Insomnia, unspecified: Secondary | ICD-10-CM | POA: Diagnosis not present

## 2016-12-14 DIAGNOSIS — E039 Hypothyroidism, unspecified: Secondary | ICD-10-CM | POA: Diagnosis not present

## 2016-12-16 ENCOUNTER — Inpatient Hospital Stay (HOSPITAL_COMMUNITY)
Admission: EM | Admit: 2016-12-16 | Discharge: 2016-12-19 | DRG: 372 | Disposition: A | Discharge status: Home / Self Care | Payer: Medicare HMO | Attending: Internal Medicine | Admitting: Internal Medicine

## 2016-12-16 ENCOUNTER — Emergency Department (HOSPITAL_COMMUNITY): Payer: Medicare HMO

## 2016-12-16 ENCOUNTER — Encounter (HOSPITAL_COMMUNITY): Payer: Self-pay | Admitting: Emergency Medicine

## 2016-12-16 DIAGNOSIS — E785 Hyperlipidemia, unspecified: Secondary | ICD-10-CM | POA: Diagnosis present

## 2016-12-16 DIAGNOSIS — E860 Dehydration: Secondary | ICD-10-CM | POA: Diagnosis not present

## 2016-12-16 DIAGNOSIS — Z79899 Other long term (current) drug therapy: Secondary | ICD-10-CM | POA: Diagnosis not present

## 2016-12-16 DIAGNOSIS — Z888 Allergy status to other drugs, medicaments and biological substances status: Secondary | ICD-10-CM

## 2016-12-16 DIAGNOSIS — Z87891 Personal history of nicotine dependence: Secondary | ICD-10-CM | POA: Diagnosis not present

## 2016-12-16 DIAGNOSIS — A09 Infectious gastroenteritis and colitis, unspecified: Secondary | ICD-10-CM | POA: Diagnosis not present

## 2016-12-16 DIAGNOSIS — E871 Hypo-osmolality and hyponatremia: Secondary | ICD-10-CM | POA: Diagnosis present

## 2016-12-16 DIAGNOSIS — E876 Hypokalemia: Secondary | ICD-10-CM

## 2016-12-16 DIAGNOSIS — Z7982 Long term (current) use of aspirin: Secondary | ICD-10-CM | POA: Diagnosis not present

## 2016-12-16 DIAGNOSIS — Z96651 Presence of right artificial knee joint: Secondary | ICD-10-CM | POA: Diagnosis not present

## 2016-12-16 DIAGNOSIS — Z8719 Personal history of other diseases of the digestive system: Secondary | ICD-10-CM

## 2016-12-16 DIAGNOSIS — K529 Noninfective gastroenteritis and colitis, unspecified: Secondary | ICD-10-CM

## 2016-12-16 DIAGNOSIS — I10 Essential (primary) hypertension: Secondary | ICD-10-CM | POA: Diagnosis not present

## 2016-12-16 DIAGNOSIS — R740 Nonspecific elevation of levels of transaminase and lactic acid dehydrogenase [LDH]: Secondary | ICD-10-CM | POA: Diagnosis not present

## 2016-12-16 DIAGNOSIS — R7989 Other specified abnormal findings of blood chemistry: Secondary | ICD-10-CM

## 2016-12-16 DIAGNOSIS — E039 Hypothyroidism, unspecified: Secondary | ICD-10-CM | POA: Diagnosis not present

## 2016-12-16 DIAGNOSIS — Z8673 Personal history of transient ischemic attack (TIA), and cerebral infarction without residual deficits: Secondary | ICD-10-CM | POA: Diagnosis not present

## 2016-12-16 DIAGNOSIS — A045 Campylobacter enteritis: Secondary | ICD-10-CM | POA: Diagnosis not present

## 2016-12-16 DIAGNOSIS — N200 Calculus of kidney: Secondary | ICD-10-CM | POA: Diagnosis present

## 2016-12-16 DIAGNOSIS — D62 Acute posthemorrhagic anemia: Secondary | ICD-10-CM | POA: Diagnosis not present

## 2016-12-16 DIAGNOSIS — E861 Hypovolemia: Secondary | ICD-10-CM | POA: Diagnosis present

## 2016-12-16 DIAGNOSIS — F329 Major depressive disorder, single episode, unspecified: Secondary | ICD-10-CM | POA: Diagnosis present

## 2016-12-16 HISTORY — DX: Personal history of other diseases of the digestive system: Z87.19

## 2016-12-16 HISTORY — DX: Gastro-esophageal reflux disease without esophagitis: K21.9

## 2016-12-16 LAB — COMPREHENSIVE METABOLIC PANEL
ALT: 208 U/L — ABNORMAL HIGH (ref 14–54)
ANION GAP: 10 (ref 5–15)
AST: 105 U/L — ABNORMAL HIGH (ref 15–41)
Albumin: 3.8 g/dL (ref 3.5–5.0)
Alkaline Phosphatase: 212 U/L — ABNORMAL HIGH (ref 38–126)
BILIRUBIN TOTAL: 0.6 mg/dL (ref 0.3–1.2)
BUN: 6 mg/dL (ref 6–20)
CALCIUM: 9.1 mg/dL (ref 8.9–10.3)
CO2: 22 mmol/L (ref 22–32)
Chloride: 97 mmol/L — ABNORMAL LOW (ref 101–111)
Creatinine, Ser: 0.8 mg/dL (ref 0.44–1.00)
Glucose, Bld: 137 mg/dL — ABNORMAL HIGH (ref 65–99)
POTASSIUM: 3.4 mmol/L — AB (ref 3.5–5.1)
Sodium: 129 mmol/L — ABNORMAL LOW (ref 135–145)
TOTAL PROTEIN: 6.5 g/dL (ref 6.5–8.1)

## 2016-12-16 LAB — URINALYSIS, ROUTINE W REFLEX MICROSCOPIC
Bilirubin Urine: NEGATIVE
GLUCOSE, UA: NEGATIVE mg/dL
HGB URINE DIPSTICK: NEGATIVE
KETONES UR: 5 mg/dL — AB
LEUKOCYTES UA: NEGATIVE
NITRITE: NEGATIVE
PH: 7 (ref 5.0–8.0)
PROTEIN: 100 mg/dL — AB
Specific Gravity, Urine: 1.016 (ref 1.005–1.030)

## 2016-12-16 LAB — LIPASE, BLOOD: Lipase: 29 U/L (ref 11–51)

## 2016-12-16 LAB — C DIFFICILE QUICK SCREEN W PCR REFLEX
C DIFFICILE (CDIFF) TOXIN: NEGATIVE
C DIFFICLE (CDIFF) ANTIGEN: NEGATIVE
C Diff interpretation: NOT DETECTED

## 2016-12-16 LAB — CBC
HEMATOCRIT: 36.2 % (ref 36.0–46.0)
HEMOGLOBIN: 12.4 g/dL (ref 12.0–15.0)
MCH: 30 pg (ref 26.0–34.0)
MCHC: 34.3 g/dL (ref 30.0–36.0)
MCV: 87.7 fL (ref 78.0–100.0)
Platelets: 309 10*3/uL (ref 150–400)
RBC: 4.13 MIL/uL (ref 3.87–5.11)
RDW: 14.2 % (ref 11.5–15.5)
WBC: 10.6 10*3/uL — AB (ref 4.0–10.5)

## 2016-12-16 LAB — I-STAT CG4 LACTIC ACID, ED: Lactic Acid, Venous: 0.88 mmol/L (ref 0.5–1.9)

## 2016-12-16 LAB — POC OCCULT BLOOD, ED: Fecal Occult Bld: POSITIVE — AB

## 2016-12-16 MED ORDER — LEVOTHYROXINE SODIUM 25 MCG PO TABS
137.0000 ug | ORAL_TABLET | Freq: Every day | ORAL | Status: DC
  Administered 2016-12-17 – 2016-12-19 (×3): 137 ug via ORAL
  Filled 2016-12-16 (×4): qty 1

## 2016-12-16 MED ORDER — ONDANSETRON HCL 4 MG/2ML IJ SOLN: 4.0000 mg | Freq: Once | INTRAMUSCULAR | Status: DC

## 2016-12-16 MED ORDER — TRAZODONE HCL 50 MG PO TABS
50.0000 mg | ORAL_TABLET | Freq: Every day | ORAL | Status: DC
  Administered 2016-12-16 – 2016-12-18 (×3): 50 mg via ORAL
  Filled 2016-12-16 (×3): qty 1

## 2016-12-16 MED ORDER — ASPIRIN 325 MG PO TABS
325.0000 mg | ORAL_TABLET | Freq: Every day | ORAL | Status: DC
  Administered 2016-12-16: 325 mg via ORAL
  Filled 2016-12-16 (×2): qty 1

## 2016-12-16 MED ORDER — SODIUM CHLORIDE 0.9 % IV BOLUS (SEPSIS)
1000.0000 mL | Freq: Once | INTRAVENOUS | Status: AC
  Administered 2016-12-16: 1000 mL via INTRAVENOUS

## 2016-12-16 MED ORDER — CIPROFLOXACIN IN D5W 400 MG/200ML IV SOLN
400.0000 mg | Freq: Once | INTRAVENOUS | Status: AC
  Administered 2016-12-16: 400 mg via INTRAVENOUS
  Filled 2016-12-16: qty 200

## 2016-12-16 MED ORDER — AMLODIPINE BESYLATE 5 MG PO TABS
5.0000 mg | ORAL_TABLET | Freq: Every day | ORAL | Status: DC
  Administered 2016-12-16 – 2016-12-19 (×4): 5 mg via ORAL
  Filled 2016-12-16 (×4): qty 1

## 2016-12-16 MED ORDER — SODIUM CHLORIDE 0.9 % IV SOLN
INTRAVENOUS | Status: DC
  Administered 2016-12-16 – 2016-12-18 (×5): via INTRAVENOUS

## 2016-12-16 MED ORDER — CIPROFLOXACIN IN D5W 400 MG/200ML IV SOLN
400.0000 mg | Freq: Two times a day (BID) | INTRAVENOUS | Status: DC
  Administered 2016-12-17: 400 mg via INTRAVENOUS
  Filled 2016-12-16 (×2): qty 200

## 2016-12-16 MED ORDER — METRONIDAZOLE IN NACL 5-0.79 MG/ML-% IV SOLN
500.0000 mg | Freq: Once | INTRAVENOUS | Status: AC
  Administered 2016-12-16: 500 mg via INTRAVENOUS
  Filled 2016-12-16: qty 100

## 2016-12-16 MED ORDER — MORPHINE SULFATE (PF) 4 MG/ML IV SOLN
2.0000 mg | Freq: Once | INTRAVENOUS | Status: AC
  Administered 2016-12-18: 2 mg via INTRAVENOUS
  Filled 2016-12-16: qty 1

## 2016-12-16 MED ORDER — SERTRALINE HCL 50 MG PO TABS
25.0000 mg | ORAL_TABLET | Freq: Every day | ORAL | Status: DC
Start: 2016-12-16 — End: 2016-12-19
  Administered 2016-12-16 – 2016-12-18 (×3): 25 mg via ORAL
  Filled 2016-12-16 (×4): qty 1

## 2016-12-16 MED ORDER — POTASSIUM CHLORIDE 10 MEQ/100ML IV SOLN
10.0000 meq | INTRAVENOUS | Status: AC
  Administered 2016-12-16 – 2016-12-17 (×4): 10 meq via INTRAVENOUS
  Filled 2016-12-16 (×4): qty 100

## 2016-12-16 MED ORDER — IOPAMIDOL (ISOVUE-300) INJECTION 61%
INTRAVENOUS | Status: AC
  Administered 2016-12-16: 100 mL
  Filled 2016-12-16: qty 100

## 2016-12-16 MED ORDER — ZOLPIDEM TARTRATE 5 MG PO TABS
5.0000 mg | ORAL_TABLET | Freq: Every evening | ORAL | Status: DC | PRN
  Administered 2016-12-16 – 2016-12-17 (×2): 5 mg via ORAL
  Filled 2016-12-16 (×2): qty 1

## 2016-12-16 NOTE — ED Triage Notes (Addendum)
WOKE UP Tuesday WITH ABD PAIN AND DIARRHEA , HAD THE CHILLS  IT HAS CONT SHE HAS TAKEN NO MEDS  FOR IT  TODAY SHE STATES SHE MAY HAVE HAD BLOOD IN IT FEELS WEAK

## 2016-12-16 NOTE — H&P (Signed)
History and Physical  Diane Chambers HYW:[id number] DOB: 29-Dec-1933 DOA: 12/16/2016  Referring physician: EDP PCP: Antony Blackbird, MD   Chief Complaint: abdominal cramping, diarrhea, chills, poor oral intake  HPI: Diane Chambers is a 83 y.o. female   With h/o HTN, TIAx2 presented to [facility] ED due to above complaints. She reports she is in her usual states of health until Wednesday night, she started to have abdominal cramping, diarrhea with bloods, mucus, she has chills, her symptom did not improve, she presented to the hospital. 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  ED course: initial on presentation, she has sinus tachycardia in the low 100's, low temp at 100, she is on room air, bp stable, basic labs wbc 10.6, sodium 129, k 3.4, cr 0.8, elevated lft , normal lipase, Ct ab/pel with iv contrast concerns for " colitis colitis involving the cecum and ascending colon. There may also be some inflammation of the rectosigmoid colon. No evidence of perforation or focal abscess." She is give ns bolus x1liter and started on iv cipro and flagyl, hospitalist called to admit the patient.  Review of Systems:  Detail per HPI, Review of systems are otherwise negative  Past Medical History:  Diagnosis Date  . GERD (gastroesophageal reflux disease)   . Hypertension   . TIA (transient ischemic attack)    times 4   Past Surgical History:  Procedure Laterality Date  . CHOLECYSTECTOMY    . FRACTURE SURGERY    . JOINT REPLACEMENT     right knee   Social History:  reports that she has quit smoking. She has never used smokeless tobacco. She reports that she drinks alcohol. She reports that she does not use drugs. Patient lives at home by herself & is able to participate in activities of daily living independently  Allergies    Allergen Reactions  . Calcium-Containing Compounds Other (See Comments)    Restless legs    Family History  Problem Relation Age of Onset  . Diabetes Mother   . Hyperlipidemia Father       Prior to Admission medications   Medication Sig Start Date End Date Taking? Authorizing Provider  amLODipine (NORVASC) 5 MG tablet Take 5 mg by mouth daily. 11/30/16  Yes [provider]  aspirin 325 MG tablet Take 325 mg by mouth daily.   Yes [provider]  atorvastatin (LIPITOR) 20 MG tablet Take 20 mg by mouth daily.   Yes [provider]  Cholecalciferol (VITAMIN D-3 PO) Take 1 capsule by mouth at bedtime.   Yes [provider]  Cod Liver Oil (COD LIVER PO) Take 1 capsule by mouth at bedtime.   Yes [provider]  hydrochlorothiazide (HYDRODIURIL) 25 MG tablet Take 12.5 mg by mouth daily.    Yes [provider]  levothyroxine (SYNTHROID, LEVOTHROID) 137 MCG tablet Take 137 mcg by mouth daily before breakfast. 12/01/16  Yes [provider]  losartan (COZAAR) 50 MG tablet Take 100 mg by mouth every morning. 09/14/16  Yes [provider]  Multiple Vitamins-Minerals (PRESERVISION AREDS 2) CAPS Take 1 capsule by mouth 2 (two) times daily.   Yes [provider]  omeprazole (PRILOSEC) 20 MG capsule Take 20 mg by mouth 2 (two) times daily before a meal.  11/24/16  Yes [provider]  sertraline (ZOLOFT) 50 MG tablet Take 25 mg by mouth at bedtime. 10/28/16  Yes [provider]  traZODone (DESYREL) 50 MG tablet Take 0.5 tablets (25 mg total) by mouth at bedtime as needed for sleep. Patient taking differently: Take 50 mg by mouth at bedtime.  12/30/14  Yes Eulogio Bear U, DO  VITAMIN A PO Take 1 tablet by mouth at bedtime.    Yes [provider]  folic acid (FOLVITE) 1 MG tablet Take 1 tablet (1 mg total) by mouth daily. Patient not taking: Reported on 12/16/2016 12/30/14   Geradine Girt, DO   levothyroxine (SYNTHROID, LEVOTHROID) 112 MCG tablet Take 112 mcg by mouth daily before breakfast.    [provider]    Physical Exam: BP 134/69   Pulse 93   Temp 100 F (37.8 C) (Oral)   Resp 16   SpO2 98%   General:  NAD, very pleasant, looks younger than stated age Eyes: PERRL ENT: unremarkable Neck: supple, no JVD Cardiovascular: slight sinus tachycardia Respiratory: CTABL Abdomen: mild tenderness right lower quadrant, no guarding, no rebound, positive bowel sounds Skin: no rash Musculoskeletal:  No edema Psychiatric: calm/cooperative Neurologic: no focal findings            Labs on Admission:  Basic Metabolic Panel:  Recent Labs Lab 12/16/16 1229  NA 129*  K 3.4*  CL 97*  CO2 22  GLUCOSE 137*  BUN 6  CREATININE 0.80  CALCIUM 9.1   Liver Function Tests:  Recent Labs Lab 12/16/16 1229  AST 105*  ALT 208*  ALKPHOS 212*  BILITOT 0.6  PROT 6.5  ALBUMIN 3.8    Recent Labs Lab 12/16/16 1229  LIPASE 29   No results for input(s): AMMONIA in the last 168 hours. CBC:  Recent Labs Lab 12/16/16 1229  WBC 10.6*  HGB 12.4  HCT 36.2  MCV 87.7  PLT 309   Cardiac Enzymes: No results for input(s): CKTOTAL, CKMB, CKMBINDEX, TROPONINI in the last 168 hours.  BNP (last 3 results) No results for input(s): BNP in the last 8760 hours.  ProBNP (last 3 results) No results for input(s): PROBNP in the last 8760 hours.  CBG: No results for input(s): GLUCAP in the last 168 hours.  Radiological Exams on Admission: Ct Abdomen Pelvis W Contrast  Result Date: 12/16/2016 CLINICAL DATA:  Diarrhea and blood in stool. EXAM: CT ABDOMEN AND PELVIS WITH CONTRAST TECHNIQUE: Multidetector CT imaging of the abdomen and pelvis was performed using the standard protocol following bolus administration of intravenous contrast. CONTRAST:  180mL ISOVUE-300 IOPAMIDOL (ISOVUE-300) INJECTION 61% COMPARISON:  None. FINDINGS: Lower chest: No acute abnormality.  Moderate-sized hiatal hernia present. Hepatobiliary: No focal liver abnormality is seen. Status post cholecystectomy. No biliary dilatation. Pancreas: Unremarkable. No pancreatic ductal dilatation or surrounding inflammatory changes. Spleen: Normal in size without focal abnormality. Adrenals/Urinary Tract: Adrenal glands are unremarkable. No hydronephrosis. There are some nonobstructing calculi in the right kidney. The largest measures 4 mm. No ureteral or bladder calculi identified. Bladder is unremarkable. Stomach/Bowel: The bowel shows suggestion of some thickening of the cecum and ascending colon with mild surrounding inflammatory changes in the adjacent fat. There also is some suggestion of rectosigmoid thickening. The sigmoid colon demonstrates diverticulosis without evidence of acute diverticulitis. No evidence of bowel obstruction, free intraperitoneal air or focal abscess. Small bowel is unremarkable. Vascular/Lymphatic: The abdominal aorta demonstrates calcified plaque without evidence of aneurysm. No enlarged abdominal or pelvic lymph nodes. Reproductive: Uterus and bilateral adnexa  are unremarkable. Other: No abdominal wall hernia or abnormality. No abdominopelvic ascites. Musculoskeletal: No acute or significant osseous findings. IMPRESSION: 1. Suggestion of colitis involving the cecum and ascending colon. There may also be some inflammation of the rectosigmoid colon. No evidence of perforation or focal abscess. 2. Nephrolithiasis with nonobstructing calculi in the right kidney. Electronically Signed   By: Aletta Edouard M.D.   On: 12/16/2016 17:03    Assessment/Plan Present on Admission: . Colitis  Colitis:  -She has poor oral intake for the last several day, urine + ketone, she is dehydrated, though nontoxic appearing, vital stable -will check cdiff and gi pcr panel, continue cipro/flagyl started in the ED -continue hydration, clear liquid diet for now, advance diet as  tolerated  Hyponatremia:  Sodium 129 on admission,  Likely from dehydration, thought review chart, she has had hyponatremia in 2016 as well,  She has no confusion, no headache Hold home meds HCTZ, Continue hydration, repeat sodium in am  Hypokalemia: k3.4,  replace k, check mag, hold HCTZ.  lft elevation:  From colitis? CTab "Hepatobiliary: No focal liver abnormality is seen. Status post cholecystectomy. No biliary dilatation." Will check hepatitis, repeat lft in am.  HTN:  presented with colitis /dehydration, will hold HCTZ AND LISINopril, continue norvasc.  Resume home meds gradually once colitis improves.  HLD: hold statin for now due to lft elevation, resume when lft improves  H/o TIA: continue asa 325 , statin held for now, likely able to resume soon.  Hypothyroidism: continue home meds synthroid.  Nephrolithiasis with nonobstructing calculi in the right kidney,  incidental finding on the CT , patient does not have symptom, urine no blood, cr wnl.  Depression: continue zoloft, stable  Daily alcohol use: patient report "it is a family tradition", she never had withdrawal problem, last drink yesterday.   DVT prophylaxis:  scd's for now, due to report of blood in stool  Consultants: none  Code Status: full   Family Communication:  Patient   Disposition Plan: admit to med surg  Time spent: 59mins  Jamarea Selner MD, PhD [facility] Pager [phone] If 7PM-7AM, please contact night-coverage at [email or web address], password [facility]

## 2016-12-16 NOTE — ED Notes (Signed)
Pt transported to CT ?

## 2016-12-16 NOTE — ED Provider Notes (Signed)
[facility] DEPT Provider Note   CSN: [id number] Arrival date & time: 12/16/16  1203     History   Chief Complaint Chief Complaint  Patient presents with  . Abdominal Pain  . Diarrhea    HPI Diane Chambers is a 83 y.o. female.  HPI   83 year old female with past medical history as above. Severe abdominal pain. The patient states that for the last week, she has developed progressively worsening, diffuse abdominal pain. Her symptoms started 3 days ago as acute onset of aching, cramping pain in her lower abdomen. This is associated with profuse, initially nonbloody diarrhea. Over the last several days, she has had worsening diarrhea as well as lower abdominal pain. She has had chills as well. She denies any history of similar episodes. She has not recently had a colonoscopy. She has not been able to eat or drink much because it reportedly runs through her and causes immediate diarrhea. The reason she presents today is because this morning, she had a large, bloody bowel movement that was significantly worse than her previous episodes. She subsequent presents for evaluation. Denies any syncope or lightheadedness.  Past Medical History:  Diagnosis Date  . GERD (gastroesophageal reflux disease)   . Hypertension   . TIA (transient ischemic attack)    times 4    Patient Active Problem List   Diagnosis Date Noted  . Colitis 12/16/2016  . Confusion 12/29/2014  . TIA (transient ischemic attack) 12/29/2014  . Essential hypertension 12/29/2014  . Hyponatremia 12/29/2014    Past Surgical History:  Procedure Laterality Date  . CHOLECYSTECTOMY    . FRACTURE SURGERY    . JOINT REPLACEMENT     right knee    OB History    No data available       Home Medications    Prior to Admission medications   Medication Sig Start Date End Date Taking? Authorizing Provider  amLODipine (NORVASC) 5 MG tablet Take 5 mg by mouth daily. 11/30/16  Yes [provider]  aspirin 325 MG  tablet Take 325 mg by mouth daily.   Yes [provider]  atorvastatin (LIPITOR) 20 MG tablet Take 20 mg by mouth daily.   Yes [provider]  Cholecalciferol (VITAMIN D-3 PO) Take 1 capsule by mouth at bedtime.   Yes [provider]  Cod Liver Oil (COD LIVER PO) Take 1 capsule by mouth at bedtime.   Yes [provider]  hydrochlorothiazide (HYDRODIURIL) 25 MG tablet Take 12.5 mg by mouth daily.    Yes [provider]  levothyroxine (SYNTHROID, LEVOTHROID) 137 MCG tablet Take 137 mcg by mouth daily before breakfast. 12/01/16  Yes [provider]  losartan (COZAAR) 50 MG tablet Take 100 mg by mouth every morning. 09/14/16  Yes [provider]  Multiple Vitamins-Minerals (PRESERVISION AREDS 2) CAPS Take 1 capsule by mouth 2 (two) times daily.   Yes [provider]  omeprazole (PRILOSEC) 20 MG capsule Take 20 mg by mouth 2 (two) times daily before a meal.  11/24/16  Yes [provider]  sertraline (ZOLOFT) 50 MG tablet Take 25 mg by mouth at bedtime. 10/28/16  Yes [provider]  traZODone (DESYREL) 50 MG tablet Take 0.5 tablets (25 mg total) by mouth at bedtime as needed for sleep. Patient taking differently: Take 50 mg by mouth at bedtime.  12/30/14  Yes Eulogio Bear U, DO  VITAMIN A PO Take 1 tablet by mouth at bedtime.    Yes [provider]  folic acid (FOLVITE) 1 MG tablet Take 1 tablet (1 mg total) by mouth daily. Patient not taking: Reported on 12/16/2016 12/30/14   Geradine Girt, DO    Family History Family History  Problem Relation Age of Onset  . Diabetes Mother   . Hyperlipidemia Father     Social History Social History  Substance Use Topics  . Smoking status: Former Research scientist (life sciences)  . Smokeless tobacco: Never Used  . Alcohol use Yes     Comment: 1 drink per night     Allergies   Calcium-containing compounds   Review of Systems Review of Systems  Constitutional: Positive for chills and  fatigue. Negative for fever.  HENT: Negative for congestion and rhinorrhea.   Eyes: Negative for visual disturbance.  Respiratory: Negative for cough, shortness of breath and wheezing.   Cardiovascular: Negative for chest pain and leg swelling.  Gastrointestinal: Positive for abdominal pain, blood in stool, diarrhea and nausea.  Genitourinary: Negative for dysuria and flank pain.  Musculoskeletal: Negative for neck pain and neck stiffness.  Skin: Negative for rash and wound.  Allergic/Immunologic: Negative for immunocompromised state.  Neurological: Negative for syncope, weakness and headaches.  All other systems reviewed and are negative.    Physical Exam Updated Vital Signs BP (!) 144/70   Pulse 81   Temp 97.6 F (36.4 C)   Resp 18   Ht 5\' 6"  (1.676 m)   Wt 68.6 kg (151 lb 3.2 oz)   SpO2 96%   BMI 24.40 kg/m   Physical Exam  Constitutional: She is oriented to person, place, and time. She appears well-developed and well-nourished. No distress.  HENT:  Head: Normocephalic and atraumatic.  Eyes: Conjunctivae are normal.  Neck: Neck supple.  Cardiovascular: Normal rate, regular rhythm and normal heart sounds.  Exam reveals no friction rub.   No murmur heard. Pulmonary/Chest: Effort normal and breath sounds normal. No respiratory distress. She has no wheezes. She has no rales.  Abdominal: Soft. Normal appearance. She exhibits no distension. There is tenderness in the right lower quadrant, suprapubic area and left lower quadrant. There is guarding. There is no rigidity and no rebound.  Musculoskeletal: She exhibits no edema.  Neurological: She is alert and oriented to person, place, and time. She exhibits normal muscle tone.  Skin: Skin is warm. Capillary refill takes less than 2 seconds.  Psychiatric: She has a normal mood and affect.  Nursing note and vitals reviewed.    ED Treatments / Results  Labs (all labs ordered are listed, but only abnormal results are  displayed) Labs Reviewed  COMPREHENSIVE METABOLIC PANEL - Abnormal; Notable for the following:       Result Value   Sodium 129 (*)    Potassium 3.4 (*)    Chloride 97 (*)    Glucose, Bld 137 (*)    AST 105 (*)    ALT 208 (*)    Alkaline Phosphatase 212 (*)    All other components within normal limits  CBC - Abnormal; Notable for the following:    WBC 10.6 (*)    All other components within normal limits  URINALYSIS, ROUTINE W REFLEX MICROSCOPIC - Abnormal; Notable for the following:    Color, Urine AMBER (*)    APPearance HAZY (*)    Ketones, ur 5 (*)    Protein, ur 100 (*)    Bacteria, UA RARE (*)    Squamous Epithelial / LPF 0-5 (*)    All other components within normal limits  CBC - Abnormal; Notable for the following:    RBC 3.46 (*)    Hemoglobin 10.3 (*)    HCT 30.7 (*)    All other components within normal limits  COMPREHENSIVE METABOLIC PANEL - Abnormal; Notable for the following:    Sodium 131 (*)    Potassium 3.4 (*)    Glucose, Bld 166 (*)    BUN <5 (*)    Calcium 8.3 (*)    Total Protein 5.1 (*)    Albumin 3.0 (*)    AST 51 (*)    ALT 124 (*)    Alkaline Phosphatase 157 (*)    Anion gap 4 (*)    All other components within normal limits  POC OCCULT BLOOD, ED - Abnormal; Notable for the following:    Fecal Occult Bld POSITIVE (*)    All other components within normal limits  C DIFFICILE QUICK SCREEN W PCR REFLEX  CULTURE, BLOOD (ROUTINE X 2)  CULTURE, BLOOD (ROUTINE X 2)  GASTROINTESTINAL PANEL BY PCR, STOOL (REPLACES STOOL CULTURE)  LIPASE, BLOOD  MAGNESIUM  HEPATITIS PANEL, ACUTE  I-STAT CG4 LACTIC ACID, ED  I-STAT CG4 LACTIC ACID, ED    EKG  EKG Interpretation None       Radiology Ct Abdomen Pelvis W Contrast  Result Date: 12/16/2016 CLINICAL DATA:  Diarrhea and blood in stool. EXAM: CT ABDOMEN AND PELVIS WITH CONTRAST TECHNIQUE: Multidetector CT imaging of the abdomen and pelvis was performed using the standard protocol following bolus  administration of intravenous contrast. CONTRAST:  19mL ISOVUE-300 IOPAMIDOL (ISOVUE-300) INJECTION 61% COMPARISON:  None. FINDINGS: Lower chest: No acute abnormality. Moderate-sized hiatal hernia present. Hepatobiliary: No focal liver abnormality is seen. Status post cholecystectomy. No biliary dilatation. Pancreas: Unremarkable. No pancreatic ductal dilatation or surrounding inflammatory changes. Spleen: Normal in size without focal abnormality. Adrenals/Urinary Tract: Adrenal glands are unremarkable. No hydronephrosis. There are some nonobstructing calculi in the right kidney. The largest measures 4 mm. No ureteral or bladder calculi identified. Bladder is unremarkable. Stomach/Bowel: The bowel shows suggestion of some thickening of the cecum and ascending colon with mild surrounding inflammatory changes in the adjacent fat. There also is some suggestion of rectosigmoid thickening. The sigmoid colon demonstrates diverticulosis without evidence of acute diverticulitis. No evidence of bowel obstruction, free intraperitoneal air or focal abscess. Small bowel is unremarkable. Vascular/Lymphatic: The abdominal aorta demonstrates calcified plaque without evidence of aneurysm. No enlarged abdominal or pelvic lymph nodes. Reproductive: Uterus and bilateral adnexa are unremarkable. Other: No abdominal wall hernia or abnormality. No abdominopelvic ascites. Musculoskeletal: No acute or significant osseous findings. IMPRESSION: 1. Suggestion of colitis involving the cecum and ascending colon. There may also be some inflammation of the rectosigmoid colon. No evidence of perforation or focal abscess. 2. Nephrolithiasis with nonobstructing calculi in the right kidney. Electronically Signed   By: Aletta Edouard M.D.   On: 12/16/2016 17:03    Procedures Procedures (including critical care time)  Medications Ordered in ED Medications  ondansetron (ZOFRAN) injection 4 mg (not administered)  morphine 4 MG/ML injection 2  mg (not administered)  amLODipine (NORVASC) tablet 5 mg (5 mg Oral Given 12/17/16 1005)  levothyroxine (SYNTHROID, LEVOTHROID) tablet 137 mcg (137 mcg Oral Given 12/17/16 1005)  sertraline (ZOLOFT) tablet 25 mg (25 mg Oral Given 12/16/16 2203)  traZODone (DESYREL) tablet 50 mg (50 mg Oral Given 12/16/16 2203)  ciprofloxacin (CIPRO) IVPB 400 mg (0 mg Intravenous Stopped 12/17/16 0604)  0.9 %  sodium chloride infusion ( Intravenous New Bag/Given 12/17/16 0454)  zolpidem (AMBIEN) tablet 5 mg (5 mg Oral Given 12/16/16 2248)  ketorolac (TORADOL) 15 MG/ML injection 15 mg (not administered)  ondansetron (ZOFRAN) injection 4 mg (not administered)  sodium chloride 0.9 % bolus 1,000 mL (1,000 mLs Intravenous New Bag/Given 12/16/16 1735)  iopamidol (ISOVUE-300) 61 % injection (100 mLs  Contrast Given 12/16/16 1643)  ciprofloxacin (CIPRO) IVPB 400 mg (0 mg Intravenous Stopped 12/16/16 1852)  metroNIDAZOLE (FLAGYL) IVPB 500 mg (0 mg Intravenous Stopped 12/16/16 1956)  potassium chloride 10 mEq in 100 mL IVPB (0 mEq Intravenous Stopped 12/17/16 0327)  potassium chloride SA (K-DUR,KLOR-CON) CR tablet 40 mEq (40 mEq Oral Given 12/17/16 6226)     Initial Impression / Assessment and Plan / ED Course  I have reviewed the triage vital signs and the nursing notes.  Pertinent labs & imaging results that were available during my care of the patient were reviewed by me and considered in my medical decision making (see chart for details).     83 yo F here with lower abdominal pain, blood-tinged diarrhea. Exam as above. Lab work shows leukocytosis, acute hyponatremia, and CT is c/f diffuse colitis. Suspect acute, infectious colitis with subsequent dehydration and hypovolemic hyponatremia. No evidence of severe sepsis. Pt has not recently been on ABX, less likely C. Diff. Will start Cipro/Flagyl, admit for hydration and sx management.   Final Clinical Impressions(s) / ED Diagnoses   Final diagnoses:  Acute colitis    Dehydration  Hyponatremia    New Prescriptions Current Discharge Medication List       Duffy Bruce, MD 12/17/16 1251

## 2016-12-17 DIAGNOSIS — A09 Infectious gastroenteritis and colitis, unspecified: Secondary | ICD-10-CM

## 2016-12-17 DIAGNOSIS — D62 Acute posthemorrhagic anemia: Secondary | ICD-10-CM

## 2016-12-17 DIAGNOSIS — R740 Nonspecific elevation of levels of transaminase and lactic acid dehydrogenase [LDH]: Secondary | ICD-10-CM

## 2016-12-17 LAB — COMPREHENSIVE METABOLIC PANEL
ALBUMIN: 3 g/dL — AB (ref 3.5–5.0)
ALT: 124 U/L — ABNORMAL HIGH (ref 14–54)
ANION GAP: 4 — AB (ref 5–15)
AST: 51 U/L — ABNORMAL HIGH (ref 15–41)
Alkaline Phosphatase: 157 U/L — ABNORMAL HIGH (ref 38–126)
BILIRUBIN TOTAL: 0.3 mg/dL (ref 0.3–1.2)
BUN: <5 mg/dL — ABNORMAL LOW (ref 6–20)
CO2: 24 mmol/L (ref 22–32)
Calcium: 8.3 mg/dL — ABNORMAL LOW (ref 8.9–10.3)
Chloride: 103 mmol/L (ref 101–111)
Creatinine, Ser: 0.61 mg/dL (ref 0.44–1.00)
GFR calc non Af Amer: >60 mL/min (ref >60)
GLUCOSE: 166 mg/dL — AB (ref 65–99)
POTASSIUM: 3.4 mmol/L — AB (ref 3.5–5.1)
SODIUM: 131 mmol/L — AB (ref 135–145)
TOTAL PROTEIN: 5.1 g/dL — AB (ref 6.5–8.1)

## 2016-12-17 LAB — CBC
HEMATOCRIT: 30.7 % — AB (ref 36.0–46.0)
Hemoglobin: 10.3 g/dL — ABNORMAL LOW (ref 12.0–15.0)
MCH: 29.8 pg (ref 26.0–34.0)
MCHC: 33.6 g/dL (ref 30.0–36.0)
MCV: 88.7 fL (ref 78.0–100.0)
Platelets: 251 10*3/uL (ref 150–400)
RBC: 3.46 MIL/uL — ABNORMAL LOW (ref 3.87–5.11)
RDW: 14.4 % (ref 11.5–15.5)
WBC: 6.7 10*3/uL (ref 4.0–10.5)

## 2016-12-17 LAB — GASTROINTESTINAL PANEL BY PCR, STOOL (REPLACES STOOL CULTURE)
ASTROVIRUS: NOT DETECTED
Adenovirus F40/41: NOT DETECTED
CRYPTOSPORIDIUM: NOT DETECTED
CYCLOSPORA CAYETANENSIS: NOT DETECTED
Campylobacter species: DETECTED — AB
ENTEROTOXIGENIC E COLI (ETEC): NOT DETECTED
Entamoeba histolytica: NOT DETECTED
Enteroaggregative E coli (EAEC): NOT DETECTED
Enteropathogenic E coli (EPEC): NOT DETECTED
Giardia lamblia: NOT DETECTED
Norovirus GI/GII: NOT DETECTED
PLESIMONAS SHIGELLOIDES: NOT DETECTED
Rotavirus A: NOT DETECTED
SAPOVIRUS (I, II, IV, AND V): NOT DETECTED
SHIGA LIKE TOXIN PRODUCING E COLI (STEC): NOT DETECTED
SHIGELLA/ENTEROINVASIVE E COLI (EIEC): NOT DETECTED
Salmonella species: NOT DETECTED
VIBRIO SPECIES: NOT DETECTED
Vibrio cholerae: NOT DETECTED
YERSINIA ENTEROCOLITICA: NOT DETECTED

## 2016-12-17 LAB — MAGNESIUM: MAGNESIUM: 1.8 mg/dL (ref 1.7–2.4)

## 2016-12-17 MED ORDER — DEXTROSE 5 % IV SOLN
500.0000 mg | INTRAVENOUS | Status: DC
  Administered 2016-12-17 – 2016-12-18 (×2): 500 mg via INTRAVENOUS
  Filled 2016-12-17 (×2): qty 500

## 2016-12-17 MED ORDER — KETOROLAC TROMETHAMINE 15 MG/ML IJ SOLN
15.0000 mg | Freq: Four times a day (QID) | INTRAMUSCULAR | Status: AC | PRN
  Filled 2016-12-17: qty 1

## 2016-12-17 MED ORDER — POTASSIUM CHLORIDE CRYS ER 20 MEQ PO TBCR
40.0000 meq | EXTENDED_RELEASE_TABLET | Freq: Once | ORAL | Status: AC
  Administered 2016-12-17: 40 meq via ORAL
  Filled 2016-12-17: qty 2

## 2016-12-17 MED ORDER — ASPIRIN 325 MG PO TABS: 325.0000 mg | ORAL_TABLET | Freq: Every day | ORAL | Status: DC

## 2016-12-17 MED ORDER — ONDANSETRON HCL 4 MG/2ML IJ SOLN
4.0000 mg | Freq: Four times a day (QID) | INTRAMUSCULAR | Status: DC | PRN
  Administered 2016-12-18: 4 mg via INTRAVENOUS
  Filled 2016-12-17 (×2): qty 2

## 2016-12-17 NOTE — Progress Notes (Addendum)
PROGRESS NOTE                                                                                                                                                                                                             Patient Demographics:    Diane Chambers, is a 83 y.o. female, DOB - February 08, 1934, LEX:[id number]  Admit date - 12/16/2016   Admitting Physician Florencia Reasons, MD  Outpatient Primary MD for the patient is Antony Blackbird, MD  LOS - 1  Outpatient Specialists:None  Chief Complaint  Patient presents with  . Abdominal Pain  . Diarrhea       Brief Narrative   83 year old female with history of hypertension and TIAs presented with 2 days history of diffuse abdominal cramping with diarrhea mixed with blood and mucus and subjective chills. No sick contact or recent travel. No recent antibiotic use. In the ED she had a fever of 100F, tachycardic to low 100s, WBC of 10.6, mild hyponatremia and hypokalemia, mild transaminitis and normal lipase. CT of the abdomen and pelvis showed colitis involving cecum and ascending colon and possible inflammation of the rectosigmoid colon. No abscess or perforation. Patient given 1 L normal saline bolus followed by IV Cipro and Flagyl and admitted to hospitalist service.   Subjective:   Abdominal pain better. Had multiple diarrhea mixed with blood overnight.   Assessment  & Plan :   Principal problem Acute colitis Suspect this is likely infectious. Continue empiric Cipro and Flagyl. Stool for C. difficile was negative. Pending GI pathogen panel. Stool for Hemoccult in the ED was positive. Monitor H&H. If symptoms persistent despite supportive care and antibiotics would consult GI.  Hyponatremia Secondary to dehydration and GI loss. Continue IV fluids.  Hypokalemia Secondary to GI loss. Being replenished  Acute blood loss anemia Noted for 2 g drop in hemoglobin. Monitor H&H  closely.  History of TIAs Hold aspirin in the setting of active bleeding. Statin held due to transaminitis.  Transaminitis No clear etiology. Hold statin. Pending hepatitis panel. CT without any liver abnormality. Patient has had cholecystectomy. Some improvement in a.m. lab  Hypothyroidism Continue Synthroid  Code Status : Full code  Family Communication  : None at bedside  Disposition Plan  : Home possibly in 48 hours if improving  Barriers For Discharge : Active symptoms  Consults  :  None  Procedures  : CT abdomen and pelvis  DVT Prophylaxis  :  Lovenox -   Lab Results  Component Value Date   PLT 251 12/17/2016    Antibiotics  :    Anti-infectives    Start     Dose/Rate Route Frequency Ordered Stop   12/17/16 0600  ciprofloxacin (CIPRO) IVPB 400 mg     400 mg 200 mL/hr over 60 Minutes Intravenous Every 12 hours 12/16/16 2004     12/16/16 1730  ciprofloxacin (CIPRO) IVPB 400 mg     400 mg 200 mL/hr over 60 Minutes Intravenous  Once 12/16/16 1722 12/16/16 1852   12/16/16 1730  metroNIDAZOLE (FLAGYL) IVPB 500 mg     500 mg 100 mL/hr over 60 Minutes Intravenous  Once 12/16/16 1722 12/16/16 1956        Objective:   Vitals:   12/16/16 1800 12/16/16 2000 12/16/16 2100 12/17/16 0635  BP: (!) 142/79 133/72  (!) 144/70  Pulse: 93 91  81  Resp:  18  18  Temp:  98.4 F (36.9 C)  97.6 F (36.4 C)  TempSrc:  Oral    SpO2: 99% 100%  96%  Weight:   68.6 kg (151 lb 3.2 oz)   Height:   5\' 6"  (1.676 m)     Wt Readings from Last 3 Encounters:  12/16/16 68.6 kg (151 lb 3.2 oz)  12/29/14 62.6 kg (138 lb)     Intake/Output Summary (Last 24 hours) at 12/17/16 1113 Last data filed at 12/17/16 0636  Gross per 24 hour  Intake           948.34 ml  Output             1002 ml  Net           -53.66 ml     Physical Exam  Gen: not in distress HEENT: no pallor, moist mucosa, supple neck Chest: clear b/l, no added sounds CVS: N S1&S2, no murmurs,  GI: soft, NT, ND,  BS+ Musculoskeletal: warm, no edema     Data Review:    CBC  Recent Labs Lab 12/16/16 1229 12/17/16 0557  WBC 10.6* 6.7  HGB 12.4 10.3*  HCT 36.2 30.7*  PLT 309 251  MCV 87.7 88.7  MCH 30.0 29.8  MCHC 34.3 33.6  RDW 14.2 14.4    Chemistries   Recent Labs Lab 12/16/16 1229 12/17/16 0557  NA 129* 131*  K 3.4* 3.4*  CL 97* 103  CO2 22 24  GLUCOSE 137* 166*  BUN 6 <5*  CREATININE 0.80 0.61  CALCIUM 9.1 8.3*  MG  --  1.8  AST 105* 51*  ALT 208* 124*  ALKPHOS 212* 157*  BILITOT 0.6 0.3   ------------------------------------------------------------------------------------------------------------------ No results for input(s): CHOL, HDL, LDLCALC, TRIG, CHOLHDL, LDLDIRECT in the last 72 hours.  Lab Results  Component Value Date   HGBA1C 5.7 (H) 12/30/2014   ------------------------------------------------------------------------------------------------------------------ No results for input(s): TSH, T4TOTAL, T3FREE, THYROIDAB in the last 72 hours.  Invalid input(s): FREET3 ------------------------------------------------------------------------------------------------------------------ No results for input(s): VITAMINB12, FOLATE, FERRITIN, TIBC, IRON, RETICCTPCT in the last 72 hours.  Coagulation profile No results for input(s): INR, PROTIME in the last 168 hours.  No results for input(s): DDIMER in the last 72 hours.  Cardiac Enzymes No results for input(s): CKMB, TROPONINI, MYOGLOBIN in the last 168 hours.  Invalid input(s): CK ------------------------------------------------------------------------------------------------------------------ No results found for: BNP  Inpatient Medications  Scheduled Meds: . amLODipine  5 mg Oral Daily  .  aspirin  325 mg Oral QHS  . levothyroxine  137 mcg Oral QAC breakfast  .  morphine injection  2 mg Intravenous Once  . ondansetron (ZOFRAN) IV  4 mg Intravenous Once  . sertraline  25 mg Oral QHS  . traZODone   50 mg Oral QHS   Continuous Infusions: . sodium chloride 100 mL/hr at 12/17/16 0454  . ciprofloxacin Stopped (12/17/16 0604)   PRN Meds:.ondansetron (ZOFRAN) IV, zolpidem  Micro Results Recent Results (from the past 240 hour(s))  C difficile quick scan w PCR reflex     Status: None   Collection Time: 12/16/16  6:16 PM  Result Value Ref Range Status   C Diff antigen NEGATIVE NEGATIVE Final   C Diff toxin NEGATIVE NEGATIVE Final   C Diff interpretation No C. difficile detected.  Final    Radiology Reports Ct Abdomen Pelvis W Contrast  Result Date: 12/16/2016 CLINICAL DATA:  Diarrhea and blood in stool. EXAM: CT ABDOMEN AND PELVIS WITH CONTRAST TECHNIQUE: Multidetector CT imaging of the abdomen and pelvis was performed using the standard protocol following bolus administration of intravenous contrast. CONTRAST:  15mL ISOVUE-300 IOPAMIDOL (ISOVUE-300) INJECTION 61% COMPARISON:  None. FINDINGS: Lower chest: No acute abnormality. Moderate-sized hiatal hernia present. Hepatobiliary: No focal liver abnormality is seen. Status post cholecystectomy. No biliary dilatation. Pancreas: Unremarkable. No pancreatic ductal dilatation or surrounding inflammatory changes. Spleen: Normal in size without focal abnormality. Adrenals/Urinary Tract: Adrenal glands are unremarkable. No hydronephrosis. There are some nonobstructing calculi in the right kidney. The largest measures 4 mm. No ureteral or bladder calculi identified. Bladder is unremarkable. Stomach/Bowel: The bowel shows suggestion of some thickening of the cecum and ascending colon with mild surrounding inflammatory changes in the adjacent fat. There also is some suggestion of rectosigmoid thickening. The sigmoid colon demonstrates diverticulosis without evidence of acute diverticulitis. No evidence of bowel obstruction, free intraperitoneal air or focal abscess. Small bowel is unremarkable. Vascular/Lymphatic: The abdominal aorta demonstrates calcified  plaque without evidence of aneurysm. No enlarged abdominal or pelvic lymph nodes. Reproductive: Uterus and bilateral adnexa are unremarkable. Other: No abdominal wall hernia or abnormality. No abdominopelvic ascites. Musculoskeletal: No acute or significant osseous findings. IMPRESSION: 1. Suggestion of colitis involving the cecum and ascending colon. There may also be some inflammation of the rectosigmoid colon. No evidence of perforation or focal abscess. 2. Nephrolithiasis with nonobstructing calculi in the right kidney. Electronically Signed   By: Aletta Edouard M.D.   On: 12/16/2016 17:03    Time Spent in minutes  25   Louellen Molder M.D on 12/17/2016 at 11:13 AM  Between 7am to 7pm - Pager - [phone]  After 7pm go to [email or web address] - password [facility]  [facility] -  Office  [phone]

## 2016-12-17 NOTE — Progress Notes (Signed)
GI pathogen panel positive for campylobacter species. Will switch cipro to IV azithromycin 500 mg q 24 hrs for 3 days. continue flagyl.

## 2016-12-17 NOTE — Progress Notes (Signed)
GI pathogen PCR positive for Campylobacter, MD notified

## 2016-12-18 DIAGNOSIS — A045 Campylobacter enteritis: Principal | ICD-10-CM

## 2016-12-18 LAB — CBC
HCT: 31.1 % — ABNORMAL LOW (ref 36.0–46.0)
Hemoglobin: 10.4 g/dL — ABNORMAL LOW (ref 12.0–15.0)
MCH: 29.6 pg (ref 26.0–34.0)
MCHC: 33.4 g/dL (ref 30.0–36.0)
MCV: 88.6 fL (ref 78.0–100.0)
Platelets: 277 10*3/uL (ref 150–400)
RBC: 3.51 MIL/uL — AB (ref 3.87–5.11)
RDW: 14.7 % (ref 11.5–15.5)
WBC: 4.9 10*3/uL (ref 4.0–10.5)

## 2016-12-18 LAB — COMPREHENSIVE METABOLIC PANEL
ALK PHOS: 136 U/L — AB (ref 38–126)
ALT: 95 U/L — ABNORMAL HIGH (ref 14–54)
ANION GAP: 5 (ref 5–15)
AST: 32 U/L (ref 15–41)
Albumin: 3 g/dL — ABNORMAL LOW (ref 3.5–5.0)
BILIRUBIN TOTAL: 0.3 mg/dL (ref 0.3–1.2)
BUN: <5 mg/dL — ABNORMAL LOW (ref 6–20)
CALCIUM: 8.6 mg/dL — AB (ref 8.9–10.3)
CO2: 24 mmol/L (ref 22–32)
Chloride: 107 mmol/L (ref 101–111)
Creatinine, Ser: 0.52 mg/dL (ref 0.44–1.00)
GFR calc non Af Amer: >60 mL/min (ref >60)
Glucose, Bld: 101 mg/dL — ABNORMAL HIGH (ref 65–99)
Potassium: 3.4 mmol/L — ABNORMAL LOW (ref 3.5–5.1)
Sodium: 136 mmol/L (ref 135–145)
TOTAL PROTEIN: 5.3 g/dL — AB (ref 6.5–8.1)

## 2016-12-18 LAB — MAGNESIUM: Magnesium: 1.7 mg/dL (ref 1.7–2.4)

## 2016-12-18 MED ORDER — POTASSIUM CHLORIDE CRYS ER 20 MEQ PO TBCR
40.0000 meq | EXTENDED_RELEASE_TABLET | Freq: Once | ORAL | Status: AC
  Administered 2016-12-18: 40 meq via ORAL
  Filled 2016-12-18: qty 2

## 2016-12-18 MED ORDER — ASPIRIN 325 MG PO TABS
325.0000 mg | ORAL_TABLET | Freq: Every day | ORAL | Status: DC
  Administered 2016-12-18 – 2016-12-19 (×2): 325 mg via ORAL
  Filled 2016-12-18 (×2): qty 1

## 2016-12-18 NOTE — Progress Notes (Signed)
[facility] PROGRESS NOTE  Diane Chambers BWI:[id number] DOB: 10/14/1933 DOA: 12/16/2016  PCP: Antony Blackbird, MD  Brief History/Interval Summary: 83 year old Caucasian female with a past medical history of TIA on aspirin, hypertension, presented with 2 day history of diarrhea with abdominal cramps. She had blood and mucus in her stools. She had a fever. She underwent CT scan of her abdomen and pelvis which showed colitis. Patient was hospitalized for further management.  Reason for Visit: Colitis due to Campylobacter  Consultants: None  Procedures: None  Antibiotics: Initially treated with intravenous Cipro and Flagyl Now on azithromycin  Subjective/Interval History: Patient continues to have loose stools, although the amount of stool has reduced. Has had 10 BMs since yesterday morning. The bleeding also appears to be subsiding. Continues to have some abdominal cramps. Denies any nausea or vomiting. Requesting that we resume her aspirin.  ROS: No chest pain or shortness of breath.  Objective:  Vital Signs  Vitals:   12/17/16 0635 12/17/16 1424 12/17/16 2158 12/18/16 0552  BP: (!) 144/70 124/67 138/63 (!) 145/69  Pulse: 81 82 82 79  Resp: 18 18 18 18   Temp: 97.6 F (36.4 C) 97.6 F (36.4 C) 98.3 F (36.8 C) 98.1 F (36.7 C)  TempSrc:  Oral Oral Oral  SpO2: 96% 100% 97% 98%  Weight:      Height:        Intake/Output Summary (Last 24 hours) at 12/18/16 1241 Last data filed at 12/18/16 6384  Gross per 24 hour  Intake          3683.33 ml  Output              350 ml  Net          3333.33 ml   Filed Weights   12/16/16 2100  Weight: 68.6 kg (151 lb 3.2 oz)    General appearance: alert, cooperative, appears stated age and no distress Head: Normocephalic, without obvious abnormality, atraumatic Resp: clear to auscultation bilaterally Cardio: regular rate and rhythm, S1, S2 normal, no murmur, click, rub or gallop GI: Abdomen is soft. Tender in the lower quadrants  without any rebound, rigidity or guarding. No masses or organomegaly. Bowel sounds present. Extremities: extremities normal, atraumatic, no cyanosis or edema Neurologic: No focal deficits  Lab Results:  Data Reviewed: I have personally reviewed following labs and imaging studies  CBC:  Recent Labs Lab 12/16/16 1229 12/17/16 0557 12/18/16 0533  WBC 10.6* 6.7 4.9  HGB 12.4 10.3* 10.4*  HCT 36.2 30.7* 31.1*  MCV 87.7 88.7 88.6  PLT 309 251 536    Basic Metabolic Panel:  Recent Labs Lab 12/16/16 1229 12/17/16 0557 12/18/16 0533 12/18/16 0922  NA 129* 131* 136  --   K 3.4* 3.4* 3.4*  --   CL 97* 103 107  --   CO2 22 24 24   --   GLUCOSE 137* 166* 101*  --   BUN 6 <5* <5*  --   CREATININE 0.80 0.61 0.52  --   CALCIUM 9.1 8.3* 8.6*  --   MG  --  1.8  --  1.7    GFR: Estimated Creatinine Clearance: 52.5 mL/min (by C-G formula based on SCr of 0.52 mg/dL).  Liver Function Tests:  Recent Labs Lab 12/16/16 1229 12/17/16 0557 12/18/16 0533  AST 105* 51* 32  ALT 208* 124* 95*  ALKPHOS 212* 157* 136*  BILITOT 0.6 0.3 0.3  PROT 6.5 5.1* 5.3*  ALBUMIN 3.8 3.0* 3.0*  Recent Labs Lab 12/16/16 1229  LIPASE 29     Recent Results (from the past 240 hour(s))  C difficile quick scan w PCR reflex     Status: None   Collection Time: 12/16/16  6:16 PM  Result Value Ref Range Status   C Diff antigen NEGATIVE NEGATIVE Final   C Diff toxin NEGATIVE NEGATIVE Final   C Diff interpretation No C. difficile detected.  Final  Gastrointestinal Panel by PCR , Stool     Status: Abnormal   Collection Time: 12/16/16  6:16 PM  Result Value Ref Range Status   Campylobacter species DETECTED (A) NOT DETECTED Final    Comment: RESULT CALLED TO, READ BACK BY AND VERIFIED WITH: JOANN HAMCE 12/17/16 AT 1431 BY HS    Plesimonas shigelloides NOT DETECTED NOT DETECTED Final   Salmonella species NOT DETECTED NOT DETECTED Final   Yersinia enterocolitica NOT DETECTED NOT DETECTED Final     Vibrio species NOT DETECTED NOT DETECTED Final   Vibrio cholerae NOT DETECTED NOT DETECTED Final   Enteroaggregative E coli (EAEC) NOT DETECTED NOT DETECTED Final   Enteropathogenic E coli (EPEC) NOT DETECTED NOT DETECTED Final   Enterotoxigenic E coli (ETEC) NOT DETECTED NOT DETECTED Final   Shiga like toxin producing E coli (STEC) NOT DETECTED NOT DETECTED Final   Shigella/Enteroinvasive E coli (EIEC) NOT DETECTED NOT DETECTED Final   Cryptosporidium NOT DETECTED NOT DETECTED Final   Cyclospora cayetanensis NOT DETECTED NOT DETECTED Final   Entamoeba histolytica NOT DETECTED NOT DETECTED Final   Giardia lamblia NOT DETECTED NOT DETECTED Final   Adenovirus F40/41 NOT DETECTED NOT DETECTED Final   Astrovirus NOT DETECTED NOT DETECTED Final   Norovirus GI/GII NOT DETECTED NOT DETECTED Final   Rotavirus A NOT DETECTED NOT DETECTED Final   Sapovirus (I, II, IV, and V) NOT DETECTED NOT DETECTED Final  Blood culture (routine x 2)     Status: None (Preliminary result)   Collection Time: 12/16/16  6:38 PM  Result Value Ref Range Status   Specimen Description BLOOD LEFT FOREARM  Final   Special Requests IN PEDIATRIC BOTTLE Blood Culture adequate volume  Final   Culture NO GROWTH < 24 HOURS  Final   Report Status PENDING  Incomplete  Blood culture (routine x 2)     Status: None (Preliminary result)   Collection Time: 12/16/16  6:43 PM  Result Value Ref Range Status   Specimen Description BLOOD LEFT ANTECUBITAL  Final   Special Requests   Final    BOTTLES DRAWN AEROBIC AND ANAEROBIC Blood Culture adequate volume   Culture NO GROWTH < 24 HOURS  Final   Report Status PENDING  Incomplete      Radiology Studies: Ct Abdomen Pelvis W Contrast  Result Date: 12/16/2016 CLINICAL DATA:  Diarrhea and blood in stool. EXAM: CT ABDOMEN AND PELVIS WITH CONTRAST TECHNIQUE: Multidetector CT imaging of the abdomen and pelvis was performed using the standard protocol following bolus administration of  intravenous contrast. CONTRAST:  151mL ISOVUE-300 IOPAMIDOL (ISOVUE-300) INJECTION 61% COMPARISON:  None. FINDINGS: Lower chest: No acute abnormality. Moderate-sized hiatal hernia present. Hepatobiliary: No focal liver abnormality is seen. Status post cholecystectomy. No biliary dilatation. Pancreas: Unremarkable. No pancreatic ductal dilatation or surrounding inflammatory changes. Spleen: Normal in size without focal abnormality. Adrenals/Urinary Tract: Adrenal glands are unremarkable. No hydronephrosis. There are some nonobstructing calculi in the right kidney. The largest measures 4 mm. No ureteral or bladder calculi identified. Bladder is unremarkable. Stomach/Bowel: The bowel shows suggestion of  some thickening of the cecum and ascending colon with mild surrounding inflammatory changes in the adjacent fat. There also is some suggestion of rectosigmoid thickening. The sigmoid colon demonstrates diverticulosis without evidence of acute diverticulitis. No evidence of bowel obstruction, free intraperitoneal air or focal abscess. Small bowel is unremarkable. Vascular/Lymphatic: The abdominal aorta demonstrates calcified plaque without evidence of aneurysm. No enlarged abdominal or pelvic lymph nodes. Reproductive: Uterus and bilateral adnexa are unremarkable. Other: No abdominal wall hernia or abnormality. No abdominopelvic ascites. Musculoskeletal: No acute or significant osseous findings. IMPRESSION: 1. Suggestion of colitis involving the cecum and ascending colon. There may also be some inflammation of the rectosigmoid colon. No evidence of perforation or focal abscess. 2. Nephrolithiasis with nonobstructing calculi in the right kidney. Electronically Signed   By: Aletta Edouard M.D.   On: 12/16/2016 17:03     Medications:  Scheduled: . amLODipine  5 mg Oral Daily  . aspirin  325 mg Oral Daily  . levothyroxine  137 mcg Oral QAC breakfast  .  morphine injection  2 mg Intravenous Once  . ondansetron  (ZOFRAN) IV  4 mg Intravenous Once  . sertraline  25 mg Oral QHS  . traZODone  50 mg Oral QHS   Continuous: . sodium chloride 75 mL/hr at 12/18/16 0957  . azithromycin 500 mg (12/17/16 1735)   YTK:PTWSFKCLEXN (ZOFRAN) IV, zolpidem  Assessment/Plan:  Active Problems:   Colitis    Acute colitis secondary to Campylobacter. C. difficile was negative. Patient was initially placed on intravenous Cipro and Flagyl. Now on oral azithromycin, which is the recommended treatment for Campylobacter. Patient's symptoms seems to be slowly subsiding. Continues to have frequent stool, although the amount of stool is reduced. Hemoglobin is stable.  Acute blood loss anemia. Some of the drop in hemoglobin is likely dilutional. She was having bloody stools as well. Continue to monitor for now.  Hyponatremia and hypokalemia. Low-sodium levels secondary to hypovolemia. Sodium has improved with the hydration. Potassium level remains low and will be supplemented. Magnesium level normal.  History of TIA. Stable. Patient requesting that her aspirin be resumed. She was told about the risks of bleeding associated with aspirin. She is very concerned due to her history of TIAs and would not like to be off of aspirin for prolonged period. However, her bleeding appears to be subsiding. It may be reasonable to resume this from later today. Statin is on hold due to elevated LFTs.  Transaminitis. Etiology unclear but possibly related to her acute illness. Statin is on hold. LFTs are improving. CT scan did not show any liver abnormalities. She is status post cholecystectomy. Hepatitis panel is pending.  History of hypothyroidism. Continue with levothyroxine.  History of essential hypertension. Blood pressure stable. Continue to monitor closely.  Incidental finding of nephrolithiasis. Detected on CT scan. Nonobstructing stones noted. Will need outpatient follow-up.   DVT Prophylaxis: SCDs    Code Status: Full  code  Family Communication: Discussed in detail with patient.  Disposition Plan: Continue to mobilize. Continue treatment as outlined above. Wait for diarrhea to subside to 4 or less BMs per day.    LOS: 2 days   [facility] Pager [phone] 12/18/2016, 12:41 PM  If 7PM-7AM, please contact night-coverage at [email or web address], password [facility]

## 2016-12-19 LAB — CBC
HEMATOCRIT: 30.7 % — AB (ref 36.0–46.0)
HEMOGLOBIN: 10.2 g/dL — AB (ref 12.0–15.0)
MCH: 30.1 pg (ref 26.0–34.0)
MCHC: 33.2 g/dL (ref 30.0–36.0)
MCV: 90.6 fL (ref 78.0–100.0)
Platelets: 278 10*3/uL (ref 150–400)
RBC: 3.39 MIL/uL — AB (ref 3.87–5.11)
RDW: 14.9 % (ref 11.5–15.5)
WBC: 5.1 10*3/uL (ref 4.0–10.5)

## 2016-12-19 LAB — COMPREHENSIVE METABOLIC PANEL
ALBUMIN: 3.1 g/dL — AB (ref 3.5–5.0)
ALK PHOS: 123 U/L (ref 38–126)
ALT: 76 U/L — AB (ref 14–54)
AST: 25 U/L (ref 15–41)
Anion gap: 5 (ref 5–15)
BILIRUBIN TOTAL: 0.6 mg/dL (ref 0.3–1.2)
CALCIUM: 8.7 mg/dL — AB (ref 8.9–10.3)
CO2: 27 mmol/L (ref 22–32)
CREATININE: 0.64 mg/dL (ref 0.44–1.00)
Chloride: 106 mmol/L (ref 101–111)
GFR calc Af Amer: >60 mL/min (ref >60)
GLUCOSE: 98 mg/dL (ref 65–99)
POTASSIUM: 3.5 mmol/L (ref 3.5–5.1)
Sodium: 138 mmol/L (ref 135–145)
Total Protein: 5.2 g/dL — ABNORMAL LOW (ref 6.5–8.1)

## 2016-12-19 LAB — MAGNESIUM: MAGNESIUM: 1.9 mg/dL (ref 1.7–2.4)

## 2016-12-19 MED ORDER — ACETAMINOPHEN 325 MG PO TABS: 650.0000 mg | ORAL_TABLET | Freq: Four times a day (QID) | ORAL | Status: DC | PRN

## 2016-12-19 MED ORDER — AZITHROMYCIN 500 MG PO TABS: 500.0000 mg | ORAL_TABLET | Freq: Every day | ORAL | 0 refills | Status: AC

## 2016-12-19 MED ORDER — AZITHROMYCIN 250 MG PO TABS
500.0000 mg | ORAL_TABLET | Freq: Every day | ORAL | Status: DC
  Administered 2016-12-19: 500 mg via ORAL
  Filled 2016-12-19: qty 2

## 2016-12-19 MED ORDER — POTASSIUM CHLORIDE CRYS ER 20 MEQ PO TBCR
40.0000 meq | EXTENDED_RELEASE_TABLET | Freq: Once | ORAL | Status: AC
  Administered 2016-12-19: 40 meq via ORAL
  Filled 2016-12-19: qty 2

## 2016-12-19 NOTE — Discharge Instructions (Signed)
Colitis Colitis is inflammation of the colon. Colitis may last a short time (acute) or it may last a long time (chronic). What are the causes? This condition may be caused by:  Viruses.  Bacteria.  Reactions to medicine.  Certain autoimmune diseases, such as Crohn disease or ulcerative colitis.  What are the signs or symptoms? Symptoms of this condition include:  Diarrhea.  Passing bloody or tarry stool.  Pain.  Fever.  Vomiting.  Tiredness (fatigue).  Weight loss.  Bloating.  Sudden increase in abdominal pain.  Having fewer bowel movements than usual.  How is this diagnosed? This condition is diagnosed with a stool test or a blood test. You may also have other tests, including X-rays, a CT scan, or a colonoscopy. How is this treated? Treatment may include:  Resting the bowel. This involves not eating or drinking for a period of time.  Fluids that are given through an IV tube.  Medicine for pain and diarrhea.  Antibiotic medicines.  Cortisone medicines.  Surgery.  Follow these instructions at home: Eating and drinking  Follow instructions from your health care provider about eating or drinking restrictions.  Drink enough fluid to keep your urine clear or pale yellow.  Work with a dietitian to determine which foods cause your condition to flare up.  Avoid foods that cause flare-ups.  Eat a well-balanced diet. Medicines  Take over-the-counter and prescription medicines only as told by your health care provider.  If you were prescribed an antibiotic medicine, take it as told by your health care provider. Do not stop taking the antibiotic even if you start to feel better. General instructions  Keep all follow-up visits as told by your health care provider. This is important. Contact a health care provider if:  Your symptoms do not go away.  You develop new symptoms. Get help right away if:  You have a fever that does not go away with  treatment.  You develop chills.  You have extreme weakness, fainting, or dehydration.  You have repeated vomiting.  You develop severe pain in your abdomen.  You pass bloody or tarry stool. This information is not intended to replace advice given to you by your health care provider. Make sure you discuss any questions you have with your health care provider. Document Released: 06/16/2004 Document Revised: 10/15/2015 Document Reviewed: 09/01/2014 Elsevier Interactive Patient Education  2018 [employer]  

## 2016-12-19 NOTE — Consult Note (Signed)
           [facility] CM Primary Care Navigator  12/19/2016  JANKI DIKE Oct 19, 1933 [id number]   Wentto seepatient at the bedsideto identify possible discharge needs but she was just discharged  per RN report. Patient was discharged home today.  Primary care provider's officedoes transition of care.   For questions, please contact:  Dannielle Huh, BSN, RN- [facility] Primary Care Navigator  Telephone: [phone] [place]

## 2016-12-19 NOTE — Discharge Summary (Signed)
[facility]  Physician Discharge Summary   Patient ID: Diane Chambers MRN: [id number] DOB/AGE: [birth year] 83 y.o.  Admit date: 12/16/2016 Discharge date: 12/19/2016  PCP: Antony Blackbird, MD  DISCHARGE DIAGNOSES:  Active Problems:   Colitis   RECOMMENDATIONS FOR OUTPATIENT FOLLOW UP: 1. Acute viral hepatitis panel is pending 2. Incidental nephrolithiasis noted on CT scan 3. Consider repeating CBC and electrolytes with liver function panel in 1 week   DISCHARGE CONDITION: fair  Diet recommendation: Soft diet for 2 days and then as before  [facility] Weights   12/16/16 2100  Weight: 68.6 kg (151 lb 3.2 oz)    INITIAL HISTORY: 83 year old Caucasian female with a past medical history of TIA on aspirin, hypertension, presented with 2 day history of diarrhea with abdominal cramps. She had blood and mucus in her stools. She had a fever. She underwent CT scan of her abdomen and pelvis which showed colitis. Patient was hospitalized for further management.   HOSPITAL COURSE:   Acute colitis secondary to Campylobacter. C. difficile was negative. Patient was initially placed on intravenous Cipro and Flagyl. Now on oral azithromycin, which is the recommended treatment for Campylobacter. Patient's symptoms were slow to improve. Diarrhea has subsided. Still with abdominal cramps, especially after she eats. She has been ambulating. No nausea or vomiting. After she ate her breakfast and lunch today, patient felt much better. Expressed a wish to go home. Stable for discharge.   Acute blood loss anemia. Some of the drop in hemoglobin is likely dilutional. She was having bloody stools as well. Hemoglobin has been stable. She did not require blood transfusion.  Hyponatremia and hypokalemia. Low-sodium levels secondary to hypovolemia. Sodium has improved with the hydration. Potassium level also improved. Magnesium was normal.   History of TIA. Stable. Continue with her home medications  including aspirin  Transaminitis. Etiology unclear but possibly related to her acute illness. Statin was placed on hold. LFTs have improved. She may resume her statin. CT scan did not show any liver abnormalities. She is status post cholecystectomy. Hepatitis panel is pending.  History of hypothyroidism. Continue with levothyroxine.  History of essential hypertension. Continue home medications   Incidental finding of nephrolithiasis. Detected on CT scan. Nonobstructing stones noted. Will need outpatient evaluation.  Overall, stable. Okay for discharge home today.     PERTINENT LABS:  The results of significant diagnostics from this hospitalization (including imaging, microbiology, ancillary and laboratory) are listed below for reference.    Microbiology: Recent Results (from the past 240 hour(s))  C difficile quick scan w PCR reflex     Status: None   Collection Time: 12/16/16  6:16 PM  Result Value Ref Range Status   C Diff antigen NEGATIVE NEGATIVE Final   C Diff toxin NEGATIVE NEGATIVE Final   C Diff interpretation No C. difficile detected.  Final  Gastrointestinal Panel by PCR , Stool     Status: Abnormal   Collection Time: 12/16/16  6:16 PM  Result Value Ref Range Status   Campylobacter species DETECTED (A) NOT DETECTED Final    Comment: RESULT CALLED TO, READ BACK BY AND VERIFIED WITH: JOANN HAMCE 12/17/16 AT 1431 BY HS    Plesimonas shigelloides NOT DETECTED NOT DETECTED Final   Salmonella species NOT DETECTED NOT DETECTED Final   Yersinia enterocolitica NOT DETECTED NOT DETECTED Final   Vibrio species NOT DETECTED NOT DETECTED Final   Vibrio cholerae NOT DETECTED NOT DETECTED Final   Enteroaggregative E coli (EAEC) NOT DETECTED NOT DETECTED Final  Enteropathogenic E coli (EPEC) NOT DETECTED NOT DETECTED Final   Enterotoxigenic E coli (ETEC) NOT DETECTED NOT DETECTED Final   Shiga like toxin producing E coli (STEC) NOT DETECTED NOT DETECTED Final    Shigella/Enteroinvasive E coli (EIEC) NOT DETECTED NOT DETECTED Final   Cryptosporidium NOT DETECTED NOT DETECTED Final   Cyclospora cayetanensis NOT DETECTED NOT DETECTED Final   Entamoeba histolytica NOT DETECTED NOT DETECTED Final   Giardia lamblia NOT DETECTED NOT DETECTED Final   Adenovirus F40/41 NOT DETECTED NOT DETECTED Final   Astrovirus NOT DETECTED NOT DETECTED Final   Norovirus GI/GII NOT DETECTED NOT DETECTED Final   Rotavirus A NOT DETECTED NOT DETECTED Final   Sapovirus (I, II, IV, and V) NOT DETECTED NOT DETECTED Final  Blood culture (routine x 2)     Status: None (Preliminary result)   Collection Time: 12/16/16  6:38 PM  Result Value Ref Range Status   Specimen Description BLOOD LEFT FOREARM  Final   Special Requests IN PEDIATRIC BOTTLE Blood Culture adequate volume  Final   Culture NO GROWTH 3 DAYS  Final   Report Status PENDING  Incomplete  Blood culture (routine x 2)     Status: None (Preliminary result)   Collection Time: 12/16/16  6:43 PM  Result Value Ref Range Status   Specimen Description BLOOD LEFT ANTECUBITAL  Final   Special Requests   Final    BOTTLES DRAWN AEROBIC AND ANAEROBIC Blood Culture adequate volume   Culture NO GROWTH 3 DAYS  Final   Report Status PENDING  Incomplete     Labs: Basic Metabolic Panel:  Recent Labs Lab 12/16/16 1229 12/17/16 0557 12/18/16 0533 12/18/16 0922 12/19/16 0525  NA 129* 131* 136  --  138  K 3.4* 3.4* 3.4*  --  3.5  CL 97* 103 107  --  106  CO2 22 24 24   --  27  GLUCOSE 137* 166* 101*  --  98  BUN 6 <5* <5*  --  <5*  CREATININE 0.80 0.61 0.52  --  0.64  CALCIUM 9.1 8.3* 8.6*  --  8.7*  MG  --  1.8  --  1.7 1.9   Liver Function Tests:  Recent Labs Lab 12/16/16 1229 12/17/16 0557 12/18/16 0533 12/19/16 0525  AST 105* 51* 32 25  ALT 208* 124* 95* 76*  ALKPHOS 212* 157* 136* 123  BILITOT 0.6 0.3 0.3 0.6  PROT 6.5 5.1* 5.3* 5.2*  ALBUMIN 3.8 3.0* 3.0* 3.1*    Recent Labs Lab 12/16/16 1229    LIPASE 29   CBC:  Recent Labs Lab 12/16/16 1229 12/17/16 0557 12/18/16 0533 12/19/16 0525  WBC 10.6* 6.7 4.9 5.1  HGB 12.4 10.3* 10.4* 10.2*  HCT 36.2 30.7* 31.1* 30.7*  MCV 87.7 88.7 88.6 90.6  PLT 309 251 277 278     IMAGING STUDIES Ct Abdomen Pelvis W Contrast  Result Date: 12/16/2016 CLINICAL DATA:  Diarrhea and blood in stool. EXAM: CT ABDOMEN AND PELVIS WITH CONTRAST TECHNIQUE: Multidetector CT imaging of the abdomen and pelvis was performed using the standard protocol following bolus administration of intravenous contrast. CONTRAST:  134mL ISOVUE-300 IOPAMIDOL (ISOVUE-300) INJECTION 61% COMPARISON:  None. FINDINGS: Lower chest: No acute abnormality. Moderate-sized hiatal hernia present. Hepatobiliary: No focal liver abnormality is seen. Status post cholecystectomy. No biliary dilatation. Pancreas: Unremarkable. No pancreatic ductal dilatation or surrounding inflammatory changes. Spleen: Normal in size without focal abnormality. Adrenals/Urinary Tract: Adrenal glands are unremarkable. No hydronephrosis. There are some nonobstructing calculi in the  right kidney. The largest measures 4 mm. No ureteral or bladder calculi identified. Bladder is unremarkable. Stomach/Bowel: The bowel shows suggestion of some thickening of the cecum and ascending colon with mild surrounding inflammatory changes in the adjacent fat. There also is some suggestion of rectosigmoid thickening. The sigmoid colon demonstrates diverticulosis without evidence of acute diverticulitis. No evidence of bowel obstruction, free intraperitoneal air or focal abscess. Small bowel is unremarkable. Vascular/Lymphatic: The abdominal aorta demonstrates calcified plaque without evidence of aneurysm. No enlarged abdominal or pelvic lymph nodes. Reproductive: Uterus and bilateral adnexa are unremarkable. Other: No abdominal wall hernia or abnormality. No abdominopelvic ascites. Musculoskeletal: No acute or significant osseous  findings. IMPRESSION: 1. Suggestion of colitis involving the cecum and ascending colon. There may also be some inflammation of the rectosigmoid colon. No evidence of perforation or focal abscess. 2. Nephrolithiasis with nonobstructing calculi in the right kidney. Electronically Signed   By: Aletta Edouard M.D.   On: 12/16/2016 17:03    DISCHARGE EXAMINATION: Vitals:   12/18/16 1500 12/18/16 2206 12/19/16 0605 12/19/16 1350  BP: 137/67 126/75 138/67 (!) 133/57  Pulse: 84 77 88 82  Resp: 17 19 19 18   Temp: 98.2 F (36.8 C) 98.1 F (36.7 C) 98.1 F (36.7 C) 97.8 F (36.6 C)  TempSrc: Oral Oral Oral Oral  SpO2: 98% 98% 98% 97%  Weight:      Height:       General appearance: alert, cooperative, appears stated age and no distress Resp: clear to auscultation bilaterally Cardio: regular rate and rhythm, S1, S2 normal, no murmur, click, rub or gallop GI: soft, non-tender; bowel sounds normal; no masses,  no organomegaly  DISPOSITION: Home  Discharge Instructions    Call MD for:  extreme fatigue    Complete by:  As directed    Call MD for:  persistant dizziness or light-headedness    Complete by:  As directed    Call MD for:  persistant nausea and vomiting    Complete by:  As directed    Call MD for:  severe uncontrolled pain    Complete by:  As directed    Call MD for:  temperature >100.4    Complete by:  As directed    Discharge instructions    Complete by:  As directed    Please be sure to follow-up with a primary care provider within one week. Please seek attention immediately if symptoms get worse. Eats soft diet for the next 1-2 days and then you may advance it to your usual diet.  You were cared for by a hospitalist during your hospital stay. If you have any questions about your discharge medications or the care you received while you were in the hospital after you are discharged, you can call the unit and asked to speak with the hospitalist on call if the hospitalist that took  care of you is not available. Once you are discharged, your primary care physician will handle any further medical issues. Please note that NO REFILLS for any discharge medications will be authorized once you are discharged, as it is imperative that you return to your primary care physician (or establish a relationship with a primary care physician if you do not have one) for your aftercare needs so that they can reassess your need for medications and monitor your lab values. If you do not have a primary care physician, you can call [phone] for a physician referral.   Increase activity slowly    Complete by:  As directed       ALLERGIES:  Allergies  Allergen Reactions  . Calcium-Containing Compounds Other (See Comments)    Restless legs     Discharge Medication List as of 12/19/2016  2:00 PM    START taking these medications   Details  acetaminophen (TYLENOL) 325 MG tablet Take 2 tablets (650 mg total) by mouth every 6 (six) hours as needed for mild pain or fever., Starting Mon 12/19/2016, OTC    azithromycin (ZITHROMAX) 500 MG tablet Take 1 tablet (500 mg total) by mouth daily. For 4 more days starting 12/20/16, Starting Tue 12/20/2016, Until Sat 12/24/2016, Normal      CONTINUE these medications which have NOT CHANGED   Details  amLODipine (NORVASC) 5 MG tablet Take 5 mg by mouth daily., Starting Wed 11/30/2016, Historical Med    aspirin 325 MG tablet Take 325 mg by mouth daily., Until Discontinued, Historical Med    atorvastatin (LIPITOR) 20 MG tablet Take 20 mg by mouth daily., Until Discontinued, Historical Med    Cholecalciferol (VITAMIN D-3 PO) Take 1 capsule by mouth at bedtime., Historical Med    hydrochlorothiazide (HYDRODIURIL) 25 MG tablet Take 12.5 mg by mouth daily. , Historical Med    levothyroxine (SYNTHROID, LEVOTHROID) 137 MCG tablet Take 137 mcg by mouth daily before breakfast., Starting Thu 12/01/2016, Historical Med    losartan (COZAAR) 50 MG tablet Take 100 mg by  mouth every morning., Starting Wed 09/14/2016, Historical Med    Multiple Vitamins-Minerals (PRESERVISION AREDS 2) CAPS Take 1 capsule by mouth 2 (two) times daily., Historical Med    omeprazole (PRILOSEC) 20 MG capsule Take 20 mg by mouth 2 (two) times daily before a meal. , Starting Thu 11/24/2016, Historical Med    sertraline (ZOLOFT) 50 MG tablet Take 25 mg by mouth at bedtime., Starting Fri 10/28/2016, Historical Med    traZODone (DESYREL) 50 MG tablet Take 0.5 tablets (25 mg total) by mouth at bedtime as needed for sleep., Starting Tue 12/30/2014, No Print    VITAMIN A PO Take 1 tablet by mouth at bedtime. , Historical Med      STOP taking these medications     Cod Liver Oil (COD LIVER PO)      folic acid (FOLVITE) 1 MG tablet          Follow-up Information    Fulp, Cammie, MD. Schedule an appointment as soon as possible for a visit in 1 week(s).   Specialty:  Family Medicine Contact information: [address] [place] [address] [phone]           TOTAL DISCHARGE TIME: 35 mins  [facility] Pager [phone]  12/19/2016, 4:20 PM

## 2016-12-19 NOTE — Progress Notes (Signed)
Pt discharged home per MD order. AVS reviewed with patient and questions answered in full. Pt knows that Azithromycin script was electronically sent to [facility] and to start that tomorrow. All belongings sent home with patient.  Blood pressure (!) 133/57, pulse 82, temperature 97.8 F (36.6 C), temperature source Oral, resp. rate 18, height 5\' 6"  (1.676 m), weight 68.6 kg (151 lb 3.2 oz), SpO2 97 %.

## 2016-12-20 LAB — HEPATITIS PANEL, ACUTE
HCV Ab: <0.1 s/co ratio (ref 0.0–0.9)
HEP B C IGM: NEGATIVE
HEP B S AG: NEGATIVE
Hep A IgM: NEGATIVE

## 2016-12-21 LAB — CULTURE, BLOOD (ROUTINE X 2)
Culture: NO GROWTH
Culture: NO GROWTH
SPECIAL REQUESTS: ADEQUATE
Special Requests: ADEQUATE

## 2016-12-26 DIAGNOSIS — H43813 Vitreous degeneration, bilateral: Secondary | ICD-10-CM | POA: Diagnosis not present

## 2016-12-26 DIAGNOSIS — H353123 Nonexudative age-related macular degeneration, left eye, advanced atrophic without subfoveal involvement: Secondary | ICD-10-CM | POA: Diagnosis not present

## 2016-12-26 DIAGNOSIS — H353211 Exudative age-related macular degeneration, right eye, with active choroidal neovascularization: Secondary | ICD-10-CM | POA: Diagnosis not present

## 2016-12-28 DIAGNOSIS — H353122 Nonexudative age-related macular degeneration, left eye, intermediate dry stage: Secondary | ICD-10-CM | POA: Diagnosis not present

## 2016-12-30 DIAGNOSIS — E876 Hypokalemia: Secondary | ICD-10-CM | POA: Diagnosis not present

## 2016-12-30 DIAGNOSIS — E871 Hypo-osmolality and hyponatremia: Secondary | ICD-10-CM | POA: Diagnosis not present

## 2016-12-30 DIAGNOSIS — R945 Abnormal results of liver function studies: Secondary | ICD-10-CM | POA: Diagnosis not present

## 2016-12-30 DIAGNOSIS — D649 Anemia, unspecified: Secondary | ICD-10-CM | POA: Diagnosis not present

## 2016-12-30 DIAGNOSIS — K529 Noninfective gastroenteritis and colitis, unspecified: Secondary | ICD-10-CM | POA: Diagnosis not present

## 2017-01-06 DIAGNOSIS — M1712 Unilateral primary osteoarthritis, left knee: Secondary | ICD-10-CM | POA: Diagnosis not present

## 2017-01-27 DIAGNOSIS — H353122 Nonexudative age-related macular degeneration, left eye, intermediate dry stage: Secondary | ICD-10-CM | POA: Diagnosis not present

## 2017-02-06 DIAGNOSIS — H353123 Nonexudative age-related macular degeneration, left eye, advanced atrophic without subfoveal involvement: Secondary | ICD-10-CM | POA: Diagnosis not present

## 2017-02-06 DIAGNOSIS — H353211 Exudative age-related macular degeneration, right eye, with active choroidal neovascularization: Secondary | ICD-10-CM | POA: Diagnosis not present

## 2017-02-06 DIAGNOSIS — H43813 Vitreous degeneration, bilateral: Secondary | ICD-10-CM | POA: Diagnosis not present

## 2017-02-26 DIAGNOSIS — H353122 Nonexudative age-related macular degeneration, left eye, intermediate dry stage: Secondary | ICD-10-CM | POA: Diagnosis not present

## 2017-03-15 DIAGNOSIS — L0291 Cutaneous abscess, unspecified: Secondary | ICD-10-CM | POA: Diagnosis not present

## 2017-03-15 DIAGNOSIS — Z23 Encounter for immunization: Secondary | ICD-10-CM | POA: Diagnosis not present

## 2017-03-15 DIAGNOSIS — R200 Anesthesia of skin: Secondary | ICD-10-CM | POA: Diagnosis not present

## 2017-03-15 DIAGNOSIS — R4020 Unspecified coma: Secondary | ICD-10-CM | POA: Diagnosis not present

## 2017-03-20 DIAGNOSIS — H353211 Exudative age-related macular degeneration, right eye, with active choroidal neovascularization: Secondary | ICD-10-CM | POA: Diagnosis not present

## 2017-03-20 DIAGNOSIS — H353123 Nonexudative age-related macular degeneration, left eye, advanced atrophic without subfoveal involvement: Secondary | ICD-10-CM | POA: Diagnosis not present

## 2017-03-20 DIAGNOSIS — H43813 Vitreous degeneration, bilateral: Secondary | ICD-10-CM | POA: Diagnosis not present

## 2017-03-28 DIAGNOSIS — H353122 Nonexudative age-related macular degeneration, left eye, intermediate dry stage: Secondary | ICD-10-CM | POA: Diagnosis not present

## 2017-04-20 DIAGNOSIS — R55 Syncope and collapse: Secondary | ICD-10-CM | POA: Diagnosis not present

## 2017-04-20 DIAGNOSIS — I951 Orthostatic hypotension: Secondary | ICD-10-CM | POA: Diagnosis not present

## 2017-04-20 DIAGNOSIS — E7800 Pure hypercholesterolemia, unspecified: Secondary | ICD-10-CM | POA: Diagnosis not present

## 2017-04-20 DIAGNOSIS — I10 Essential (primary) hypertension: Secondary | ICD-10-CM | POA: Diagnosis not present

## 2017-04-21 DIAGNOSIS — M1712 Unilateral primary osteoarthritis, left knee: Secondary | ICD-10-CM | POA: Diagnosis not present

## 2017-04-24 DIAGNOSIS — H353211 Exudative age-related macular degeneration, right eye, with active choroidal neovascularization: Secondary | ICD-10-CM | POA: Diagnosis not present

## 2017-04-24 DIAGNOSIS — H353123 Nonexudative age-related macular degeneration, left eye, advanced atrophic without subfoveal involvement: Secondary | ICD-10-CM | POA: Diagnosis not present

## 2017-04-24 DIAGNOSIS — H43813 Vitreous degeneration, bilateral: Secondary | ICD-10-CM | POA: Diagnosis not present

## 2017-04-28 DIAGNOSIS — R55 Syncope and collapse: Secondary | ICD-10-CM | POA: Diagnosis not present

## 2017-04-28 DIAGNOSIS — I10 Essential (primary) hypertension: Secondary | ICD-10-CM | POA: Diagnosis not present

## 2017-04-28 DIAGNOSIS — E7800 Pure hypercholesterolemia, unspecified: Secondary | ICD-10-CM | POA: Diagnosis not present

## 2017-04-28 DIAGNOSIS — Z8673 Personal history of transient ischemic attack (TIA), and cerebral infarction without residual deficits: Secondary | ICD-10-CM | POA: Diagnosis not present

## 2017-05-10 DIAGNOSIS — Z8673 Personal history of transient ischemic attack (TIA), and cerebral infarction without residual deficits: Secondary | ICD-10-CM | POA: Diagnosis not present

## 2017-05-10 DIAGNOSIS — I10 Essential (primary) hypertension: Secondary | ICD-10-CM | POA: Diagnosis not present

## 2017-05-10 DIAGNOSIS — R55 Syncope and collapse: Secondary | ICD-10-CM | POA: Diagnosis not present

## 2017-05-17 DIAGNOSIS — I10 Essential (primary) hypertension: Secondary | ICD-10-CM | POA: Diagnosis not present

## 2017-05-17 DIAGNOSIS — E7800 Pure hypercholesterolemia, unspecified: Secondary | ICD-10-CM | POA: Diagnosis not present

## 2017-05-19 DIAGNOSIS — I10 Essential (primary) hypertension: Secondary | ICD-10-CM | POA: Diagnosis not present

## 2017-05-19 DIAGNOSIS — E7800 Pure hypercholesterolemia, unspecified: Secondary | ICD-10-CM | POA: Diagnosis not present

## 2017-05-19 DIAGNOSIS — I951 Orthostatic hypotension: Secondary | ICD-10-CM | POA: Diagnosis not present

## 2017-05-19 DIAGNOSIS — R55 Syncope and collapse: Secondary | ICD-10-CM | POA: Diagnosis not present

## 2017-05-25 DIAGNOSIS — Z8673 Personal history of transient ischemic attack (TIA), and cerebral infarction without residual deficits: Secondary | ICD-10-CM | POA: Diagnosis not present

## 2017-05-27 DIAGNOSIS — H353122 Nonexudative age-related macular degeneration, left eye, intermediate dry stage: Secondary | ICD-10-CM | POA: Diagnosis not present

## 2017-06-05 DIAGNOSIS — H353123 Nonexudative age-related macular degeneration, left eye, advanced atrophic without subfoveal involvement: Secondary | ICD-10-CM | POA: Diagnosis not present

## 2017-06-05 DIAGNOSIS — H353211 Exudative age-related macular degeneration, right eye, with active choroidal neovascularization: Secondary | ICD-10-CM | POA: Diagnosis not present

## 2017-06-05 DIAGNOSIS — H43813 Vitreous degeneration, bilateral: Secondary | ICD-10-CM | POA: Diagnosis not present

## 2017-06-28 DIAGNOSIS — M1712 Unilateral primary osteoarthritis, left knee: Secondary | ICD-10-CM | POA: Diagnosis not present

## 2017-07-05 DIAGNOSIS — M1712 Unilateral primary osteoarthritis, left knee: Secondary | ICD-10-CM | POA: Diagnosis not present

## 2017-07-11 DIAGNOSIS — M1712 Unilateral primary osteoarthritis, left knee: Secondary | ICD-10-CM | POA: Diagnosis not present

## 2017-07-17 DIAGNOSIS — H353123 Nonexudative age-related macular degeneration, left eye, advanced atrophic without subfoveal involvement: Secondary | ICD-10-CM | POA: Diagnosis not present

## 2017-07-17 DIAGNOSIS — H43813 Vitreous degeneration, bilateral: Secondary | ICD-10-CM | POA: Diagnosis not present

## 2017-07-17 DIAGNOSIS — H353211 Exudative age-related macular degeneration, right eye, with active choroidal neovascularization: Secondary | ICD-10-CM | POA: Diagnosis not present

## 2017-07-25 DIAGNOSIS — E785 Hyperlipidemia, unspecified: Secondary | ICD-10-CM | POA: Diagnosis not present

## 2017-07-25 DIAGNOSIS — H3530 Unspecified macular degeneration: Secondary | ICD-10-CM | POA: Diagnosis not present

## 2017-07-25 DIAGNOSIS — R251 Tremor, unspecified: Secondary | ICD-10-CM | POA: Diagnosis not present

## 2017-07-25 DIAGNOSIS — F39 Unspecified mood [affective] disorder: Secondary | ICD-10-CM | POA: Diagnosis not present

## 2017-07-25 DIAGNOSIS — M1612 Unilateral primary osteoarthritis, left hip: Secondary | ICD-10-CM | POA: Diagnosis not present

## 2017-07-25 DIAGNOSIS — Z8673 Personal history of transient ischemic attack (TIA), and cerebral infarction without residual deficits: Secondary | ICD-10-CM | POA: Diagnosis not present

## 2017-07-25 DIAGNOSIS — K219 Gastro-esophageal reflux disease without esophagitis: Secondary | ICD-10-CM | POA: Diagnosis not present

## 2017-07-25 DIAGNOSIS — E039 Hypothyroidism, unspecified: Secondary | ICD-10-CM | POA: Diagnosis not present

## 2017-07-25 DIAGNOSIS — I10 Essential (primary) hypertension: Secondary | ICD-10-CM | POA: Diagnosis not present

## 2017-07-26 DIAGNOSIS — H353122 Nonexudative age-related macular degeneration, left eye, intermediate dry stage: Secondary | ICD-10-CM | POA: Diagnosis not present

## 2017-08-24 DIAGNOSIS — M1712 Unilateral primary osteoarthritis, left knee: Secondary | ICD-10-CM | POA: Diagnosis not present

## 2017-08-28 DIAGNOSIS — H353123 Nonexudative age-related macular degeneration, left eye, advanced atrophic without subfoveal involvement: Secondary | ICD-10-CM | POA: Diagnosis not present

## 2017-08-28 DIAGNOSIS — H43813 Vitreous degeneration, bilateral: Secondary | ICD-10-CM | POA: Diagnosis not present

## 2017-08-28 DIAGNOSIS — H353211 Exudative age-related macular degeneration, right eye, with active choroidal neovascularization: Secondary | ICD-10-CM | POA: Diagnosis not present

## 2017-10-02 DIAGNOSIS — H353123 Nonexudative age-related macular degeneration, left eye, advanced atrophic without subfoveal involvement: Secondary | ICD-10-CM | POA: Diagnosis not present

## 2017-10-02 DIAGNOSIS — H353211 Exudative age-related macular degeneration, right eye, with active choroidal neovascularization: Secondary | ICD-10-CM | POA: Diagnosis not present

## 2017-10-02 DIAGNOSIS — H43813 Vitreous degeneration, bilateral: Secondary | ICD-10-CM | POA: Diagnosis not present

## 2017-10-04 DIAGNOSIS — E039 Hypothyroidism, unspecified: Secondary | ICD-10-CM | POA: Diagnosis not present

## 2017-10-24 DIAGNOSIS — H353122 Nonexudative age-related macular degeneration, left eye, intermediate dry stage: Secondary | ICD-10-CM | POA: Diagnosis not present

## 2017-11-01 DIAGNOSIS — Z86010 Personal history of colonic polyps: Secondary | ICD-10-CM | POA: Diagnosis not present

## 2017-11-06 DIAGNOSIS — H43813 Vitreous degeneration, bilateral: Secondary | ICD-10-CM | POA: Diagnosis not present

## 2017-11-06 DIAGNOSIS — H353123 Nonexudative age-related macular degeneration, left eye, advanced atrophic without subfoveal involvement: Secondary | ICD-10-CM | POA: Diagnosis not present

## 2017-11-06 DIAGNOSIS — H353211 Exudative age-related macular degeneration, right eye, with active choroidal neovascularization: Secondary | ICD-10-CM | POA: Diagnosis not present

## 2017-11-06 DIAGNOSIS — H18831 Recurrent erosion of cornea, right eye: Secondary | ICD-10-CM | POA: Diagnosis not present

## 2017-11-23 DIAGNOSIS — H353122 Nonexudative age-related macular degeneration, left eye, intermediate dry stage: Secondary | ICD-10-CM | POA: Diagnosis not present

## 2017-12-08 DIAGNOSIS — M1712 Unilateral primary osteoarthritis, left knee: Secondary | ICD-10-CM | POA: Diagnosis not present

## 2017-12-11 DIAGNOSIS — H35033 Hypertensive retinopathy, bilateral: Secondary | ICD-10-CM | POA: Diagnosis not present

## 2017-12-11 DIAGNOSIS — H43813 Vitreous degeneration, bilateral: Secondary | ICD-10-CM | POA: Diagnosis not present

## 2017-12-11 DIAGNOSIS — H353211 Exudative age-related macular degeneration, right eye, with active choroidal neovascularization: Secondary | ICD-10-CM | POA: Diagnosis not present

## 2017-12-11 DIAGNOSIS — H353122 Nonexudative age-related macular degeneration, left eye, intermediate dry stage: Secondary | ICD-10-CM | POA: Diagnosis not present

## 2017-12-19 DIAGNOSIS — R630 Anorexia: Secondary | ICD-10-CM | POA: Diagnosis not present

## 2017-12-19 DIAGNOSIS — E039 Hypothyroidism, unspecified: Secondary | ICD-10-CM | POA: Diagnosis not present

## 2017-12-19 DIAGNOSIS — R110 Nausea: Secondary | ICD-10-CM | POA: Diagnosis not present

## 2017-12-19 DIAGNOSIS — R634 Abnormal weight loss: Secondary | ICD-10-CM | POA: Diagnosis not present

## 2017-12-19 DIAGNOSIS — R1084 Generalized abdominal pain: Secondary | ICD-10-CM | POA: Diagnosis not present

## 2017-12-22 ENCOUNTER — Other Ambulatory Visit: Payer: Self-pay | Admitting: Family Medicine

## 2017-12-22 DIAGNOSIS — R1084 Generalized abdominal pain: Secondary | ICD-10-CM

## 2017-12-25 DIAGNOSIS — E785 Hyperlipidemia, unspecified: Secondary | ICD-10-CM | POA: Diagnosis not present

## 2017-12-25 DIAGNOSIS — Z8673 Personal history of transient ischemic attack (TIA), and cerebral infarction without residual deficits: Secondary | ICD-10-CM | POA: Diagnosis not present

## 2017-12-25 DIAGNOSIS — R110 Nausea: Secondary | ICD-10-CM | POA: Diagnosis not present

## 2017-12-25 DIAGNOSIS — E039 Hypothyroidism, unspecified: Secondary | ICD-10-CM | POA: Diagnosis not present

## 2017-12-25 DIAGNOSIS — M8580 Other specified disorders of bone density and structure, unspecified site: Secondary | ICD-10-CM | POA: Diagnosis not present

## 2017-12-25 DIAGNOSIS — I6529 Occlusion and stenosis of unspecified carotid artery: Secondary | ICD-10-CM | POA: Diagnosis not present

## 2017-12-25 DIAGNOSIS — I10 Essential (primary) hypertension: Secondary | ICD-10-CM | POA: Diagnosis not present

## 2017-12-25 DIAGNOSIS — F39 Unspecified mood [affective] disorder: Secondary | ICD-10-CM | POA: Diagnosis not present

## 2017-12-26 DIAGNOSIS — M1712 Unilateral primary osteoarthritis, left knee: Secondary | ICD-10-CM | POA: Diagnosis not present

## 2018-01-02 ENCOUNTER — Ambulatory Visit
Admission: RE | Admit: 2018-01-02 | Discharge: 2018-01-02 | Disposition: A | Discharge status: Home / Self Care | Payer: Medicare Other | Source: Ambulatory Visit | Attending: Family Medicine | Admitting: Family Medicine

## 2018-01-02 DIAGNOSIS — R1084 Generalized abdominal pain: Secondary | ICD-10-CM

## 2018-01-02 DIAGNOSIS — N200 Calculus of kidney: Secondary | ICD-10-CM | POA: Diagnosis not present

## 2018-01-02 IMAGING — CT CT ABD-PELV W/ CM
2 of 5 series · 16 of 46 positions shown, 18 images · IV contrast (iopamidol)
Comparison: [DATE]

CLINICAL DATA: Pelvic and umbilical pain.  Nausea, vomiting

EXAM:
CT ABDOMEN AND PELVIS WITH CONTRAST
TECHNIQUE: Multidetector CT imaging of the abdomen and pelvis was performed
using the standard protocol following bolus administration of
intravenous contrast.
CONTRAST:  100mL [LM] IOPAMIDOL ([LM]) INJECTION 61%

[Series 2: abd pelvis 5.00 br40 s3 ax · axial · 0.64mm/px · z∈[+1120,+1520]mm · 13 of 90 slices shown, 15 images]
[im 5/90  soft-tissue]
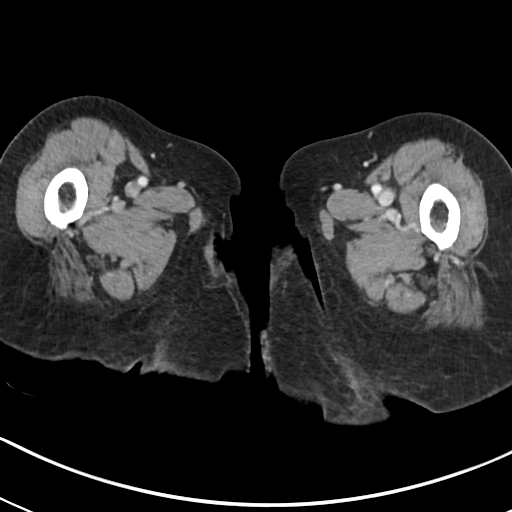
[im 5/90  bone]
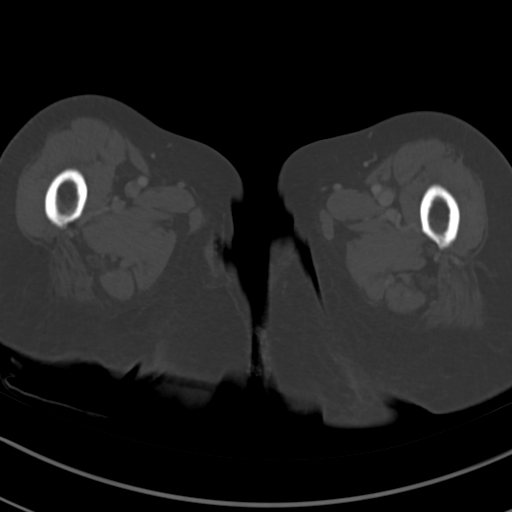
[im 14/90  soft-tissue]
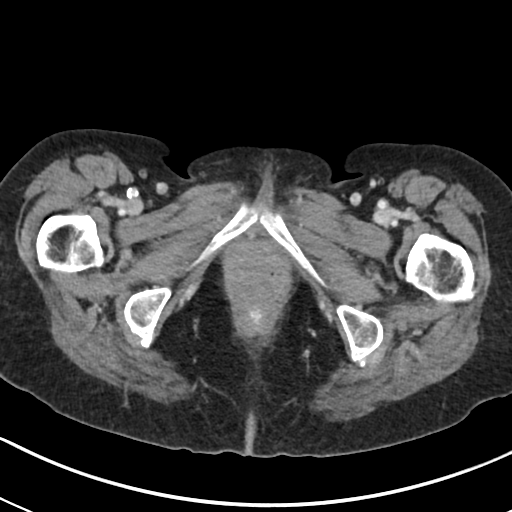
[im 18/90  soft-tissue]
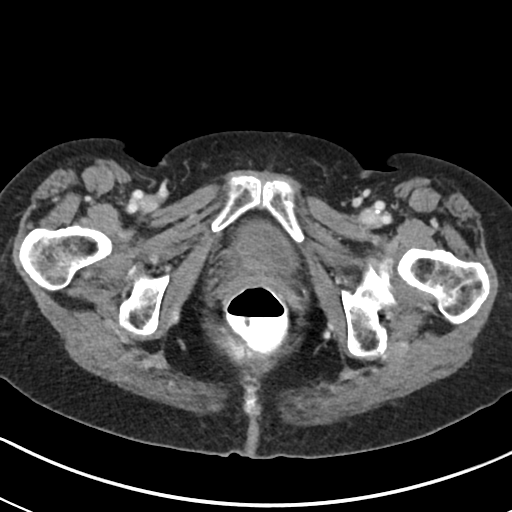
[im 27/90  soft-tissue]
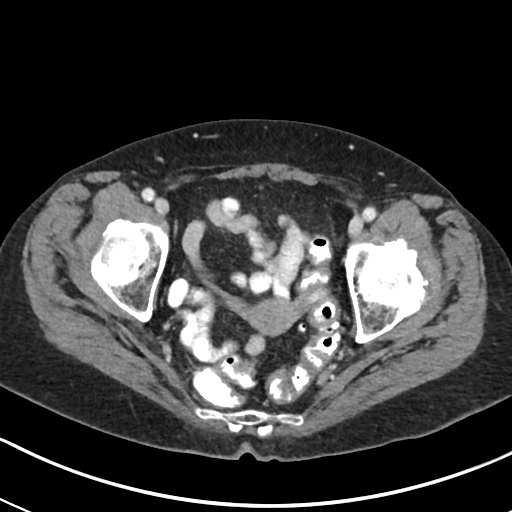
[im 32/90  soft-tissue]
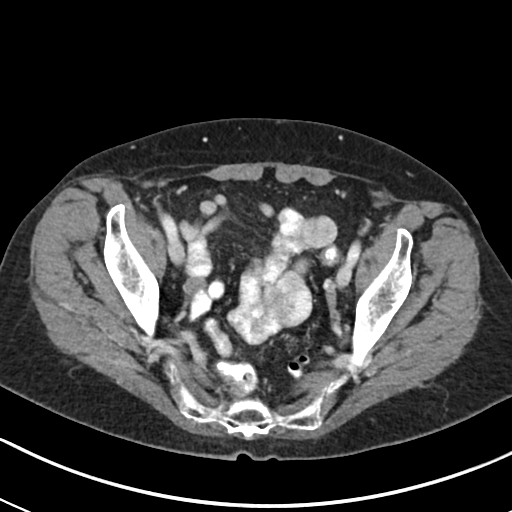
[im 41/90  soft-tissue]
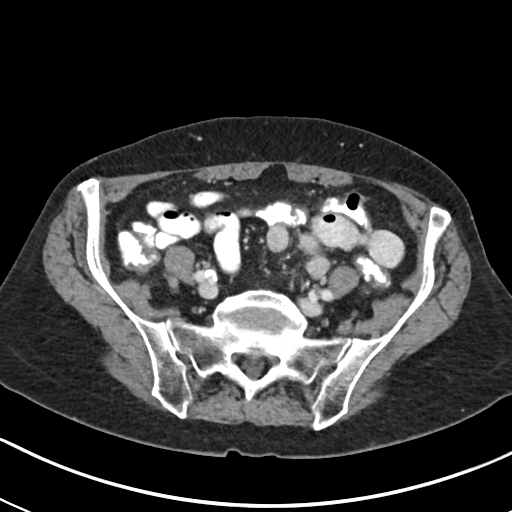
[im 45/90  soft-tissue]
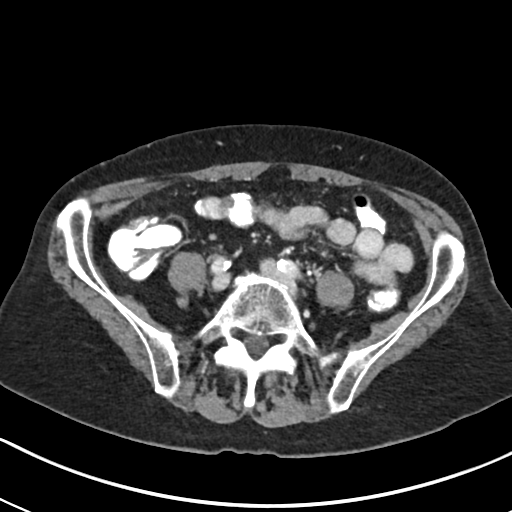
[im 49/90  soft-tissue]
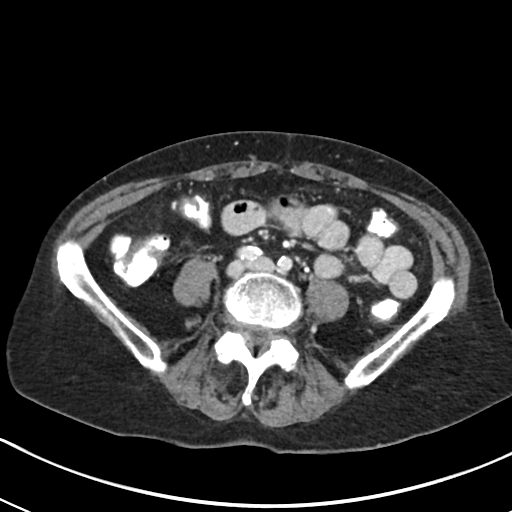
[im 58/90  soft-tissue]
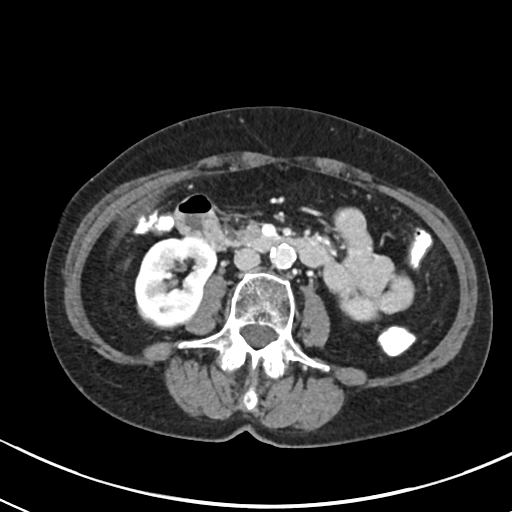
[im 58/90  bone]
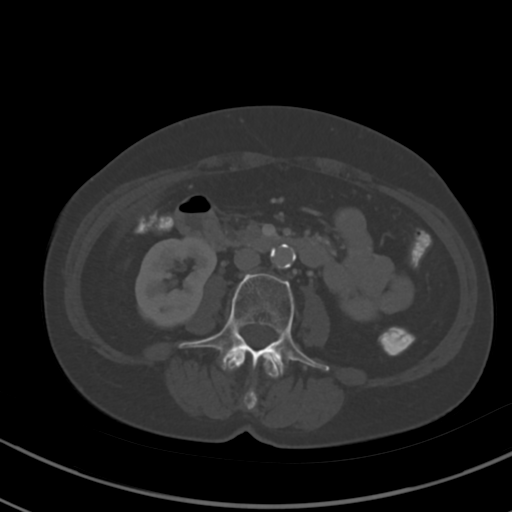
[im 63/90  soft-tissue]
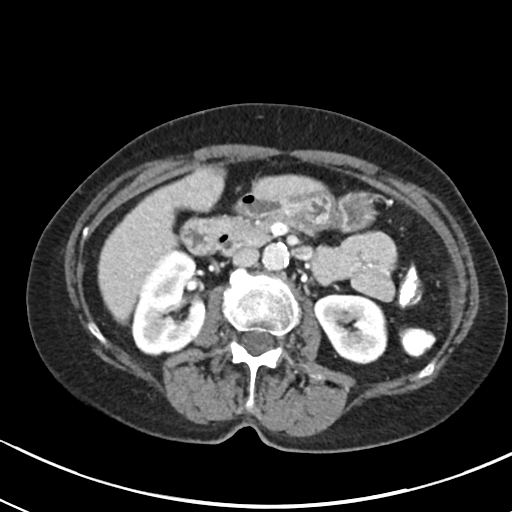
[im 72/90  soft-tissue]
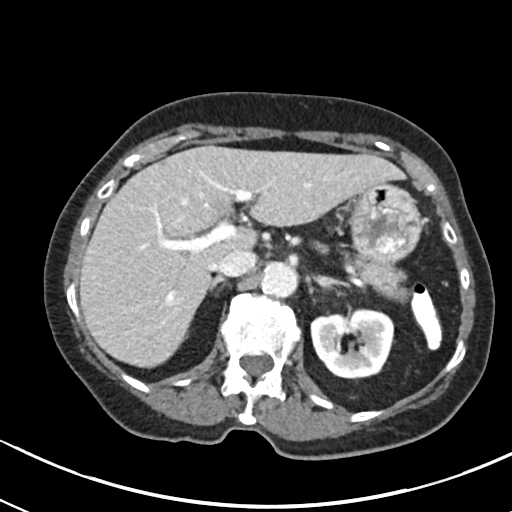
[im 76/90  soft-tissue]
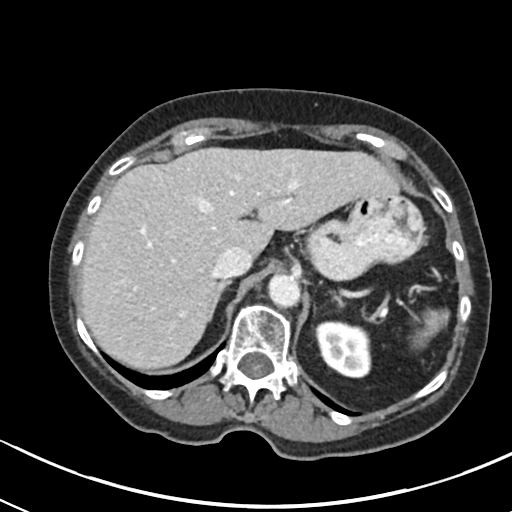
[im 85/90  soft-tissue]
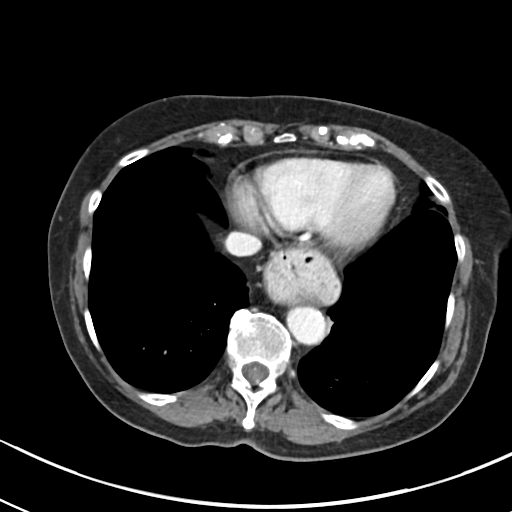

[Series 6: abd pelvis 2.00 br40 s3 cor · coronal · 0.64mm/px · 3 of 122 slices shown]
[im 41/122  soft-tissue]
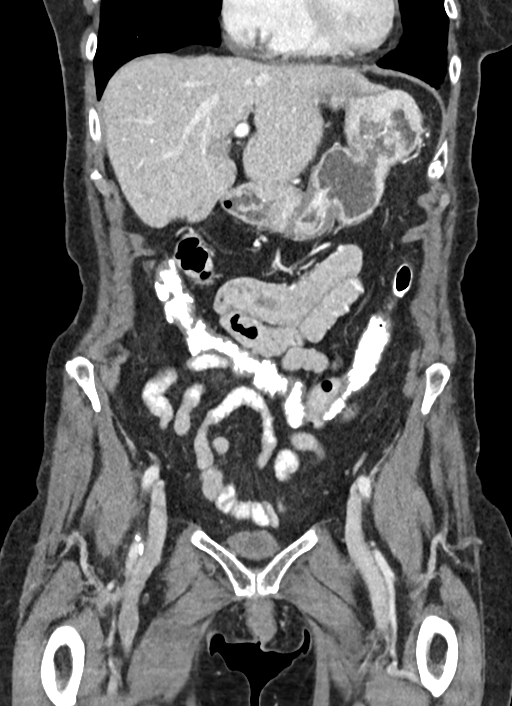
[im 54/122  soft-tissue]
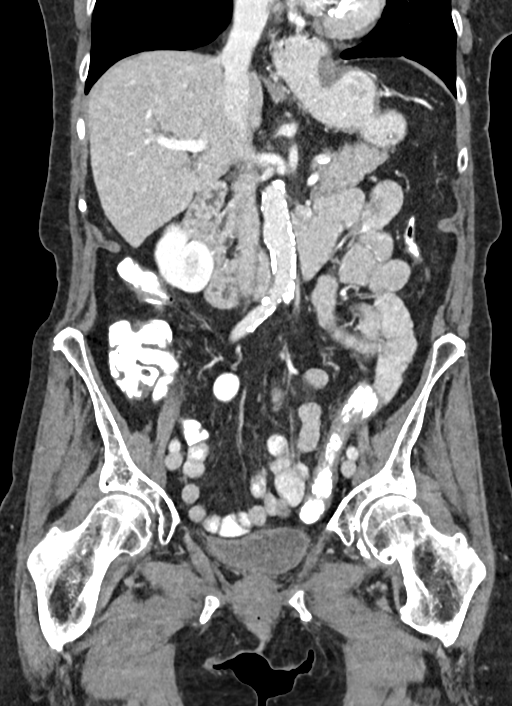
[im 68/122  soft-tissue]
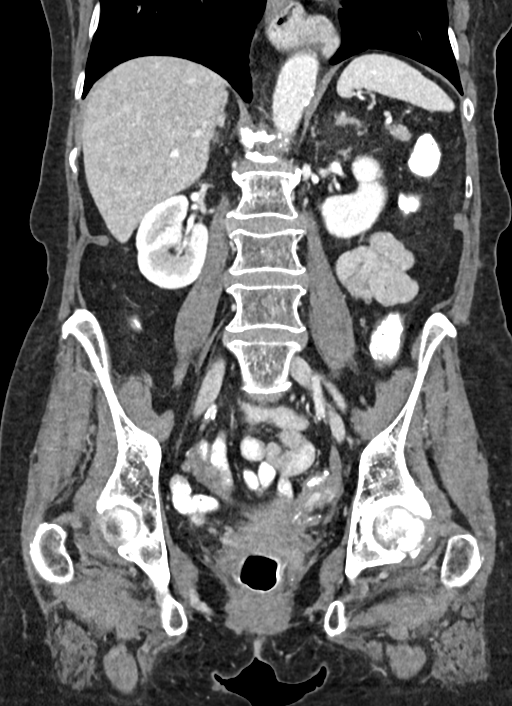

[16 of 46 positions shown; findings below may reference images not displayed]

FINDINGS: Lower chest: No acute abnormality.

Hepatobiliary: No focal liver abnormality is seen. Low attenuation
on either side of the falciform ligament likely reflecting focal
fatty deposition. Status post cholecystectomy. No biliary
dilatation.

Pancreas: Unremarkable. No pancreatic ductal dilatation or
surrounding inflammatory changes.

Spleen: Normal in size without focal abnormality.

Adrenals/Urinary Tract: Adrenal glands are unremarkable.
Nonobstructing right nephrolithiasis. No hydronephrosis. No renal
mass. Bladder is unremarkable.

Stomach/Bowel: Moderate hiatal hernia. Stomach is otherwise within
normal limits. No evidence of bowel wall thickening, distention, or
inflammatory changes. Diverticulosis without evidence of
diverticulitis.

Vascular/Lymphatic: Aortic atherosclerosis. No enlarged abdominal or
pelvic lymph nodes.

Reproductive: Uterus and bilateral adnexa are unremarkable.

Other: No abdominal wall hernia or abnormality. No abdominopelvic
ascites.

Musculoskeletal: No acute osseous abnormality. No aggressive osseous
lesion. L1 vertebral body compression fracture approximately 50%
height loss unchanged from [DATE]. moderate-severe
osteoarthritis of the left hip.
IMPRESSION: 1. No acute abdominal or pelvic pathology.
2.  Aortic Atherosclerosis ([LM]-[LM]).
3. Nonobstructing right nephrolithiasis.

## 2018-01-02 MED ORDER — IOPAMIDOL (ISOVUE-300) INJECTION 61%
100.0000 mL | Freq: Once | INTRAVENOUS | Status: AC | PRN
  Administered 2018-01-02: 100 mL via INTRAVENOUS

## 2018-01-15 DIAGNOSIS — H353123 Nonexudative age-related macular degeneration, left eye, advanced atrophic without subfoveal involvement: Secondary | ICD-10-CM | POA: Diagnosis not present

## 2018-01-15 DIAGNOSIS — H43813 Vitreous degeneration, bilateral: Secondary | ICD-10-CM | POA: Diagnosis not present

## 2018-01-15 DIAGNOSIS — H353211 Exudative age-related macular degeneration, right eye, with active choroidal neovascularization: Secondary | ICD-10-CM | POA: Diagnosis not present

## 2018-01-15 DIAGNOSIS — H35033 Hypertensive retinopathy, bilateral: Secondary | ICD-10-CM | POA: Diagnosis not present

## 2018-01-16 NOTE — H&P (Signed)
TOTAL KNEE ADMISSION H&P  Patient is being admitted for left total knee arthroplasty.  Subjective:  Chief Complaint:left knee pain.  HPI: Diane Chambers, 84 y.o. female, has a history of pain and functional disability in the left knee due to arthritis and has failed non-surgical conservative treatments for greater than 12 weeks to includecorticosteriod injections, viscosupplementation injections and activity modification.  Onset of symptoms was abrupt, starting several years ago with gradually worsening course since that time. The patient noted no past surgery on the left knee(s).  Patient currently rates pain in the left knee(s) at 5 out of 10 with activity. Patient has pain that interferes with activities of daily living, crepitus and joint swelling.  Patient has evidence of progressive joint-space narrowing in the lateral compartment compared to previous films by imaging studies. There is no active infection.  Patient Active Problem List   Diagnosis Date Noted  . Colitis 12/16/2016  . Confusion 12/29/2014  . TIA (transient ischemic attack) 12/29/2014  . Essential hypertension 12/29/2014  . Hyponatremia 12/29/2014   Past Medical History:  Diagnosis Date  . GERD (gastroesophageal reflux disease)   . Hypertension   . TIA (transient ischemic attack)    times 4    Past Surgical History:  Procedure Laterality Date  . CHOLECYSTECTOMY    . FRACTURE SURGERY    . JOINT REPLACEMENT     right knee    No current facility-administered medications for this encounter.    Current Outpatient Medications  Medication Sig Dispense Refill Last Dose  . amLODipine (NORVASC) 5 MG tablet Take 5 mg by mouth daily.   12/15/2016 at am  . aspirin 325 MG tablet Take 325 mg by mouth at bedtime.    12/15/2016 at 2000  . atorvastatin (LIPITOR) 20 MG tablet Take 20 mg by mouth at bedtime.    12/15/2016 at pm  . Coenzyme Q10 (COQ10) 100 MG CAPS Take 100 mg by mouth daily after supper.     . Glucosamine 750 MG TABS  Take 750 mg by mouth daily after supper.     . Hyaluronic Acid-Vitamin C (HYALURONIC ACID PO) Take 1 capsule by mouth every evening.     Marland Kitchen ibuprofen (ADVIL,MOTRIN) 200 MG tablet Take 200-400 mg by mouth every 6 (six) hours as needed for headache or moderate pain.     Marland Kitchen levothyroxine (SYNTHROID, LEVOTHROID) 112 MCG tablet Take 112 mcg by mouth daily before breakfast.    12/15/2016 at am  . losartan (COZAAR) 100 MG tablet Take 100 mg by mouth daily.    12/15/2016 at am  . Multiple Vitamins-Minerals (PRESERVISION AREDS 2) CAPS Take 1 capsule by mouth every evening.    12/15/2016 at pm  . omeprazole (PRILOSEC) 20 MG capsule Take 20 mg by mouth 2 (two) times daily before a meal.    12/15/2016 at Unknown time  . sertraline (ZOLOFT) 50 MG tablet Take 50 mg by mouth at bedtime.    12/15/2016 at pm  . traZODone (DESYREL) 50 MG tablet Take 0.5 tablets (25 mg total) by mouth at bedtime as needed for sleep. (Patient taking differently: Take 50 mg by mouth at bedtime. )   12/15/2016 at pm  . vitamin B-12 (CYANOCOBALAMIN) 500 MCG tablet Take 500 mcg by mouth daily after supper.     . vitamin C (ASCORBIC ACID) 500 MG tablet Take 2,000 mg by mouth daily after supper.      Allergies  Allergen Reactions  . Calcium-Containing Compounds Other (See Comments)  Restless legs    Social History   Tobacco Use  . Smoking status: Former Research scientist (life sciences)  . Smokeless tobacco: Never Used  Substance Use Topics  . Alcohol use: Yes    Comment: 1 drink per night    Family History  Problem Relation Age of Onset  . Diabetes Mother   . Hyperlipidemia Father      Review of Systems  Constitutional: Negative for chills and fever.  HENT: Negative for congestion, sore throat and tinnitus.   Eyes: Negative for double vision, photophobia and pain.  Respiratory: Negative for cough, shortness of breath and wheezing.   Cardiovascular: Negative for chest pain, palpitations and orthopnea.  Gastrointestinal: Negative for heartburn, nausea  and vomiting.  Genitourinary: Negative for dysuria, frequency and urgency.  Musculoskeletal: Positive for joint pain.  Neurological: Negative for dizziness, weakness and headaches.  Psychiatric/Behavioral: Negative for depression.    Objective:  Physical Exam  Well nourished and well developed. General: Alert and oriented x3, cooperative and pleasant, no acute distress. Head: normocephalic, atraumatic, neck supple. Eyes: EOMI. Respiratory: breath sounds clear in all fields, no wheezing, rales, or rhonchi. Cardiovascular: Regular rate and rhythm, no murmurs, gallops or rubs.  Abdomen: non-tender to palpation and soft, normoactive bowel sounds. Musculoskeletal: Antalgic gait and walking without any assistive devices.  Left Knee Exam:  Moderate effusion. Range of motion is 5-125 degrees. No crepitus on range of motion of the knee. Significantly lateral joint line tenderness. Mild medial joint line tenderness. Stable knee. Calves soft and nontender. Motor function intact in LE. Strength 5/5 LE bilaterally. Neuro: Distal pulses 2+. Sensation to light touch intact in LE.  Vital signs in last 24 hours: Blood pressure: 130/78 mmHg Pulse: 78 bpm   Labs:   Estimated body mass index is 24.4 kg/m as calculated from the following:   Height as of 12/16/16: 5\' 6"  (1.676 m).   Weight as of 12/16/16: 68.6 kg.   Imaging Review Plain radiographs demonstrate moderate to severe degenerative joint disease of the left knee(s). The overall alignment isneutral. The bone quality appears to be adequate for age and reported activity level.   Preoperative templating of the joint replacement has been completed, documented, and submitted to the Operating Room personnel in order to optimize intra-operative equipment management.   Anticipated LOS equal to or greater than 2 midnights due to - Age 44 and older with one or more of the following:  - Obesity  - Expected need for hospital services (PT, OT,  Nursing) required for safe  discharge  - Anticipated need for postoperative skilled nursing care or inpatient rehab  - Active co-morbidities: History of TIA OR   - Unanticipated findings during/Post Surgery: None  - Patient is a high risk of re-admission due to: None     Assessment/Plan:  End stage arthritis, left knee   The patient history, physical examination, clinical judgment of the provider and imaging studies are consistent with end stage degenerative joint disease of the left knee(s) and total knee arthroplasty is deemed medically necessary. The treatment options including medical management, injection therapy arthroscopy and arthroplasty were discussed at length. The risks and benefits of total knee arthroplasty were presented and reviewed. The risks due to aseptic loosening, infection, stiffness, patella tracking problems, thromboembolic complications and other imponderables were discussed. The patient acknowledged the explanation, agreed to proceed with the plan and consent was signed. Patient is being admitted for inpatient treatment for surgery, pain control, PT, OT, prophylactic antibiotics, VTE prophylaxis, progressive ambulation and ADL's  and discharge planning. The patient is planning to be discharged to skilled nursing facility.  Disposition: SNF Planned DVT Prophylaxis: Xarelto 10 mg daily (hx of TIA x3) DME needed: Gilford Rile, 3-n-1 PCP: Dr. Laurann [facility]) TXA: Topical Allergies: None Other:Hx of TIA (3 separate episodes)  - Patient was instructed on what medications to stop prior to surgery. - Follow-up visit in 2 weeks with Dr. Wynelle Link - Begin physical therapy following surgery - Pre-operative lab work as pre-surgical testing - Prescriptions will be provided in hospital at time of discharge  Theresa Duty, PA-C Orthopedic Surgery [facility]

## 2018-01-19 NOTE — Patient Instructions (Signed)
Diane Chambers  01/19/2018   Your procedure is scheduled on:  01-29-2018  Report to [facility] Main  Entrance  Report to admitting at   10:00 AM    Call this number if you have problems the morning of surgery [phone]   Remember: Do not eat food After Midnight. Only clear liquid diet from midnight to 6:30 AM day of surgery,  After 6:30 AM nothing by mouth including no candy, gum, or mints.     Take these medicines the morning of surgery with A SIP OF WATER:  AMLODIPINE, LEVOTHYROXINE, OMEPRAZOLE                                You may not have any metal on your body including hair pins and              piercings              Do not wear jewelry, make-up, lotions, powders or perfumes, deodorant             Do not wear nail polish.  Do not shave  48 hours prior to surgery.                 Do not bring valuables to the hospital. [place].  Contacts, dentures or bridgework may not be worn into surgery.  Leave suitcase in the car. After surgery it may be brought to your room.   Special Instructions: N/A              Please read over the following fact sheets you were given: _____________________________________________________________________    CLEAR LIQUID DIET   Foods Allowed                                                                     Foods Excluded  Coffee and tea, regular and decaf                             liquids that you cannot  Plain Jell-O in any flavor                                             see through such as: Fruit ices (not with fruit pulp)                                     milk, soups, orange juice  Iced Popsicles                                    All solid food Carbonated beverages, regular and diet  Cranberry, grape and apple juices Sports drinks like Gatorade Lightly seasoned clear broth or consume(fat free) Sugar, honey  syrup  Sample Menu Breakfast                                Lunch                                     Supper Cranberry juice                    Beef broth                            Chicken broth Jell-O                                     Grape juice                           Apple juice Coffee or tea                        Jell-O                                      Popsicle                                                Coffee or tea                        Coffee or tea  _____________________________________________________________________    Incentive Spirometer  An incentive spirometer is a tool that can help keep your lungs clear and active. This tool measures how well you are filling your lungs with each breath. Taking long deep breaths may help reverse or decrease the chance of developing breathing (pulmonary) problems (especially infection) following:  A long period of time when you are unable to move or be active. BEFORE THE PROCEDURE   If the spirometer includes an indicator to show your best effort, your nurse or respiratory therapist will set it to a desired goal.  If possible, sit up straight or lean slightly forward. Try not to slouch.  Hold the incentive spirometer in an upright position. INSTRUCTIONS FOR USE  1. Sit on the edge of your bed if possible, or sit up as far as you can in bed or on a chair. 2. Hold the incentive spirometer in an upright position. 3. Breathe out normally. 4. Place the mouthpiece in your mouth and seal your lips tightly around it. 5. Breathe in slowly and as deeply as possible, raising the piston or the ball toward the top of the column. 6. Hold your breath for 3-5 seconds or for as long as possible. Allow the piston or ball to fall to the bottom of the column. 7. Remove the mouthpiece from your mouth and breathe out normally. 8. Rest for a few seconds and repeat Steps 1 through 7 at  least 10 times every 1-2 hours when you are awake. Take  your time and take a few normal breaths between deep breaths. 9. The spirometer may include an indicator to show your best effort. Use the indicator as a goal to work toward during each repetition. 10. After each set of 10 deep breaths, practice coughing to be sure your lungs are clear. If you have an incision (the cut made at the time of surgery), support your incision when coughing by placing a pillow or rolled up towels firmly against it. Once you are able to get out of bed, walk around indoors and cough well. You may stop using the incentive spirometer when instructed by your caregiver.  RISKS AND COMPLICATIONS  Take your time so you do not get dizzy or light-headed.  If you are in pain, you may need to take or ask for pain medication before doing incentive spirometry. It is harder to take a deep breath if you are having pain. AFTER USE  Rest and breathe slowly and easily.  It can be helpful to keep track of a log of your progress. Your caregiver can provide you with a simple table to help with this. If you are using the spirometer at home, follow these instructions: [place] IF:   You are having difficultly using the spirometer.  You have trouble using the spirometer as often as instructed.  Your pain medication is not giving enough relief while using the spirometer.  You develop fever of 100.5 F (38.1 C) or higher. SEEK IMMEDIATE MEDICAL CARE IF:   You cough up bloody sputum that had not been present before.  You develop fever of 102 F (38.9 C) or greater.  You develop worsening pain at or near the incision site. MAKE SURE YOU:   Understand these instructions.  Will watch your condition.  Will get help right away if you are not doing well or get worse. Document Released: 09/19/2006 Document Revised: 08/01/2011 Document Reviewed: 11/20/2006 ExitCare Patient Information 2014 ExitCare,  [place].   ________________________________________________________________________  WHAT IS A BLOOD TRANSFUSION? Blood Transfusion Information  A transfusion is the replacement of blood or some of its parts. Blood is made up of multiple cells which provide different functions.  Red blood cells carry oxygen and are used for blood loss replacement.  White blood cells fight against infection.  Platelets control bleeding.  Plasma helps clot blood.  Other blood products are available for specialized needs, such as hemophilia or other clotting disorders. BEFORE THE TRANSFUSION  Who gives blood for transfusions?   Healthy volunteers who are fully evaluated to make sure their blood is safe. This is blood bank blood. Transfusion therapy is the safest it has ever been in the practice of medicine. Before blood is taken from a donor, a complete history is taken to make sure that person has no history of diseases nor engages in risky social behavior (examples are intravenous drug use or sexual activity with multiple partners). The donor's travel history is screened to minimize risk of transmitting infections, such as malaria. The donated blood is tested for signs of infectious diseases, such as HIV and hepatitis. The blood is then tested to be sure it is compatible with you in order to minimize the chance of a transfusion reaction. If you or a relative donates blood, this is often done in anticipation of surgery and is not appropriate for emergency situations. It takes many days to process the donated blood. RISKS AND COMPLICATIONS Although transfusion  therapy is very safe and saves many lives, the main dangers of transfusion include:   Getting an infectious disease.  Developing a transfusion reaction. This is an allergic reaction to something in the blood you were given. Every precaution is taken to prevent this. The decision to have a blood transfusion has been considered carefully by your caregiver  before blood is given. Blood is not given unless the benefits outweigh the risks. AFTER THE TRANSFUSION  Right after receiving a blood transfusion, you will usually feel much better and more energetic. This is especially true if your red blood cells have gotten low (anemic). The transfusion raises the level of the red blood cells which carry oxygen, and this usually causes an energy increase.  The nurse administering the transfusion will monitor you carefully for complications. HOME CARE INSTRUCTIONS  No special instructions are needed after a transfusion. You may find your energy is better. Speak with your caregiver about any limitations on activity for underlying diseases you may have. SEEK MEDICAL CARE IF:   Your condition is not improving after your transfusion.  You develop redness or irritation at the intravenous (IV) site. SEEK IMMEDIATE MEDICAL CARE IF:  Any of the following symptoms occur over the next 12 hours:  Shaking chills.  You have a temperature by mouth above 102 F (38.9 C), not controlled by medicine.  Chest, back, or muscle pain.  People around you feel you are not acting correctly or are confused.  Shortness of breath or difficulty breathing.  Dizziness and fainting.  You get a rash or develop hives.  You have a decrease in urine output.  Your urine turns a dark color or changes to pink, red, or brown. Any of the following symptoms occur over the next 10 days:  You have a temperature by mouth above 102 F (38.9 C), not controlled by medicine.  Shortness of breath.  Weakness after normal activity.  The white part of the eye turns yellow (jaundice).  You have a decrease in the amount of urine or are urinating less often.  Your urine turns a dark color or changes to pink, red, or brown. Document Released: 05/06/2000 Document Revised: 08/01/2011 Document Reviewed: 12/24/2007 ExitCare Patient Information 2014 [place].    __________________[facility] - Preparing for Surgery_______________________ Before surgery, you can play an important role.  Because skin is not sterile, your skin needs to be as free of germs as possible.  You can reduce the number of germs on your skin by washing with CHG (chlorahexidine gluconate) soap before surgery.  CHG is an antiseptic cleaner which kills germs and bonds with the skin to continue killing germs even after washing. Please DO NOT use if you have an allergy to CHG or antibacterial soaps.  If your skin becomes reddened/irritated stop using the CHG and inform your nurse when you arrive at Short Stay. Do not shave (including legs and underarms) for at least 48 hours prior to the first CHG shower.  You may shave your face/neck. Please follow these instructions carefully:  1.  Shower with CHG Soap the night before surgery and the  morning of Surgery.  2.  If you choose to wash your hair, wash your hair first as usual with your  normal  shampoo.  3.  After you shampoo, rinse your hair and body thoroughly to remove the  shampoo.  4.  Use CHG as you would any other liquid soap.  You can apply chg directly  to the skin and wash                       Gently with a scrungie or clean washcloth.  5.  Apply the CHG Soap to your body ONLY FROM THE NECK DOWN.   Do not use on face/ open                           Wound or open sores. Avoid contact with eyes, ears mouth and genitals (private parts).                       Wash face,  Genitals (private parts) with your normal soap.             6.  Wash thoroughly, paying special attention to the area where your surgery  will be performed.  7.  Thoroughly rinse your body with warm water from the neck down.  8.  DO NOT shower/wash with your normal soap after using and rinsing off  the CHG Soap.             9.  Pat yourself dry with a clean towel.            10.  Wear clean pajamas.            11.   Place clean sheets on your bed the night of your first shower and do not  sleep with pets. Day of Surgery : Do not apply any lotions/deodorants the morning of surgery.  Please wear clean clothes to the hospital/surgery center.  FAILURE TO FOLLOW THESE INSTRUCTIONS MAY RESULT IN THE CANCELLATION OF YOUR SURGERY PATIENT SIGNATURE_________________________________  NURSE SIGNATURE__________________________________  ________________________________________________________________________

## 2018-01-23 ENCOUNTER — Encounter (HOSPITAL_COMMUNITY)
Admission: RE | Admit: 2018-01-23 | Discharge: 2018-01-23 | Disposition: A | Discharge status: Home / Self Care | Payer: Medicare HMO | Source: Ambulatory Visit | Attending: Orthopedic Surgery | Admitting: Orthopedic Surgery

## 2018-01-23 ENCOUNTER — Encounter (HOSPITAL_COMMUNITY): Payer: Self-pay

## 2018-01-23 ENCOUNTER — Other Ambulatory Visit: Payer: Self-pay

## 2018-01-23 DIAGNOSIS — I10 Essential (primary) hypertension: Secondary | ICD-10-CM | POA: Diagnosis not present

## 2018-01-23 DIAGNOSIS — Z8673 Personal history of transient ischemic attack (TIA), and cerebral infarction without residual deficits: Secondary | ICD-10-CM | POA: Insufficient documentation

## 2018-01-23 DIAGNOSIS — Z01818 Encounter for other preprocedural examination: Secondary | ICD-10-CM | POA: Diagnosis not present

## 2018-01-23 DIAGNOSIS — Z791 Long term (current) use of non-steroidal anti-inflammatories (NSAID): Secondary | ICD-10-CM | POA: Diagnosis not present

## 2018-01-23 DIAGNOSIS — Z7982 Long term (current) use of aspirin: Secondary | ICD-10-CM | POA: Insufficient documentation

## 2018-01-23 DIAGNOSIS — M1712 Unilateral primary osteoarthritis, left knee: Secondary | ICD-10-CM | POA: Diagnosis not present

## 2018-01-23 DIAGNOSIS — Z79890 Hormone replacement therapy (postmenopausal): Secondary | ICD-10-CM | POA: Insufficient documentation

## 2018-01-23 DIAGNOSIS — Z96651 Presence of right artificial knee joint: Secondary | ICD-10-CM | POA: Diagnosis not present

## 2018-01-23 DIAGNOSIS — Z79899 Other long term (current) drug therapy: Secondary | ICD-10-CM | POA: Insufficient documentation

## 2018-01-23 DIAGNOSIS — Z87891 Personal history of nicotine dependence: Secondary | ICD-10-CM | POA: Insufficient documentation

## 2018-01-23 DIAGNOSIS — K219 Gastro-esophageal reflux disease without esophagitis: Secondary | ICD-10-CM | POA: Insufficient documentation

## 2018-01-23 DIAGNOSIS — Z01810 Encounter for preprocedural cardiovascular examination: Secondary | ICD-10-CM | POA: Diagnosis not present

## 2018-01-23 HISTORY — DX: Diaphragmatic hernia without obstruction or gangrene: K44.9

## 2018-01-23 HISTORY — DX: Unspecified symptoms and signs involving the genitourinary system: R39.9

## 2018-01-23 HISTORY — DX: Occlusion and stenosis of bilateral carotid arteries: I65.23

## 2018-01-23 HISTORY — DX: Personal history of other specified conditions: Z87.898

## 2018-01-23 HISTORY — DX: Unspecified osteoarthritis, unspecified site: M19.90

## 2018-01-23 HISTORY — DX: Personal history of transient ischemic attack (TIA), and cerebral infarction without residual deficits: Z86.73

## 2018-01-23 HISTORY — DX: Dry eye syndrome of unspecified lacrimal gland: H04.129

## 2018-01-23 HISTORY — DX: Other specified disorders of veins: I87.8

## 2018-01-23 HISTORY — DX: Hypothyroidism, unspecified: E03.9

## 2018-01-23 HISTORY — DX: Presence of spectacles and contact lenses: Z97.3

## 2018-01-23 HISTORY — DX: Presence of external hearing-aid: Z97.4

## 2018-01-23 HISTORY — DX: Personal history of other diseases of the digestive system: Z87.19

## 2018-01-23 HISTORY — DX: Restless legs syndrome: G25.81

## 2018-01-23 HISTORY — DX: Exudative age-related macular degeneration, right eye, stage unspecified: H35.3210

## 2018-01-23 LAB — CBC
HEMATOCRIT: 40 % (ref 36.0–46.0)
HEMOGLOBIN: 13.3 g/dL (ref 12.0–15.0)
MCH: 31.8 pg (ref 26.0–34.0)
MCHC: 33.3 g/dL (ref 30.0–36.0)
MCV: 95.7 fL (ref 78.0–100.0)
Platelets: 419 10*3/uL — ABNORMAL HIGH (ref 150–400)
RBC: 4.18 MIL/uL (ref 3.87–5.11)
RDW: 14.3 % (ref 11.5–15.5)
WBC: 6.9 10*3/uL (ref 4.0–10.5)

## 2018-01-23 LAB — COMPREHENSIVE METABOLIC PANEL
ALK PHOS: 109 U/L (ref 38–126)
ALT: 16 U/L (ref 0–44)
ANION GAP: 9 (ref 5–15)
AST: 22 U/L (ref 15–41)
Albumin: 4.3 g/dL (ref 3.5–5.0)
BILIRUBIN TOTAL: 1 mg/dL (ref 0.3–1.2)
BUN: 12 mg/dL (ref 8–23)
CO2: 28 mmol/L (ref 22–32)
Calcium: 9.6 mg/dL (ref 8.9–10.3)
Chloride: 100 mmol/L (ref 98–111)
Creatinine, Ser: 0.66 mg/dL (ref 0.44–1.00)
GFR calc non Af Amer: >60 mL/min (ref >60)
Glucose, Bld: 126 mg/dL — ABNORMAL HIGH (ref 70–99)
POTASSIUM: 4.3 mmol/L (ref 3.5–5.1)
Sodium: 137 mmol/L (ref 135–145)
TOTAL PROTEIN: 7 g/dL (ref 6.5–8.1)

## 2018-01-23 LAB — SURGICAL PCR SCREEN
MRSA, PCR: NEGATIVE
Staphylococcus aureus: NEGATIVE

## 2018-01-23 LAB — PROTIME-INR
INR: 0.89
Prothrombin Time: 11.9 seconds (ref 11.4–15.2)

## 2018-01-23 LAB — ABO/RH: ABO/RH(D): O POS

## 2018-01-23 LAB — APTT: aPTT: 30 seconds (ref 24–36)

## 2018-01-23 NOTE — Progress Notes (Addendum)
Pt pcp clearance, dr Kelton Pillar, dated 01-17-2018 with chart. Also, cardiac clearance with office visit note dated 05-19-2017 with chart, dr Einar Gip (with stress test and echo results in note).  ADDENDUM:  Final EKG dated 01-23-2018 in epic.

## 2018-01-24 DIAGNOSIS — T148XXA Other injury of unspecified body region, initial encounter: Secondary | ICD-10-CM

## 2018-01-24 HISTORY — DX: Other injury of unspecified body region, initial encounter: T14.8XXA

## 2018-01-26 DIAGNOSIS — M79671 Pain in right foot: Secondary | ICD-10-CM | POA: Diagnosis not present

## 2018-01-26 DIAGNOSIS — S92354A Nondisplaced fracture of fifth metatarsal bone, right foot, initial encounter for closed fracture: Secondary | ICD-10-CM | POA: Diagnosis not present

## 2018-01-28 MED ORDER — TRANEXAMIC ACID 1000 MG/10ML IV SOLN
2000.0000 mg | INTRAVENOUS | Status: DC
  Filled 2018-01-28: qty 20

## 2018-01-28 MED ORDER — BUPIVACAINE LIPOSOME 1.3 % IJ SUSP
20.0000 mL | Freq: Once | INTRAMUSCULAR | Status: DC
  Filled 2018-01-28: qty 20

## 2018-01-29 ENCOUNTER — Encounter (HOSPITAL_COMMUNITY): Payer: Self-pay | Admitting: Emergency Medicine

## 2018-01-29 ENCOUNTER — Other Ambulatory Visit: Payer: Self-pay

## 2018-01-29 ENCOUNTER — Inpatient Hospital Stay (HOSPITAL_COMMUNITY)
Admission: RE | Admit: 2018-01-29 | Discharge: 2018-01-31 | DRG: 470 | Disposition: A | Discharge status: Skilled Nursing Facility | Payer: Medicare HMO | Attending: Orthopedic Surgery | Admitting: Orthopedic Surgery

## 2018-01-29 ENCOUNTER — Encounter (HOSPITAL_COMMUNITY)
Admission: RE | Disposition: A | Discharge status: Skilled Nursing Facility | Payer: Self-pay | Source: Home / Self Care | Attending: Orthopedic Surgery

## 2018-01-29 ENCOUNTER — Inpatient Hospital Stay (HOSPITAL_COMMUNITY): Payer: Medicare HMO | Admitting: Certified Registered Nurse Anesthetist

## 2018-01-29 DIAGNOSIS — Z888 Allergy status to other drugs, medicaments and biological substances status: Secondary | ICD-10-CM

## 2018-01-29 DIAGNOSIS — Z9049 Acquired absence of other specified parts of digestive tract: Secondary | ICD-10-CM

## 2018-01-29 DIAGNOSIS — E039 Hypothyroidism, unspecified: Secondary | ICD-10-CM | POA: Diagnosis not present

## 2018-01-29 DIAGNOSIS — Z8673 Personal history of transient ischemic attack (TIA), and cerebral infarction without residual deficits: Secondary | ICD-10-CM

## 2018-01-29 DIAGNOSIS — Z8249 Family history of ischemic heart disease and other diseases of the circulatory system: Secondary | ICD-10-CM

## 2018-01-29 DIAGNOSIS — Z96652 Presence of left artificial knee joint: Secondary | ICD-10-CM | POA: Diagnosis not present

## 2018-01-29 DIAGNOSIS — Z974 Presence of external hearing-aid: Secondary | ICD-10-CM

## 2018-01-29 DIAGNOSIS — K219 Gastro-esophageal reflux disease without esophagitis: Secondary | ICD-10-CM | POA: Diagnosis present

## 2018-01-29 DIAGNOSIS — Z7982 Long term (current) use of aspirin: Secondary | ICD-10-CM | POA: Diagnosis not present

## 2018-01-29 DIAGNOSIS — I2510 Atherosclerotic heart disease of native coronary artery without angina pectoris: Secondary | ICD-10-CM | POA: Diagnosis not present

## 2018-01-29 DIAGNOSIS — I6523 Occlusion and stenosis of bilateral carotid arteries: Secondary | ICD-10-CM | POA: Diagnosis not present

## 2018-01-29 DIAGNOSIS — M2550 Pain in unspecified joint: Secondary | ICD-10-CM | POA: Diagnosis not present

## 2018-01-29 DIAGNOSIS — Z79899 Other long term (current) drug therapy: Secondary | ICD-10-CM

## 2018-01-29 DIAGNOSIS — Z833 Family history of diabetes mellitus: Secondary | ICD-10-CM

## 2018-01-29 DIAGNOSIS — M179 Osteoarthritis of knee, unspecified: Secondary | ICD-10-CM

## 2018-01-29 DIAGNOSIS — I878 Other specified disorders of veins: Secondary | ICD-10-CM | POA: Diagnosis not present

## 2018-01-29 DIAGNOSIS — Z8349 Family history of other endocrine, nutritional and metabolic diseases: Secondary | ICD-10-CM | POA: Diagnosis not present

## 2018-01-29 DIAGNOSIS — M1710 Unilateral primary osteoarthritis, unspecified knee: Secondary | ICD-10-CM | POA: Diagnosis not present

## 2018-01-29 DIAGNOSIS — I10 Essential (primary) hypertension: Secondary | ICD-10-CM | POA: Diagnosis not present

## 2018-01-29 DIAGNOSIS — Z96651 Presence of right artificial knee joint: Secondary | ICD-10-CM | POA: Diagnosis present

## 2018-01-29 DIAGNOSIS — T402X5A Adverse effect of other opioids, initial encounter: Secondary | ICD-10-CM | POA: Diagnosis not present

## 2018-01-29 DIAGNOSIS — Z87891 Personal history of nicotine dependence: Secondary | ICD-10-CM

## 2018-01-29 DIAGNOSIS — M6281 Muscle weakness (generalized): Secondary | ICD-10-CM | POA: Diagnosis not present

## 2018-01-29 DIAGNOSIS — M1712 Unilateral primary osteoarthritis, left knee: Secondary | ICD-10-CM | POA: Diagnosis not present

## 2018-01-29 DIAGNOSIS — M25562 Pain in left knee: Secondary | ICD-10-CM | POA: Diagnosis present

## 2018-01-29 DIAGNOSIS — Z79890 Hormone replacement therapy (postmenopausal): Secondary | ICD-10-CM

## 2018-01-29 DIAGNOSIS — G8918 Other acute postprocedural pain: Secondary | ICD-10-CM | POA: Diagnosis not present

## 2018-01-29 DIAGNOSIS — K5909 Other constipation: Secondary | ICD-10-CM | POA: Diagnosis not present

## 2018-01-29 DIAGNOSIS — Z973 Presence of spectacles and contact lenses: Secondary | ICD-10-CM

## 2018-01-29 DIAGNOSIS — H353210 Exudative age-related macular degeneration, right eye, stage unspecified: Secondary | ICD-10-CM | POA: Diagnosis present

## 2018-01-29 DIAGNOSIS — R2689 Other abnormalities of gait and mobility: Secondary | ICD-10-CM | POA: Diagnosis not present

## 2018-01-29 DIAGNOSIS — Z791 Long term (current) use of non-steroidal anti-inflammatories (NSAID): Secondary | ICD-10-CM | POA: Diagnosis not present

## 2018-01-29 DIAGNOSIS — S92351D Displaced fracture of fifth metatarsal bone, right foot, subsequent encounter for fracture with routine healing: Secondary | ICD-10-CM | POA: Diagnosis not present

## 2018-01-29 HISTORY — PX: TOTAL KNEE ARTHROPLASTY: SHX125

## 2018-01-29 HISTORY — DX: Other injury of unspecified body region, initial encounter: T14.8XXA

## 2018-01-29 LAB — TYPE AND SCREEN
ABO/RH(D): O POS
Antibody Screen: NEGATIVE

## 2018-01-29 SURGERY — ARTHROPLASTY, KNEE, TOTAL
Anesthesia: Spinal | Site: Knee | Laterality: Left

## 2018-01-29 MED ORDER — PANTOPRAZOLE SODIUM 40 MG PO TBEC
40.0000 mg | DELAYED_RELEASE_TABLET | Freq: Two times a day (BID) | ORAL | Status: DC
  Administered 2018-01-29 – 2018-01-31 (×5): 40 mg via ORAL
  Filled 2018-01-29 (×6): qty 1

## 2018-01-29 MED ORDER — CHLORHEXIDINE GLUCONATE 4 % EX LIQD: 60.0000 mL | Freq: Once | CUTANEOUS | Status: DC

## 2018-01-29 MED ORDER — HYDROMORPHONE HCL 2 MG PO TABS: 2.0000 mg | ORAL_TABLET | ORAL | Status: DC | PRN

## 2018-01-29 MED ORDER — METOCLOPRAMIDE HCL 5 MG/ML IJ SOLN: 5.0000 mg | Freq: Three times a day (TID) | INTRAMUSCULAR | Status: DC | PRN

## 2018-01-29 MED ORDER — ONDANSETRON HCL 4 MG/2ML IJ SOLN: 4.0000 mg | Freq: Four times a day (QID) | INTRAMUSCULAR | Status: DC | PRN

## 2018-01-29 MED ORDER — SERTRALINE HCL 50 MG PO TABS
50.0000 mg | ORAL_TABLET | Freq: Every day | ORAL | Status: DC
  Administered 2018-01-29 – 2018-01-30 (×2): 50 mg via ORAL
  Filled 2018-01-29 (×2): qty 1

## 2018-01-29 MED ORDER — MIDAZOLAM HCL 2 MG/2ML IJ SOLN
1.0000 mg | INTRAMUSCULAR | Status: DC
  Filled 2018-01-29: qty 2

## 2018-01-29 MED ORDER — LACTATED RINGERS IV SOLN
INTRAVENOUS | Status: DC
  Administered 2018-01-29: 11:00:00 via INTRAVENOUS

## 2018-01-29 MED ORDER — PROPOFOL 10 MG/ML IV BOLUS
INTRAVENOUS | Status: DC | PRN
  Administered 2018-01-29 (×3): 20 mg via INTRAVENOUS

## 2018-01-29 MED ORDER — PHENYLEPHRINE 40 MCG/ML (10ML) SYRINGE FOR IV PUSH (FOR BLOOD PRESSURE SUPPORT)
PREFILLED_SYRINGE | INTRAVENOUS | Status: AC
  Filled 2018-01-29: qty 10

## 2018-01-29 MED ORDER — METHOCARBAMOL 500 MG IVPB - SIMPLE MED
500.0000 mg | Freq: Four times a day (QID) | INTRAVENOUS | Status: DC | PRN
  Filled 2018-01-29: qty 50

## 2018-01-29 MED ORDER — DEXAMETHASONE SODIUM PHOSPHATE 10 MG/ML IJ SOLN
10.0000 mg | Freq: Once | INTRAMUSCULAR | Status: AC
  Administered 2018-01-30: 10 mg via INTRAVENOUS
  Filled 2018-01-29: qty 1

## 2018-01-29 MED ORDER — PHENYLEPHRINE 40 MCG/ML (10ML) SYRINGE FOR IV PUSH (FOR BLOOD PRESSURE SUPPORT)
PREFILLED_SYRINGE | INTRAVENOUS | Status: DC | PRN
  Administered 2018-01-29 (×3): 80 ug via INTRAVENOUS

## 2018-01-29 MED ORDER — SODIUM CHLORIDE 0.9 % IV SOLN
INTRAVENOUS | Status: DC | PRN
  Administered 2018-01-29: 40 ug/min via INTRAVENOUS

## 2018-01-29 MED ORDER — CEFAZOLIN SODIUM-DEXTROSE 2-4 GM/100ML-% IV SOLN
2.0000 g | Freq: Four times a day (QID) | INTRAVENOUS | Status: AC
  Administered 2018-01-29 – 2018-01-30 (×2): 2 g via INTRAVENOUS
  Filled 2018-01-29 (×2): qty 100

## 2018-01-29 MED ORDER — PHENOL 1.4 % MT LIQD: 1.0000 | OROMUCOSAL | Status: DC | PRN

## 2018-01-29 MED ORDER — SODIUM CHLORIDE 0.9 % IJ SOLN
INTRAMUSCULAR | Status: AC
  Filled 2018-01-29: qty 10

## 2018-01-29 MED ORDER — ONDANSETRON HCL 4 MG/2ML IJ SOLN
INTRAMUSCULAR | Status: AC
  Filled 2018-01-29: qty 2

## 2018-01-29 MED ORDER — ASPIRIN EC 325 MG PO TBEC
325.0000 mg | DELAYED_RELEASE_TABLET | Freq: Two times a day (BID) | ORAL | Status: DC
  Administered 2018-01-30 – 2018-01-31 (×3): 325 mg via ORAL
  Filled 2018-01-29 (×3): qty 1

## 2018-01-29 MED ORDER — FLEET ENEMA 7-19 GM/118ML RE ENEM: 1.0000 | ENEMA | Freq: Once | RECTAL | Status: DC | PRN

## 2018-01-29 MED ORDER — OXYCODONE HCL 5 MG PO TABS
10.0000 mg | ORAL_TABLET | ORAL | Status: DC | PRN
  Administered 2018-01-29 – 2018-01-30 (×4): 15 mg via ORAL
  Filled 2018-01-29 (×4): qty 3

## 2018-01-29 MED ORDER — SODIUM CHLORIDE 0.9 % IV SOLN
INTRAVENOUS | Status: DC
  Administered 2018-01-29: 16:00:00 via INTRAVENOUS

## 2018-01-29 MED ORDER — DIPHENHYDRAMINE HCL 12.5 MG/5ML PO ELIX
12.5000 mg | ORAL_SOLUTION | ORAL | Status: DC | PRN
  Administered 2018-01-30: 12.5 mg via ORAL
  Administered 2018-01-30: 25 mg via ORAL
  Administered 2018-01-31: 12.5 mg via ORAL
  Filled 2018-01-29: qty 5
  Filled 2018-01-29: qty 10
  Filled 2018-01-29: qty 5

## 2018-01-29 MED ORDER — ACETAMINOPHEN 500 MG PO TABS
1000.0000 mg | ORAL_TABLET | Freq: Four times a day (QID) | ORAL | Status: AC
  Administered 2018-01-29 – 2018-01-30 (×4): 1000 mg via ORAL
  Filled 2018-01-29 (×4): qty 2

## 2018-01-29 MED ORDER — PROPOFOL 10 MG/ML IV BOLUS
INTRAVENOUS | Status: AC
  Filled 2018-01-29: qty 20

## 2018-01-29 MED ORDER — STERILE WATER FOR IRRIGATION IR SOLN
Status: DC | PRN
  Administered 2018-01-29: 2000 mL

## 2018-01-29 MED ORDER — SODIUM CHLORIDE 0.9 % IJ SOLN
INTRAMUSCULAR | Status: DC | PRN
  Administered 2018-01-29: 60 mL

## 2018-01-29 MED ORDER — ACETAMINOPHEN 10 MG/ML IV SOLN
1000.0000 mg | Freq: Four times a day (QID) | INTRAVENOUS | Status: DC
  Administered 2018-01-29: 1000 mg via INTRAVENOUS
  Filled 2018-01-29: qty 100

## 2018-01-29 MED ORDER — ATORVASTATIN CALCIUM 20 MG PO TABS
20.0000 mg | ORAL_TABLET | Freq: Every day | ORAL | Status: DC
  Administered 2018-01-29 – 2018-01-30 (×2): 20 mg via ORAL
  Filled 2018-01-29 (×2): qty 1

## 2018-01-29 MED ORDER — PROPOFOL 10 MG/ML IV BOLUS
INTRAVENOUS | Status: AC
  Filled 2018-01-29: qty 60

## 2018-01-29 MED ORDER — POLYETHYLENE GLYCOL 3350 17 G PO PACK: 17.0000 g | PACK | Freq: Every day | ORAL | Status: DC | PRN

## 2018-01-29 MED ORDER — 0.9 % SODIUM CHLORIDE (POUR BTL) OPTIME
TOPICAL | Status: DC | PRN
  Administered 2018-01-29: 1000 mL

## 2018-01-29 MED ORDER — LEVOTHYROXINE SODIUM 112 MCG PO TABS
112.0000 ug | ORAL_TABLET | Freq: Every day | ORAL | Status: DC
  Administered 2018-01-30 – 2018-01-31 (×2): 112 ug via ORAL
  Filled 2018-01-29 (×2): qty 1

## 2018-01-29 MED ORDER — SODIUM CHLORIDE 0.9 % IR SOLN
Status: DC | PRN
  Administered 2018-01-29: 1000 mL

## 2018-01-29 MED ORDER — DEXAMETHASONE SODIUM PHOSPHATE 10 MG/ML IJ SOLN
8.0000 mg | Freq: Once | INTRAMUSCULAR | Status: AC
  Administered 2018-01-29: 8 mg via INTRAVENOUS

## 2018-01-29 MED ORDER — ZOLPIDEM TARTRATE 5 MG PO TABS
5.0000 mg | ORAL_TABLET | Freq: Every evening | ORAL | Status: DC | PRN
  Administered 2018-01-29: 5 mg via ORAL
  Filled 2018-01-29: qty 1

## 2018-01-29 MED ORDER — ROPIVACAINE HCL 7.5 MG/ML IJ SOLN
INTRAMUSCULAR | Status: DC | PRN
  Administered 2018-01-29: 20 mL via PERINEURAL

## 2018-01-29 MED ORDER — PROPOFOL 500 MG/50ML IV EMUL
INTRAVENOUS | Status: DC | PRN
  Administered 2018-01-29: 75 ug/kg/min via INTRAVENOUS

## 2018-01-29 MED ORDER — DEXAMETHASONE SODIUM PHOSPHATE 10 MG/ML IJ SOLN
INTRAMUSCULAR | Status: AC
  Filled 2018-01-29: qty 1

## 2018-01-29 MED ORDER — TRANEXAMIC ACID 1000 MG/10ML IV SOLN
INTRAVENOUS | Status: DC | PRN
  Administered 2018-01-29: 2000 mg via TOPICAL

## 2018-01-29 MED ORDER — MENTHOL 3 MG MT LOZG: 1.0000 | LOZENGE | OROMUCOSAL | Status: DC | PRN

## 2018-01-29 MED ORDER — METHOCARBAMOL 500 MG PO TABS
500.0000 mg | ORAL_TABLET | Freq: Four times a day (QID) | ORAL | Status: DC | PRN
  Administered 2018-01-29 – 2018-01-31 (×5): 500 mg via ORAL
  Filled 2018-01-29 (×5): qty 1

## 2018-01-29 MED ORDER — SODIUM CHLORIDE 0.9 % IJ SOLN
INTRAMUSCULAR | Status: AC
  Filled 2018-01-29: qty 50

## 2018-01-29 MED ORDER — TRAZODONE HCL 50 MG PO TABS
50.0000 mg | ORAL_TABLET | Freq: Every day | ORAL | Status: DC
  Administered 2018-01-29 – 2018-01-30 (×2): 50 mg via ORAL
  Filled 2018-01-29 (×2): qty 1

## 2018-01-29 MED ORDER — BUPIVACAINE HCL (PF) 0.75 % IJ SOLN
INTRAMUSCULAR | Status: DC | PRN
  Administered 2018-01-29: 1.6 mL via INTRATHECAL

## 2018-01-29 MED ORDER — DOCUSATE SODIUM 100 MG PO CAPS
100.0000 mg | ORAL_CAPSULE | Freq: Two times a day (BID) | ORAL | Status: DC
  Administered 2018-01-29 – 2018-01-31 (×4): 100 mg via ORAL
  Filled 2018-01-29 (×4): qty 1

## 2018-01-29 MED ORDER — BUPIVACAINE LIPOSOME 1.3 % IJ SUSP
INTRAMUSCULAR | Status: DC | PRN
  Administered 2018-01-29: 20 mL

## 2018-01-29 MED ORDER — LOSARTAN POTASSIUM 50 MG PO TABS
100.0000 mg | ORAL_TABLET | Freq: Every morning | ORAL | Status: DC
  Administered 2018-01-30 – 2018-01-31 (×2): 100 mg via ORAL
  Filled 2018-01-29 (×2): qty 2

## 2018-01-29 MED ORDER — HYDROMORPHONE HCL 1 MG/ML IJ SOLN: 0.2500 mg | INTRAMUSCULAR | Status: DC | PRN

## 2018-01-29 MED ORDER — OXYCODONE HCL 5 MG PO TABS
5.0000 mg | ORAL_TABLET | ORAL | Status: DC | PRN
  Administered 2018-01-29: 5 mg via ORAL
  Filled 2018-01-29: qty 1

## 2018-01-29 MED ORDER — PROMETHAZINE HCL 25 MG/ML IJ SOLN: 6.2500 mg | INTRAMUSCULAR | Status: DC | PRN

## 2018-01-29 MED ORDER — AMLODIPINE BESYLATE 5 MG PO TABS
5.0000 mg | ORAL_TABLET | Freq: Every morning | ORAL | Status: DC
  Administered 2018-01-30 – 2018-01-31 (×2): 5 mg via ORAL
  Filled 2018-01-29 (×2): qty 1

## 2018-01-29 MED ORDER — CEFAZOLIN SODIUM-DEXTROSE 2-4 GM/100ML-% IV SOLN
2.0000 g | INTRAVENOUS | Status: AC
  Administered 2018-01-29: 2 g via INTRAVENOUS
  Filled 2018-01-29: qty 100

## 2018-01-29 MED ORDER — BISACODYL 10 MG RE SUPP: 10.0000 mg | Freq: Every day | RECTAL | Status: DC | PRN

## 2018-01-29 MED ORDER — HYDROMORPHONE HCL 1 MG/ML IJ SOLN
0.5000 mg | INTRAMUSCULAR | Status: DC | PRN
  Administered 2018-01-29: 1 mg via INTRAVENOUS
  Filled 2018-01-29: qty 1

## 2018-01-29 MED ORDER — ONDANSETRON HCL 4 MG PO TABS
4.0000 mg | ORAL_TABLET | Freq: Four times a day (QID) | ORAL | Status: DC | PRN
  Administered 2018-01-31: 4 mg via ORAL
  Filled 2018-01-29: qty 1

## 2018-01-29 MED ORDER — CLONIDINE HCL (ANALGESIA) 100 MCG/ML EP SOLN
EPIDURAL | Status: DC | PRN
  Administered 2018-01-29: 50 ug

## 2018-01-29 MED ORDER — METOCLOPRAMIDE HCL 5 MG PO TABS: 5.0000 mg | ORAL_TABLET | Freq: Three times a day (TID) | ORAL | Status: DC | PRN

## 2018-01-29 MED ORDER — FENTANYL CITRATE (PF) 100 MCG/2ML IJ SOLN
50.0000 ug | INTRAMUSCULAR | Status: DC
  Administered 2018-01-29: 25 ug via INTRAVENOUS
  Administered 2018-01-29: 75 ug via INTRAVENOUS
  Filled 2018-01-29: qty 2

## 2018-01-29 SURGICAL SUPPLY — 56 items
BAG ZIPLOCK 12X15 (MISCELLANEOUS) ×3 IMPLANT
BANDAGE ACE 6X5 VEL STRL LF (GAUZE/BANDAGES/DRESSINGS) ×3 IMPLANT
BLADE SAG 18X100X1.27 (BLADE) ×3 IMPLANT
BLADE SAW SGTL 11.0X1.19X90.0M (BLADE) ×3 IMPLANT
BOWL SMART MIX CTS (DISPOSABLE) ×3 IMPLANT
CEMENT HV SMART SET (Cement) ×6 IMPLANT
CEMENT TIBIA MBT (Knees) ×1 IMPLANT
CLOSURE WOUND 1/2 X4 (GAUZE/BANDAGES/DRESSINGS) ×2
COVER SURGICAL LIGHT HANDLE (MISCELLANEOUS) ×3 IMPLANT
CUFF TOURN SGL QUICK 34 (TOURNIQUET CUFF) ×2
CUFF TRNQT CYL 34X4X40X1 (TOURNIQUET CUFF) ×1 IMPLANT
DECANTER SPIKE VIAL GLASS SM (MISCELLANEOUS) ×3 IMPLANT
DRAPE U-SHAPE 47X51 STRL (DRAPES) ×3 IMPLANT
DRSG ADAPTIC 3X8 NADH LF (GAUZE/BANDAGES/DRESSINGS) ×3 IMPLANT
DRSG EMULSION OIL 3X16 NADH (GAUZE/BANDAGES/DRESSINGS) ×3 IMPLANT
DRSG PAD ABDOMINAL 8X10 ST (GAUZE/BANDAGES/DRESSINGS) ×3 IMPLANT
DURAPREP 26ML APPLICATOR (WOUND CARE) ×3 IMPLANT
ELECT REM PT RETURN 15FT ADLT (MISCELLANEOUS) ×3 IMPLANT
EVACUATOR 1/8 PVC DRAIN (DRAIN) ×3 IMPLANT
FEMUR SIGMA PS KNEE SZ 4.0N L (Femur) ×3 IMPLANT
GAUZE SPONGE 4X4 12PLY STRL (GAUZE/BANDAGES/DRESSINGS) ×3 IMPLANT
GLOVE BIO SURGEON STRL SZ7 (GLOVE) ×3 IMPLANT
GLOVE BIO SURGEON STRL SZ8 (GLOVE) ×6 IMPLANT
GLOVE BIOGEL PI IND STRL 6.5 (GLOVE) ×1 IMPLANT
GLOVE BIOGEL PI IND STRL 7.0 (GLOVE) ×2 IMPLANT
GLOVE BIOGEL PI IND STRL 8 (GLOVE) ×1 IMPLANT
GLOVE BIOGEL PI INDICATOR 6.5 (GLOVE) ×2
GLOVE BIOGEL PI INDICATOR 7.0 (GLOVE) ×4
GLOVE BIOGEL PI INDICATOR 8 (GLOVE) ×2
GLOVE SURG SS PI 6.5 STRL IVOR (GLOVE) ×3 IMPLANT
GOWN STRL REUS W/TWL LRG LVL3 (GOWN DISPOSABLE) ×6 IMPLANT
HANDPIECE INTERPULSE COAX TIP (DISPOSABLE) ×2
HOLDER FOLEY CATH W/STRAP (MISCELLANEOUS) IMPLANT
IMMOBILIZER KNEE 20 (SOFTGOODS) ×6 IMPLANT
IMMOBILIZER KNEE 20 THIGH 36 (SOFTGOODS) ×1 IMPLANT
MANIFOLD NEPTUNE II (INSTRUMENTS) ×3 IMPLANT
NS IRRIG 1000ML POUR BTL (IV SOLUTION) ×3 IMPLANT
PACK TOTAL KNEE CUSTOM (KITS) ×3 IMPLANT
PAD ABD 8X10 STRL (GAUZE/BANDAGES/DRESSINGS) ×3 IMPLANT
PADDING CAST COTTON 6X4 STRL (CAST SUPPLIES) ×6 IMPLANT
PATELLA DOME PFC 35MM (Knees) ×3 IMPLANT
PIN STEINMAN FIXATION KNEE (PIN) ×3 IMPLANT
PLATE ROT INSERT 10MM SIZE 4 (Plate) ×3 IMPLANT
POSITIONER SURGICAL ARM (MISCELLANEOUS) ×3 IMPLANT
SET HNDPC FAN SPRY TIP SCT (DISPOSABLE) ×1 IMPLANT
STRIP CLOSURE SKIN 1/2X4 (GAUZE/BANDAGES/DRESSINGS) ×4 IMPLANT
SUT MNCRL AB 4-0 PS2 18 (SUTURE) ×3 IMPLANT
SUT STRATAFIX 0 PDS 27 VIOLET (SUTURE) ×3
SUT VIC AB 2-0 CT1 27 (SUTURE) ×6
SUT VIC AB 2-0 CT1 TAPERPNT 27 (SUTURE) ×3 IMPLANT
SUTURE STRATFX 0 PDS 27 VIOLET (SUTURE) ×1 IMPLANT
TIBIA MBT CEMENT (Knees) ×3 IMPLANT
TRAY FOLEY MTR SLVR 16FR STAT (SET/KITS/TRAYS/PACK) ×3 IMPLANT
WATER STERILE IRR 1000ML POUR (IV SOLUTION) ×6 IMPLANT
WRAP KNEE MAXI GEL POST OP (GAUZE/BANDAGES/DRESSINGS) ×3 IMPLANT
YANKAUER SUCT BULB TIP 10FT TU (MISCELLANEOUS) ×3 IMPLANT

## 2018-01-29 NOTE — Op Note (Signed)
OPERATIVE REPORT-TOTAL KNEE ARTHROPLASTY   Pre-operative diagnosis- Osteoarthritis  Left knee(s)  Post-operative diagnosis- Osteoarthritis Left knee(s)  Procedure-  Left  Total Knee Arthroplasty  Surgeon- Dione Plover. Trent Gabler, MD  Assistant- Ardeen Jourdain, PA-C   Anesthesia-  Adductor canal block and spinal  EBL-10 mL   Drains Hemovac  Tourniquet time-  Total Tourniquet Time Documented: Thigh (Left) - 30 minutes Total: Thigh (Left) - 30 minutes     Complications- None  Condition-PACU - hemodynamically stable.   Brief Clinical Note  Diane Chambers is a 84 y.o. year old female with end stage OA of her left knee with progressively worsening pain and dysfunction. She has constant pain, with activity and at rest and significant functional deficits with difficulties even with ADLs. She has had extensive non-op management including analgesics, injections of cortisone and viscosupplements, and home exercise program, but remains in significant pain with significant dysfunction. Radiographs show bone on bone arthritis lateral and patellofemoral. She presents now for left Total Knee Arthroplasty.    Procedure in detail---   The patient is brought into the operating room and positioned supine on the operating table. After successful administration of  Adductor canal block and spinal,   a tourniquet is placed high on the  Left thigh(s) and the lower extremity is prepped and draped in the usual sterile fashion. Time out is performed by the operating team and then the  Left lower extremity is wrapped in Esmarch, knee flexed and the tourniquet inflated to 300 mmHg.       A midline incision is made with a ten blade through the subcutaneous tissue to the level of the extensor mechanism. A fresh blade is used to make a medial parapatellar arthrotomy. Soft tissue over the proximal medial tibia is subperiosteally elevated to the joint line with a knife and into the semimembranosus bursa with a Cobb  elevator. Soft tissue over the proximal lateral tibia is elevated with attention being paid to avoiding the patellar tendon on the tibial tubercle. The patella is everted, knee flexed 90 degrees and the ACL and PCL are removed. Findings are bone on bone lateral and patellofemoral with lateral osteophytes.        The drill is used to create a starting hole in the distal femur and the canal is thoroughly irrigated with sterile saline to remove the fatty contents. The 5 degree Left  valgus alignment guide is placed into the femoral canal and the distal femoral cutting block is pinned to remove 10 mm off the distal femur. Resection is made with an oscillating saw.      The tibia is subluxed forward and the menisci are removed. The extramedullary alignment guide is placed referencing proximally at the medial aspect of the tibial tubercle and distally along the second metatarsal axis and tibial crest. The block is pinned to remove 57mm off the more deficient lateral  side. Resection is made with an oscillating saw. Size 3is the most appropriate size for the tibia and the proximal tibia is prepared with the modular drill and keel punch for that size.      The femoral sizing guide is placed and size 4 is most appropriate. Rotation is marked off the epicondylar axis and confirmed by creating a rectangular flexion gap at 90 degrees. The size 4 cutting block is pinned in this rotation and the anterior, posterior and chamfer cuts are made with the oscillating saw. The intercondylar block is then placed and that cut is made.  Trial size 3 tibial component, trial size 4 narrow posterior stabilized femur and a 10  mm posterior stabilized rotating platform insert trial is placed. Full extension is achieved with excellent varus/valgus and anterior/posterior balance throughout full range of motion. The patella is everted and thickness measured to be 22  mm. Free hand resection is taken to 12 mm, a 35 template is placed, lug  holes are drilled, trial patella is placed, and it tracks normally. Osteophytes are removed off the posterior femur with the trial in place. All trials are removed and the cut bone surfaces prepared with pulsatile lavage. Cement is mixed and once ready for implantation, the size 3 tibial implant, size  4 narrow posterior stabilized femoral component, and the size 35 patella are cemented in place and the patella is held with the clamp. The trial insert is placed and the knee held in full extension. The Exparel (20 ml mixed with 60 ml saline) is injected into the extensor mechanism, posterior capsule, medial and lateral gutters and subcutaneous tissues.  All extruded cement is removed and once the cement is hard the permanent 10 mm posterior stabilized rotating platform insert is placed into the tibial tray.      The wound is copiously irrigated with saline solution and the extensor mechanism closed over a hemovac drain with #1 V-loc suture. The tourniquet is released for a total tourniquet time of 30  minutes. Flexion against gravity is 140 degrees and the patella tracks normally. Subcutaneous tissue is closed with 2.0 vicryl and subcuticular with running 4.0 Monocryl. The incision is cleaned and dried and steri-strips and a bulky sterile dressing are applied. The limb is placed into a knee immobilizer and the patient is awakened and transported to recovery in stable condition.      Please note that a surgical assistant was a medical necessity for this procedure in order to perform it in a safe and expeditious manner. Surgical assistant was necessary to retract the ligaments and vital neurovascular structures to prevent injury to them and also necessary for proper positioning of the limb to allow for anatomic placement of the prosthesis.   Dione Plover Ed Mandich, MD    01/29/2018, 1:49 PM

## 2018-01-29 NOTE — Progress Notes (Signed)
Assisted Dr. Rose with left, ultrasound guided, adductor canal block. Side rails up, monitors on throughout procedure. See vital signs in flow sheet. Tolerated Procedure well.  

## 2018-01-29 NOTE — Anesthesia Procedure Notes (Signed)
Spinal  Patient location during procedure: OR Start time: 01/29/2018 12:36 PM End time: 01/29/2018 12:39 PM Staffing Anesthesiologist: Myrtie Soman, MD Resident/CRNA: Claudia Desanctis, CRNA Performed: resident/CRNA  Preanesthetic Checklist Completed: patient identified, site marked, surgical consent, pre-op evaluation, timeout performed, IV checked, risks and benefits discussed and monitors and equipment checked Spinal Block Patient position: sitting Prep: DuraPrep Patient monitoring: continuous pulse ox and blood pressure Approach: right paramedian Location: L3-4 Injection technique: single-shot Needle Needle type: Pencan  Needle gauge: 24 G Needle length: 10 cm Needle insertion depth: 8 cm Assessment Sensory level: T6 Additional Notes Attempted midline without success.  Right paramedian, positive csf at beginning and end of injection

## 2018-01-29 NOTE — Plan of Care (Signed)
Postoperative course discussed with patient

## 2018-01-29 NOTE — Anesthesia Postprocedure Evaluation (Signed)
Anesthesia Post Note  Patient: Donzetta Kohut  Procedure(s) Performed: LEFT TOTAL KNEE ARTHROPLASTY (Left Knee)     Patient location during evaluation: PACU Anesthesia Type: Spinal Level of consciousness: oriented and awake and alert Pain management: pain level controlled Vital Signs Assessment: post-procedure vital signs reviewed and stable Respiratory status: spontaneous breathing, respiratory function stable and patient connected to nasal cannula oxygen Cardiovascular status: blood pressure returned to baseline and stable Postop Assessment: no headache, no backache and no apparent nausea or vomiting Anesthetic complications: no    Last Vitals:  Vitals:   01/29/18 1530 01/29/18 1545  BP: 128/83 122/75  Pulse: 72 71  Resp: 13 14  Temp: (!) 36.4 C 36.4 C  SpO2: 100% 97%    Last Pain:  Vitals:   01/29/18 1545  TempSrc: Axillary  PainSc: 0-No pain                 Alonnie Bieker S

## 2018-01-29 NOTE — Transfer of Care (Signed)
Immediate Anesthesia Transfer of Care Note  Patient: Diane Chambers  Procedure(s) Performed: LEFT TOTAL KNEE ARTHROPLASTY (Left Knee)  Patient Location: PACU  Anesthesia Type:Spinal  Level of Consciousness: awake, alert , oriented and patient cooperative  Airway & Oxygen Therapy: Patient Spontanous Breathing and Patient connected to face mask  Post-op Assessment: Report given to RN and Post -op Vital signs reviewed and stable  Post vital signs: Reviewed and stable  Last Vitals:  Vitals Value Taken Time  BP 104/61 01/29/2018  2:03 PM  Temp    Pulse 69 01/29/2018  2:04 PM  Resp 14 01/29/2018  2:04 PM  SpO2 100 % 01/29/2018  2:04 PM  Vitals shown include unvalidated device data.  Last Pain:  Vitals:   01/29/18 1201  TempSrc:   PainSc: 0-No pain      Patients Stated Pain Goal: 4 (37/54/36 0677)  Complications: No apparent anesthesia complications

## 2018-01-29 NOTE — Evaluation (Addendum)
Physical Therapy Evaluation Patient Details Name: Diane Chambers MRN: [id number] DOB: 09/01/1933 Today's Date: 01/29/2018   History of Present Illness  84 YO female s/p L TKR on 9/9. PMH includes colitis, TIAs, HTN, cholecystectomy, R TKR.   Clinical Impression  Pt s/p L TKR. Pt with difficulty with mobility tasks, high pain related to L knee and low back (suspected due to spinal), decreased activity tolerance, and low BP with mobility. Pt to benefit from acute PT to address deficits. Pt with many complaints of pain this session, in her L knee, neck, and low back. PT attempted repositioning multiple times with little relief in pt's pain, RN notified and RN administered IV pain medication. PT to progress mobility as tolerated by pt.   As a note, pt with R toe fx, and utilizes boot on R foot with mobility. PT donned R boot and L shoe for this session.   BP, HR after ambulation and symptoms of nausea and dizziness: 93/49, 51    Follow Up Recommendations Follow surgeon's recommendation for DC plan and follow-up therapies;Supervision for mobility/OOB(SNF)    Equipment Recommendations  None recommended by PT    Recommendations for Other Services       Precautions / Restrictions Precautions Precautions: Fall;Knee Required Braces or Orthoses: Knee Immobilizer - Left, R boot for toe fracture from fall last week  Knee Immobilizer - Left: Discontinue once straight leg raise with < 10 degree lag;On when out of bed or walking Restrictions Weight Bearing Restrictions: No Other Position/Activity Restrictions: WBAT       Mobility  Bed Mobility Overal bed mobility: Needs Assistance Bed Mobility: Supine to Sit;Sit to Supine     Supine to sit: Min assist Sit to supine: Min assist   General bed mobility comments: Min assist for LLE management, steadying on EOB, scooting up in bed upon return to supine. Verbal cuing for sequencing.   Transfers Overall transfer level: Needs assistance Equipment  used: Rolling walker (2 wheeled) Transfers: Sit to/from Stand Sit to Stand: Min assist         General transfer comment: Min assist for power up, hip extension, and steadying upon standing. Verbal cuing for hand placement. Also did stand pivot transfer from chair in room to recliner, as well as stand pivot transfer +2 back to bed to make pt more comfortable. Min to mod assist required for stand pivot transfers for trunk support.   Ambulation/Gait Ambulation/Gait assistance: Min assist Gait Distance (Feet): 10 Feet Assistive device: Rolling walker (2 wheeled) Gait Pattern/deviations: Step-to pattern;Decreased stance time - left;Decreased weight shift to left;Antalgic Gait velocity: decr, very painful with vocalizations    General Gait Details: min assist for steadying, directing ambulation, cuing for sequencing. After 10 ft ambulation, pt with reports of dizziness and nausea. Pt immediately sat in chair in room, PT took BP, was 93/49 with pulse of 51. PT assisted in transferring pt from chair to recliner in room. Approximately 1-2 minutes later, pt with loud vocalizations about LBP, L knee pain, and neck pain. Most complaints were in reference to low back pain, around the level of the sacrum. PT unable to relieve pain with repositioning, discussed with RN and pain most likely coming from application of spinal as pain was burning.    Stairs            Wheelchair Mobility    Modified Rankin (Stroke Patients Only)       Balance Overall balance assessment: Needs assistance;History of Falls Sitting-balance support: No upper  extremity supported Sitting balance-Leahy Scale: Fair       Standing balance-Leahy Scale: Poor Standing balance comment: relies on RW for steadying and UE support during ambulation                              Pertinent Vitals/Pain Pain Assessment: 0-10 Pain Score: 5  Pain Location: L knee, back  Pain Descriptors / Indicators:  Burning;Sore;Operative site guarding Pain Intervention(s): Limited activity within patient's tolerance;Repositioned;Ice applied;Monitored during session;Patient requesting pain meds-RN notified    Home Living Family/patient expects to be discharged to:: Skilled nursing facility Living Arrangements: Alone                    Prior Function Level of Independence: Independent with assistive device(s)         Comments: using cane prior to admission. Pt reports she has someone come in to help with cleaning      Hand Dominance   Dominant Hand: Right    Extremity/Trunk Assessment   Upper Extremity Assessment Upper Extremity Assessment: Overall WFL for tasks assessed    Lower Extremity Assessment Lower Extremity Assessment: Generalized weakness       Communication   Communication: HOH  Cognition Arousal/Alertness: Awake/alert Behavior During Therapy: WFL for tasks assessed/performed Overall Cognitive Status: Within Functional Limits for tasks assessed                                        General Comments      Exercises     Assessment/Plan    PT Assessment Patient needs continued PT services  PT Problem List Decreased strength;Pain;Decreased range of motion;Decreased activity tolerance;Decreased knowledge of use of DME;Decreased balance;Decreased safety awareness;Decreased mobility       PT Treatment Interventions DME instruction;Therapeutic activities;Gait training;Therapeutic exercise;Patient/family education;Balance training;Functional mobility training    PT Goals (Current goals can be found in the Care Plan section)  Acute Rehab PT Goals PT Goal Formulation: With patient Time For Goal Achievement: 02/12/18 Potential to Achieve Goals: Good    Frequency 7X/week   Barriers to discharge        Co-evaluation               AM-PAC PT "6 Clicks" Daily Activity  Outcome Measure Difficulty turning over in bed (including adjusting  bedclothes, sheets and blankets)?: Unable Difficulty moving from lying on back to sitting on the side of the bed? : Unable Difficulty sitting down on and standing up from a chair with arms (e.g., wheelchair, bedside commode, etc,.)?: Unable Help needed moving to and from a bed to chair (including a wheelchair)?: A Little Help needed walking in hospital room?: A Little Help needed climbing 3-5 steps with a railing? : A Lot 6 Click Score: 11    End of Session Equipment Utilized During Treatment: Gait belt Activity Tolerance: Patient limited by pain Patient left: in bed;with call bell/phone within reach;with nursing/sitter in room;with SCD's reapplied(pt verbally agreed to press nurse's button when ready to go back to bed or mobilize in any way) Nurse Communication: Mobility status PT Visit Diagnosis: Difficulty in walking, not elsewhere classified (R26.2);Unsteadiness on feet (R26.81)    Time: 6967-8938 PT Time Calculation (min) (ACUTE ONLY): 47 min   Charges:   PT Evaluation $PT Eval Low Complexity: 1 Low PT Treatments $Gait Training: 8-22 mins $Therapeutic Activity: 8-22 mins  Julien Girt, PT, DPT  Pager # [phone]    Roxine Caddy D Vallie Fayette 01/29/2018, 8:22 PM

## 2018-01-29 NOTE — Anesthesia Procedure Notes (Signed)
Date/Time: 01/29/2018 12:40 PM Performed by: Claudia Desanctis, CRNA Oxygen Delivery Method: Simple face mask

## 2018-01-29 NOTE — Anesthesia Procedure Notes (Signed)
Anesthesia Regional Block: Adductor canal block   Pre-Anesthetic Checklist: ,, timeout performed, Correct Patient, Correct Site, Correct Laterality, Correct Procedure, Correct Position, site marked, Risks and benefits discussed,  Surgical consent,  Pre-op evaluation,  At surgeon's request and post-op pain management  Laterality: Left  Prep: chloraprep       Needles:  Injection technique: Single-shot  Needle Type: Echogenic Needle     Needle Length: 9cm      Additional Needles:   Procedures:,,,, ultrasound used (permanent image in chart),,,,  Narrative:  Start time: 01/29/2018 11:49 AM End time: 01/29/2018 11:57 AM Injection made incrementally with aspirations every 5 mL.  Performed by: Personally  Anesthesiologist: Myrtie Soman, MD  Additional Notes: Patient tolerated the procedure well without complications

## 2018-01-29 NOTE — Discharge Instructions (Signed)
° °Dr. Frank Aluisio °Total Joint Specialist °[facility] °[address] °[place], [place] [address] °[phone] ° °TOTAL KNEE REPLACEMENT POSTOPERATIVE DIRECTIONS ° °Knee Rehabilitation, Guidelines Following Surgery  °Results after knee surgery are often greatly improved when you follow the exercise, range of motion and muscle strengthening exercises prescribed by your doctor. Safety measures are also important to protect the knee from further injury. Any time any of these exercises cause you to have increased pain or swelling in your knee joint, decrease the amount until you are comfortable again and slowly increase them. If you have problems or questions, call your caregiver or physical therapist for advice.  ° °HOME CARE INSTRUCTIONS  °• Remove items at home which could result in a fall. This includes throw rugs or furniture in walking pathways.  °· ICE to the affected knee every three hours for 30 minutes at a time and then as needed for pain and swelling.  Continue to use ice on the knee for pain and swelling from surgery. You may notice swelling that will progress down to the foot and ankle.  This is normal after surgery.  Elevate the leg when you are not up walking on it.   °· Continue to use the breathing machine which will help keep your temperature down.  It is common for your temperature to cycle up and down following surgery, especially at night when you are not up moving around and exerting yourself.  The breathing machine keeps your lungs expanded and your temperature down. °· Do not place pillow under knee, focus on keeping the knee straight while resting ° °DIET °You may resume your previous home diet once your are discharged from the hospital. ° °DRESSING / WOUND CARE / SHOWERING °You may shower 3 days after surgery, but keep the wounds dry during showering.  You may use an occlusive plastic wrap (Press'n Seal for example), NO SOAKING/SUBMERGING IN THE BATHTUB.  If the bandage  gets wet, change with a clean dry gauze.  If the incision gets wet, pat the wound dry with a clean towel. °You may start showering once you are discharged home but do not submerge the incision under water. Just pat the incision dry and apply a dry gauze dressing on daily. °Change the surgical dressing daily and reapply a dry dressing each time. ° °ACTIVITY °Walk with your walker as instructed. °Use walker as long as suggested by your caregivers. °Avoid periods of inactivity such as sitting longer than an hour when not asleep. This helps prevent blood clots.  °You may resume a sexual relationship in one month or when given the OK by your doctor.  °You may return to work once you are cleared by your doctor.  °Do not drive a car for 6 weeks or until released by you surgeon.  °Do not drive while taking narcotics. ° °WEIGHT BEARING °Weight bearing as tolerated with assist device (walker, cane, etc) as directed, use it as long as suggested by your surgeon or therapist, typically at least 4-6 weeks. ° °POSTOPERATIVE CONSTIPATION PROTOCOL °Constipation - defined medically as fewer than three stools per week and severe constipation as less than one stool per week. ° °One of the most common issues patients have following surgery is constipation.  Even if you have a regular bowel pattern at home, your normal regimen is likely to be disrupted due to multiple reasons following surgery.  Combination of anesthesia, postoperative narcotics, change in appetite and fluid intake all can affect your bowels.    In order to avoid complications following surgery, here are some recommendations in order to help you during your recovery period. ° °Colace (docusate) - Pick up an over-the-counter form of Colace or another stool softener and take twice a day as long as you are requiring postoperative pain medications.  Take with a full glass of water daily.  If you experience loose stools or diarrhea, hold the colace until you stool forms back  up.  If your symptoms do not get better within 1 week or if they get worse, check with your doctor. ° °Dulcolax (bisacodyl) - Pick up over-the-counter and take as directed by the product packaging as needed to assist with the movement of your bowels.  Take with a full glass of water.  Use this product as needed if not relieved by Colace only.  ° °MiraLax (polyethylene glycol) - Pick up over-the-counter to have on hand.  MiraLax is a solution that will increase the amount of water in your bowels to assist with bowel movements.  Take as directed and can mix with a glass of water, juice, soda, coffee, or tea.  Take if you go more than two days without a movement. °Do not use MiraLax more than once per day. Call your doctor if you are still constipated or irregular after using this medication for 7 days in a row. ° °If you continue to have problems with postoperative constipation, please contact the office for further assistance and recommendations.  If you experience "the worst abdominal pain ever" or develop nausea or vomiting, please contact the office immediatly for further recommendations for treatment. ° °ITCHING ° If you experience itching with your medications, try taking only a single pain pill, or even half a pain pill at a time.  You can also use Benadryl over the counter for itching or also to help with sleep.  ° °TED HOSE STOCKINGS °Wear the elastic stockings on both legs for three weeks following surgery during the day but you may remove then at night for sleeping. ° °MEDICATIONS °See your medication summary on the “After Visit Summary” that the nursing staff will review with you prior to discharge.  You may have some home medications which will be placed on hold until you complete the course of blood thinner medication.  It is important for you to complete the blood thinner medication as prescribed by your surgeon.  Continue your approved medications as instructed at time of discharge. ° °PRECAUTIONS °If  you experience chest pain or shortness of breath - call 911 immediately for transfer to the hospital emergency department.  °If you develop a fever greater that 101 F, purulent drainage from wound, increased redness or drainage from wound, foul odor from the wound/dressing, or calf pain - CONTACT YOUR SURGEON.   °                                                °FOLLOW-UP APPOINTMENTS °Make sure you keep all of your appointments after your operation with your surgeon and caregivers. You should call the office at the above phone number and make an appointment for approximately two weeks after the date of your surgery or on the date instructed by your surgeon outlined in the "After Visit Summary". ° ° °RANGE OF MOTION AND STRENGTHENING EXERCISES  °Rehabilitation of the knee is important following a knee injury or   an operation. After just a few days of immobilization, the muscles of the thigh which control the knee become weakened and shrink (atrophy). Knee exercises are designed to build up the tone and strength of the thigh muscles and to improve knee motion. Often times heat used for twenty to thirty minutes before working out will loosen up your tissues and help with improving the range of motion but do not use heat for the first two weeks following surgery. These exercises can be done on a training (exercise) mat, on the floor, on a table or on a bed. Use what ever works the best and is most comfortable for you Knee exercises include:  °• Leg Lifts - While your knee is still immobilized in a splint or cast, you can do straight leg raises. Lift the leg to 60 degrees, hold for 3 sec, and slowly lower the leg. Repeat 10-20 times 2-3 times daily. Perform this exercise against resistance later as your knee gets better.  °• Quad and Hamstring Sets - Tighten up the muscle on the front of the thigh (Quad) and hold for 5-10 sec. Repeat this 10-20 times hourly. Hamstring sets are done by pushing the foot backward against an  object and holding for 5-10 sec. Repeat as with quad sets.  °· Leg Slides: Lying on your back, slowly slide your foot toward your buttocks, bending your knee up off the floor (only go as far as is comfortable). Then slowly slide your foot back down until your leg is flat on the floor again. °· Angel Wings: Lying on your back spread your legs to the side as far apart as you can without causing discomfort.  °A rehabilitation program following serious knee injuries can speed recovery and prevent re-injury in the future due to weakened muscles. Contact your doctor or a physical therapist for more information on knee rehabilitation.  ° °IF YOU ARE TRANSFERRED TO A SKILLED REHAB FACILITY °If the patient is transferred to a skilled rehab facility following release from the hospital, a list of the current medications will be sent to the facility for the patient to continue.  When discharged from the skilled rehab facility, please have the facility set up the patient's Home Health Physical Therapy prior to being released. Also, the skilled facility will be responsible for providing the patient with their medications at time of release from the facility to include their pain medication, the muscle relaxants, and their blood thinner medication. If the patient is still at the rehab facility at time of the two week follow up appointment, the skilled rehab facility will also need to assist the patient in arranging follow up appointment in our office and any transportation needs. ° °MAKE SURE YOU:  °• Understand these instructions.  °• Get help right away if you are not doing well or get worse.  ° ° °Pick up stool softner and laxative for home use following surgery while on pain medications. °Do not submerge incision under water. °Please use good hand washing techniques while changing dressing each day. °May shower starting three days after surgery. °Please use a clean towel to pat the incision dry following showers. °Continue to  use ice for pain and swelling after surgery. °Do not use any lotions or creams on the incision until instructed by your surgeon. ° °

## 2018-01-29 NOTE — Interval H&P Note (Signed)
History and Physical Interval Note:  01/29/2018 11:26 AM  Diane Chambers  has presented today for surgery, with the diagnosis of left knee osteoarthritis  The various methods of treatment have been discussed with the patient and family. After consideration of risks, benefits and other options for treatment, the patient has consented to  Procedure(s): LEFT TOTAL KNEE ARTHROPLASTY (Left) as a surgical intervention .  The patient's history has been reviewed, patient examined, no change in status, stable for surgery.  I have reviewed the patient's chart and labs.  Questions were answered to the patient's satisfaction.     Pilar Plate Shaya Altamura

## 2018-01-29 NOTE — Anesthesia Preprocedure Evaluation (Signed)
Anesthesia Evaluation  Patient identified by MRN, date of birth, ID band Patient awake    Reviewed: Allergy & Precautions, NPO status , Patient's Chart, lab work & pertinent test results  Airway Mallampati: II  TM Distance: >3 FB Neck ROM: Full    Dental no notable dental hx.    Pulmonary neg pulmonary ROS, former smoker,    Pulmonary exam normal breath sounds clear to auscultation       Cardiovascular hypertension, Normal cardiovascular exam Rhythm:Regular Rate:Normal     Neuro/Psych negative neurological ROS  negative psych ROS   GI/Hepatic Neg liver ROS, GERD  ,  Endo/Other  negative endocrine ROS  Renal/GU negative Renal ROS  negative genitourinary   Musculoskeletal negative musculoskeletal ROS (+)   Abdominal   Peds negative pediatric ROS (+)  Hematology negative hematology ROS (+)   Anesthesia Other Findings   Reproductive/Obstetrics negative OB ROS                             Anesthesia Physical Anesthesia Plan  ASA: II  Anesthesia Plan: Spinal   Post-op Pain Management:  Regional for Post-op pain   Induction: Intravenous  PONV Risk Score and Plan: 2 and Ondansetron, Dexamethasone and Treatment may vary due to age or medical condition  Airway Management Planned: Simple Face Mask  Additional Equipment:   Intra-op Plan:   Post-operative Plan:   Informed Consent: I have reviewed the patients History and Physical, chart, labs and discussed the procedure including the risks, benefits and alternatives for the proposed anesthesia with the patient or authorized representative who has indicated his/her understanding and acceptance.   Dental advisory given  Plan Discussed with: CRNA and Surgeon  Anesthesia Plan Comments:         Anesthesia Quick Evaluation

## 2018-01-30 ENCOUNTER — Encounter (HOSPITAL_COMMUNITY): Payer: Self-pay | Admitting: Orthopedic Surgery

## 2018-01-30 LAB — BASIC METABOLIC PANEL
Anion gap: 10 (ref 5–15)
BUN: 9 mg/dL (ref 8–23)
CHLORIDE: 102 mmol/L (ref 98–111)
CO2: 27 mmol/L (ref 22–32)
CREATININE: 0.56 mg/dL (ref 0.44–1.00)
Calcium: 8.8 mg/dL — ABNORMAL LOW (ref 8.9–10.3)
GFR calc Af Amer: >60 mL/min (ref >60)
GFR calc non Af Amer: >60 mL/min (ref >60)
GLUCOSE: 164 mg/dL — AB (ref 70–99)
POTASSIUM: 3.8 mmol/L (ref 3.5–5.1)
Sodium: 139 mmol/L (ref 135–145)

## 2018-01-30 LAB — CBC
HEMATOCRIT: 34.2 % — AB (ref 36.0–46.0)
HEMOGLOBIN: 11.1 g/dL — AB (ref 12.0–15.0)
MCH: 31.6 pg (ref 26.0–34.0)
MCHC: 32.5 g/dL (ref 30.0–36.0)
MCV: 97.4 fL (ref 78.0–100.0)
Platelets: 378 10*3/uL (ref 150–400)
RBC: 3.51 MIL/uL — AB (ref 3.87–5.11)
RDW: 14.4 % (ref 11.5–15.5)
WBC: 12.2 10*3/uL — ABNORMAL HIGH (ref 4.0–10.5)

## 2018-01-30 MED ORDER — POLYETHYLENE GLYCOL 3350 17 G PO PACK: 17.0000 g | PACK | Freq: Every day | ORAL | 0 refills | Status: DC | PRN

## 2018-01-30 MED ORDER — DOCUSATE SODIUM 100 MG PO CAPS: 100.0000 mg | ORAL_CAPSULE | Freq: Two times a day (BID) | ORAL | 0 refills | Status: DC

## 2018-01-30 MED ORDER — ONDANSETRON HCL 4 MG PO TABS: 4.0000 mg | ORAL_TABLET | Freq: Four times a day (QID) | ORAL | 0 refills | Status: DC | PRN

## 2018-01-30 MED ORDER — HYDROXYZINE HCL 50 MG/ML IM SOLN
25.0000 mg | Freq: Four times a day (QID) | INTRAMUSCULAR | Status: DC | PRN
  Administered 2018-01-30: 25 mg via INTRAMUSCULAR
  Filled 2018-01-30 (×2): qty 0.5

## 2018-01-30 MED ORDER — HYDROMORPHONE HCL 2 MG PO TABS
2.0000 mg | ORAL_TABLET | ORAL | Status: DC | PRN
  Administered 2018-01-30: 4 mg via ORAL
  Administered 2018-01-30 – 2018-01-31 (×3): 2 mg via ORAL
  Filled 2018-01-30: qty 1
  Filled 2018-01-30: qty 2
  Filled 2018-01-30 (×2): qty 1

## 2018-01-30 NOTE — Care Management Note (Signed)
Case Management Note  Patient Details  Name: Diane Chambers MRN: [id number] Date of Birth: 1933-08-12  Subjective/Objective:     Plan for d/c to SNF, discharge planning per CSW. [phone]  Action/Plan:   Expected Discharge Date:                  Expected Discharge Plan:  [place]  In-House Referral:  Clinical Social Work  Discharge planning Services  CM Consult  Post Acute Care Choice:  NA Choice offered to:  NA  DME Arranged:  N/A DME Agency:  NA  HH Arranged:  NA HH Agency:  NA  Status of Service:  Completed, signed off  If discussed at [place] of Stay Meetings, dates discussed:    Additional Comments:  Guadalupe Maple, RN 01/30/2018, 11:30 AM

## 2018-01-30 NOTE — Progress Notes (Signed)
Patient chose bed at [facility].  [facility] initiated by SNF. CSW will updated medical staff when authorization is received.   Kathrin Greathouse, Marlinda Mike, MSW Clinical Social Worker  [phone] 01/30/2018  12:18 PM

## 2018-01-30 NOTE — Clinical Social Work Note (Addendum)
Clinical Social Work Assessment  Patient Details  Name: Diane Chambers MRN: [id number] Date of Birth: [birth year]  Date of referral:  01/30/18               Reason for consult:  Discharge Planning                Permission sought to share information with:    Permission granted to share information::  Yes, Verbal Permission Granted  Name::       Diane Chambers  Agency::  SNF  Relationship::  Son  Contact Information:    [phone]  Housing/Transportation Living arrangements for the past 2 months:  [place] of Information:  Patient Patient Interpreter Needed:  None Criminal Activity/Legal Involvement Pertinent to Current Situation/Hospitalization:  No - Comment as needed Significant Relationships:  Adult Children Lives with:  Self Do you feel safe going back to the place where you live?  Yes Need for family participation in patient care:  Yes   Care giving concerns:   Patient is being admitted for left total knee arthroplasty.  Patient lives alone and has fallen a few times in the past few months. The patient reports she can complete her ADL's. She uses a modified bathroom- a bar in front of the toilet to pull herself up. Patient uses a cane to keep her balance.  Patient reports she still drives and walks a few blocks from her home to park her car in parking deck.    Social Worker assessment / plan:  CSW met with the patient at bedside to discuss discharge plan to SNF for rehab. Patient informed CSW prior to her hospital admission she visited [facility].CSW informed the patient about the SNF process. CSW sent patient clinicals to [facility] and [facility].  Patient prefers a private room. Patient inquired about availability at [facility]. No private rooms available.  Patient chose bed at [facility]. Insurance authorization initiated.   Plan: SNF  Employment status:  Retired Nurse, adult PT Recommendations:  [place] / Referral to community resources:  [place]  Patient/Family's Response to care:  Agreeable and Responding well to care.   Patient/Family's Understanding of and Emotional Response to Diagnosis, Current Treatment, and Prognosis: Patient alert and oriented x4. Patient knowledgeable of medical diagnosis and care. Patient son Diane Chambers also very involved in patient care.   Emotional Assessment Appearance:  Appears stated age Attitude/Demeanor/Rapport:    Affect (typically observed):  Accepting, Pleasant Orientation:  Oriented to Self, Oriented to Situation, Oriented to Place, Oriented to  Time Alcohol / Substance use:  Not Applicable Psych involvement (Current and /or in the community):  No (Comment)  Discharge Needs  Concerns to be addressed:  Discharge Planning Concerns Readmission within the last 30 days:  No Current discharge risk:  Dependent with Mobility, Physical Impairment Barriers to Discharge:  Continued Medical Work up, [place], LCSW 01/30/2018, 12:20 PM

## 2018-01-30 NOTE — Progress Notes (Signed)
Physical Therapy Treatment Patient Details Name: Diane Chambers MRN: [id number] DOB: [birth year] Today's Date: 01/30/2018    History of Present Illness 84 YO female s/p L TKR on 9/9. PMH includes colitis, TIAs, HTN, cholecystectomy, R TKR.     PT Comments    POD # 1 am session Pt OOB in recliner.  Assisted with TKR TE's and amb a limited distance.  Pt progressing slowly and present with unsteady gait.  Pt will need ST Rehab at SNF.   Follow Up Recommendations  Follow surgeon's recommendation for DC plan and follow-up therapies;Supervision for mobility/OOB;SNF     Equipment Recommendations  None recommended by PT    Recommendations for Other Services       Precautions / Restrictions Precautions Precautions: Fall;Knee Precaution Comments: instructed on KI use  Required Braces or Orthoses: Other Brace/Splint Knee Immobilizer - Left: Discontinue once straight leg raise with < 10 degree lag;On when out of bed or walking Other Brace/Splint: R Darco shoe for recent Fx 5th metatarsal 01/24/18 Restrictions Weight Bearing Restrictions: No Other Position/Activity Restrictions: WBAT     Mobility  Bed Mobility               General bed mobility comments: Pt OOB in recliner   Transfers Overall transfer level: Needs assistance Equipment used: Rolling walker (2 wheeled) Transfers: Sit to/from Stand Sit to Stand: Min assist         General transfer comment: 50% VC's on proper hand placement and safety with turns  Ambulation/Gait Ambulation/Gait assistance: Min assist Gait Distance (Feet): 12 Feet Assistive device: Rolling walker (2 wheeled) Gait Pattern/deviations: Step-to pattern;Decreased stance time - left;Decreased weight shift to left;Antalgic Gait velocity: decreased   General Gait Details: very unsteady gait with limited amb distance due to weakness and instbility.  HIGH FALL RISK   Stairs             Wheelchair Mobility    Modified Rankin (Stroke  Patients Only)       Balance                                            Cognition Arousal/Alertness: Awake/alert Behavior During Therapy: WFL for tasks assessed/performed Overall Cognitive Status: Within Functional Limits for tasks assessed                                 General Comments: pleasant but required repeat direction      Exercises   Total Knee Replacement TE's 10 reps B LE ankle pumps 10 reps towel squeezes 10 reps knee presses 10 reps heel slides  10 reps SAQ's 10 reps SLR's 10 reps ABD Followed by ICE    General Comments        Pertinent Vitals/Pain Pain Assessment: Faces Faces Pain Scale: Hurts a little bit Pain Location: L knee Pain Descriptors / Indicators: Burning;Sore;Operative site guarding Pain Intervention(s): Monitored during session;Patient requesting pain meds-RN notified;Repositioned    Home Living                      Prior Function            PT Goals (current goals can now be found in the care plan section) Progress towards PT goals: Progressing toward goals    Frequency    7X/week  PT Plan Current plan remains appropriate    Co-evaluation              AM-PAC PT "6 Clicks" Daily Activity  Outcome Measure  Difficulty turning over in bed (including adjusting bedclothes, sheets and blankets)?: A Lot Difficulty moving from lying on back to sitting on the side of the bed? : A Lot Difficulty sitting down on and standing up from a chair with arms (e.g., wheelchair, bedside commode, etc,.)?: A Lot Help needed moving to and from a bed to chair (including a wheelchair)?: A Lot Help needed walking in hospital room?: A Lot Help needed climbing 3-5 steps with a railing? : A Lot 6 Click Score: 12    End of Session Equipment Utilized During Treatment: Gait belt;Left knee immobilizer;Other (comment)(R Darco shoe) Activity Tolerance: Patient limited by pain;Patient limited by  fatigue Patient left: in chair;with call bell/phone within reach;with nursing/sitter in room;with SCD's reapplied Nurse Communication: Mobility status PT Visit Diagnosis: Difficulty in walking, not elsewhere classified (R26.2);Unsteadiness on feet (R26.81)     Time: 3524-8185 PT Time Calculation (min) (ACUTE ONLY): 27 min  Charges:  $Gait Training: 8-22 mins $Therapeutic Exercise: 8-22 mins                     Rica Koyanagi  PTA [facility]  Acute  Rehab Pager      [phone] Office      [phone]

## 2018-01-30 NOTE — Progress Notes (Addendum)
Subjective: 1 Day Post-Op Procedure(s) (LRB): LEFT TOTAL KNEE ARTHROPLASTY (Left) Patient reports pain as moderate.   Patient seen in rounds by Dr. Wynelle Link. Patient is well, and has had no acute complaints or problems other than pain in the left knee. Foley catheter removed this AM. Denies chest pain, SOB or calf pain. No issues overnight. We will continue therapy today.   Objective: Vital signs in last 24 hours: Temp:  [97.5 F (36.4 C)-98.4 F (36.9 C)] 97.8 F (36.6 C) (09/10 0720) Pulse Rate:  [66-87] 78 (09/10 0720) Resp:  [7-21] 17 (09/10 0527) BP: (104-181)/(57-88) 129/69 (09/10 0720) SpO2:  [94 %-100 %] 100 % (09/10 0720) Weight:  [58.3 kg] 58.3 kg (09/09 1103)  Intake/Output from previous day:  Intake/Output Summary (Last 24 hours) at 01/30/2018 0735 Last data filed at 01/30/2018 0700 Gross per 24 hour  Intake 3544.82 ml  Output 2600 ml  Net 944.82 ml    Labs: Recent Labs    01/30/18 0438  HGB 11.1*   Recent Labs    01/30/18 0438  WBC 12.2*  RBC 3.51*  HCT 34.2*  PLT 378   Recent Labs    01/30/18 0438  NA 139  K 3.8  CL 102  CO2 27  BUN 9  CREATININE 0.56  GLUCOSE 164*  CALCIUM 8.8*   Exam: General - Patient is Alert and Oriented Extremity - Neurologically intact Neurovascular intact Sensation intact distally Dorsiflexion/Plantar flexion intact Dressing - dressing C/D/I Motor Function - intact, moving foot and toes well on exam.   Past Medical History:  Diagnosis Date  . Age-related macular degeneration, wet, right eye ([place])   . Carotid artery stenosis, asymptomatic, bilateral    last duplex in epic 02-18-2015  bilateral ICA <50%  . Chronically dry eyes   . Fracture 01/24/2018   right foot-neck of fifth metatarsal   . GERD (gastroesophageal reflux disease)   . Hiatal hernia   . History of colitis 12/16/2016   secondary to champylobacter  . History of recurrent TIAs    total x4  last one 12-29-2014---  (01-23-2018 per pt no  residual's)  . History of syncope    11/ 2018 approx. , cardiac work-up done by dr Einar Gip   . Hypertension   . Hypothyroidism    followed by pcp  . Lower urinary tract symptoms (LUTS)   . OA (osteoarthritis)    knees  . RLS (restless legs syndrome)   . Venous stasis    01-23-2018 per pt elevates feet when sitting , if not toes become numb  . Wears glasses   . Wears hearing aid in right ear     Assessment/Plan: 1 Day Post-Op Procedure(s) (LRB): LEFT TOTAL KNEE ARTHROPLASTY (Left) Principal Problem:   OA (osteoarthritis) of knee  Estimated body mass index is 20.44 kg/m as calculated from the following:   Height as of this encounter: 5' 6.5" (1.689 m).   Weight as of this encounter: 58.3 kg. Advance diet Up with therapy  Anticipated LOS equal to or greater than 2 midnights due to - Age 82 and older with one or more of the following:  - Obesity  - Expected need for hospital services (PT, OT, Nursing) required for safe  discharge  - Anticipated need for postoperative skilled nursing care or inpatient rehab  - Active co-morbidities: Coronary Artery Disease and Hx of TIA x3 OR   - Unanticipated findings during/Post Surgery: None  - Patient is a high risk of re-admission due to: None  DVT Prophylaxis - Aspirin Weight bearing as tolerated. D/C O2 and pulse ox and try on room air. Hemovac pulled without difficulty, will continue therapy today.  Plan is to go Skilled nursing facility after hospital stay. Plan for discharge tomorrow depending on bed availability. Order placed for social work consult.   Theresa Duty, PA-C Orthopedic Surgery 01/30/2018, 7:35 AM

## 2018-01-30 NOTE — NC FL2 (Signed)
[place] MEDICAID FL2 LEVEL OF CARE SCREENING TOOL     IDENTIFICATION  Patient Name: Diane Chambers Birthdate: 01-31-1934 Sex: female Admission Date (Current Location): 01/29/2018  [facility] and [place] Number:  [place] and Address:  [facility],  [place] [address] [place]      Provider Number: [id number]  Attending Physician Name and Address:  Gaynelle Arabian, MD  Relative Name and Phone Number:       Current Level of Care: Hospital Recommended Level of Care: [place] Prior Approval Number:    Date Approved/Denied:   PASRR Number:    Discharge Plan: SNF    Current Diagnoses: Patient Active Problem List   Diagnosis Date Noted  . OA (osteoarthritis) of knee 01/29/2018  . Colitis 12/16/2016  . Confusion 12/29/2014  . TIA (transient ischemic attack) 12/29/2014  . Essential hypertension 12/29/2014  . Hyponatremia 12/29/2014    Orientation RESPIRATION BLADDER Height & Weight     Self, Time, Situation, Place  Normal Continent Weight: 128 lb 9 oz (58.3 kg) Height:  5' 6.5" (168.9 cm)  BEHAVIORAL SYMPTOMS/MOOD NEUROLOGICAL BOWEL NUTRITION STATUS      Continent Diet(Regular )  AMBULATORY STATUS COMMUNICATION OF NEEDS Skin   Extensive Assist Verbally Normal                       Personal Care Assistance Level of Assistance  Bathing, Feeding, Dressing Bathing Assistance: Limited assistance Feeding assistance: Independent Dressing Assistance: Limited assistance     Functional Limitations Info  Sight, Hearing, Speech Sight Info: Adequate Hearing Info: Adequate Speech Info: Adequate    SPECIAL CARE FACTORS FREQUENCY  PT (By licensed PT), OT (By licensed OT)     PT Frequency: 7x/week OT Frequency: 7x/week            Contractures Contractures Info: Present    Additional Factors Info  Code Status, Allergies, Psychotropic Code Status Info: Fullcode Allergies Info: Calcium-containing Compounds, Lactose  Intolerance (Gi) Psychotropic Info: Zoloft, Desyrel          Current Medications (01/30/2018):  This is the current hospital active medication list Current Facility-Administered Medications  Medication Dose Route Frequency Provider Last Rate Last Dose  . 0.9 %  sodium chloride infusion   Intravenous Continuous Gaynelle Arabian, MD 75 mL/hr at 01/30/18 0035    . acetaminophen (TYLENOL) tablet 1,000 mg  1,000 mg Oral Q6H Aluisio, Pilar Plate, MD   1,000 mg at 01/30/18 0603  . amLODipine (NORVASC) tablet 5 mg  5 mg Oral q morning - 10a Gaynelle Arabian, MD   5 mg at 01/30/18 1008  . aspirin EC tablet 325 mg  325 mg Oral BID Gaynelle Arabian, MD   325 mg at 01/30/18 1011  . atorvastatin (LIPITOR) tablet 20 mg  20 mg Oral QHS Gaynelle Arabian, MD   20 mg at 01/29/18 2155  . bisacodyl (DULCOLAX) suppository 10 mg  10 mg Rectal Daily PRN Gaynelle Arabian, MD      . diphenhydrAMINE (BENADRYL) 12.5 MG/5ML elixir 12.5-25 mg  12.5-25 mg Oral Q4H PRN Aluisio, Pilar Plate, MD      . docusate sodium (COLACE) capsule 100 mg  100 mg Oral BID Gaynelle Arabian, MD   100 mg at 01/30/18 1018  . HYDROmorphone (DILAUDID) injection 0.5-1 mg  0.5-1 mg Intravenous Q4H PRN Gaynelle Arabian, MD   1 mg at 01/29/18 1928  . HYDROmorphone (DILAUDID) tablet 2 mg  2 mg Oral Q4H PRN Gaynelle Arabian, MD      .  HYDROmorphone (DILAUDID) tablet 2-4 mg  2-4 mg Oral Q4H PRN Aluisio, Pilar Plate, MD      . levothyroxine (SYNTHROID, LEVOTHROID) tablet 112 mcg  112 mcg Oral QAC breakfast Gaynelle Arabian, MD   112 mcg at 01/30/18 0753  . losartan (COZAAR) tablet 100 mg  100 mg Oral q morning - 10a Gaynelle Arabian, MD   100 mg at 01/30/18 1016  . menthol-cetylpyridinium (CEPACOL) lozenge 3 mg  1 lozenge Oral PRN Gaynelle Arabian, MD       Or  . phenol (CHLORASEPTIC) mouth spray 1 spray  1 spray Mouth/Throat PRN Aluisio, Pilar Plate, MD      . methocarbamol (ROBAXIN) tablet 500 mg  500 mg Oral Q6H PRN Gaynelle Arabian, MD   500 mg at 01/30/18 0603   Or  . methocarbamol (ROBAXIN)  500 mg in dextrose 5 % 50 mL IVPB  500 mg Intravenous Q6H PRN Aluisio, Pilar Plate, MD      . metoCLOPramide (REGLAN) tablet 5-10 mg  5-10 mg Oral Q8H PRN Gaynelle Arabian, MD       Or  . metoCLOPramide (REGLAN) injection 5-10 mg  5-10 mg Intravenous Q8H PRN Aluisio, Pilar Plate, MD      . ondansetron (ZOFRAN) tablet 4 mg  4 mg Oral Q6H PRN Gaynelle Arabian, MD       Or  . ondansetron (ZOFRAN) injection 4 mg  4 mg Intravenous Q6H PRN Aluisio, Pilar Plate, MD      . oxyCODONE (Oxy IR/ROXICODONE) immediate release tablet 10-15 mg  10-15 mg Oral Q4H PRN Gaynelle Arabian, MD   15 mg at 01/30/18 2993  . oxyCODONE (Oxy IR/ROXICODONE) immediate release tablet 5-10 mg  5-10 mg Oral Q4H PRN Gaynelle Arabian, MD   5 mg at 01/29/18 1832  . pantoprazole (PROTONIX) EC tablet 40 mg  40 mg Oral BID AC Gaynelle Arabian, MD   40 mg at 01/30/18 0756  . polyethylene glycol (MIRALAX / GLYCOLAX) packet 17 g  17 g Oral Daily PRN Aluisio, Pilar Plate, MD      . sertraline (ZOLOFT) tablet 50 mg  50 mg Oral QHS Gaynelle Arabian, MD   50 mg at 01/29/18 2155  . sodium phosphate (FLEET) 7-19 GM/118ML enema 1 enema  1 enema Rectal Once PRN Aluisio, Pilar Plate, MD      . traZODone (DESYREL) tablet 50 mg  50 mg Oral QHS Gaynelle Arabian, MD   50 mg at 01/29/18 2155  . zolpidem (AMBIEN) tablet 5 mg  5 mg Oral QHS PRN Gaynelle Arabian, MD   5 mg at 01/29/18 2155     Discharge Medications: Please see discharge summary for a list of discharge medications.  Relevant Imaging Results:  Relevant Lab Results:   Additional Information ssn:[id number]  Lia Hopping, LCSW

## 2018-01-30 NOTE — Progress Notes (Signed)
Physical Therapy Treatment Patient Details Name: Diane Chambers MRN: [id number] DOB: 1933-06-17 Today's Date: 01/30/2018    History of Present Illness 84 YO female s/p L TKR on 9/9. PMH includes colitis, TIAs, HTN, cholecystectomy, R TKR.     PT Comments    POD # 1 pm session Assisted OOB to amb to bathroom.  50% VC's on proper hand placement and safety with turns   Also assisted with toilet transfer  very unsteady gait with limited amb distance due to weakness and instbility.  HIGH FALL RISK   impaired safety cognition Pt will need ST Rehab prior to safely returning home     Follow Up Recommendations  Follow surgeon's recommendation for DC plan and follow-up therapies;Supervision for mobility/OOB;SNF     Equipment Recommendations  None recommended by PT    Recommendations for Other Services       Precautions / Restrictions Precautions Precautions: Fall;Knee Precaution Comments: instructed on KI use  Required Braces or Orthoses: Other Brace/Splint Knee Immobilizer - Left: Discontinue once straight leg raise with < 10 degree lag;On when out of bed or walking Other Brace/Splint: R Darco shoe for recent Fx 5th metatarsal 01/24/18 Restrictions Weight Bearing Restrictions: No Other Position/Activity Restrictions: WBAT     Mobility  Bed Mobility Overal bed mobility: Needs Assistance Bed Mobility: Supine to Sit;Sit to Supine     Supine to sit: Min assist Sit to supine: Min assist   General bed mobility comments: assist L LE off bed and on with increased time due to increased pain level  Transfers Overall transfer level: Needs assistance Equipment used: Rolling walker (2 wheeled) Transfers: Sit to/from Stand Sit to Stand: Min assist         General transfer comment: 50% VC's on proper hand placement and safety with turns   Also assisted with toilet transfer  Ambulation/Gait Ambulation/Gait assistance: Min assist Gait Distance (Feet): 18 Feet Assistive device:  Rolling walker (2 wheeled) Gait Pattern/deviations: Step-to pattern;Decreased stance time - left;Decreased weight shift to left;Antalgic Gait velocity: decreased   General Gait Details: very unsteady gait with limited amb distance due to weakness and instbility.  HIGH FALL RISK   impaired safety cognition   Stairs             Wheelchair Mobility    Modified Rankin (Stroke Patients Only)       Balance                                            Cognition Arousal/Alertness: Awake/alert Behavior During Therapy: WFL for tasks assessed/performed Overall Cognitive Status: Within Functional Limits for tasks assessed                                 General Comments: pleasant but required repeat direction      Exercises      General Comments        Pertinent Vitals/Pain Pain Assessment: 0-10 Pain Score: 6  Faces Pain Scale: Hurts a little bit Pain Location: L knee Pain Descriptors / Indicators: Burning;Sore;Operative site guarding Pain Intervention(s): Monitored during session;Patient requesting pain meds-RN notified;Repositioned;Ice applied    Home Living                      Prior Function  PT Goals (current goals can now be found in the care plan section) Progress towards PT goals: Progressing toward goals    Frequency    7X/week      PT Plan Current plan remains appropriate    Co-evaluation              AM-PAC PT "6 Clicks" Daily Activity  Outcome Measure  Difficulty turning over in bed (including adjusting bedclothes, sheets and blankets)?: A Lot Difficulty moving from lying on back to sitting on the side of the bed? : A Lot Difficulty sitting down on and standing up from a chair with arms (e.g., wheelchair, bedside commode, etc,.)?: A Lot Help needed moving to and from a bed to chair (including a wheelchair)?: A Lot Help needed walking in hospital room?: A Lot Help needed climbing 3-5  steps with a railing? : A Lot 6 Click Score: 12    End of Session Equipment Utilized During Treatment: Gait belt;Left knee immobilizer;Other (comment)(R DARCO shoe) Activity Tolerance: Patient limited by pain;Patient limited by fatigue Patient left: in bed;with bed alarm set;with call bell/phone within reach Nurse Communication: Mobility status PT Visit Diagnosis: Difficulty in walking, not elsewhere classified (R26.2);Unsteadiness on feet (R26.81)     Time: 8341-9622 PT Time Calculation (min) (ACUTE ONLY): 25 min  Charges:  $Gait Training: 8-22 mins $Therapeutic Activity: 8-22 mins                     Rica Koyanagi  PTA Acute  Rehabilitation Services Pager      [phone] Office      [phone]

## 2018-01-31 DIAGNOSIS — K5909 Other constipation: Secondary | ICD-10-CM | POA: Diagnosis not present

## 2018-01-31 DIAGNOSIS — M179 Osteoarthritis of knee, unspecified: Secondary | ICD-10-CM | POA: Diagnosis not present

## 2018-01-31 DIAGNOSIS — M6281 Muscle weakness (generalized): Secondary | ICD-10-CM | POA: Diagnosis not present

## 2018-01-31 DIAGNOSIS — R2689 Other abnormalities of gait and mobility: Secondary | ICD-10-CM | POA: Diagnosis not present

## 2018-01-31 DIAGNOSIS — M2550 Pain in unspecified joint: Secondary | ICD-10-CM | POA: Diagnosis not present

## 2018-01-31 DIAGNOSIS — G2581 Restless legs syndrome: Secondary | ICD-10-CM | POA: Diagnosis not present

## 2018-01-31 DIAGNOSIS — M1710 Unilateral primary osteoarthritis, unspecified knee: Secondary | ICD-10-CM | POA: Diagnosis not present

## 2018-01-31 DIAGNOSIS — T402X5A Adverse effect of other opioids, initial encounter: Secondary | ICD-10-CM | POA: Diagnosis not present

## 2018-01-31 DIAGNOSIS — S92351D Displaced fracture of fifth metatarsal bone, right foot, subsequent encounter for fracture with routine healing: Secondary | ICD-10-CM | POA: Diagnosis not present

## 2018-01-31 DIAGNOSIS — Z96652 Presence of left artificial knee joint: Secondary | ICD-10-CM | POA: Diagnosis not present

## 2018-01-31 DIAGNOSIS — K219 Gastro-esophageal reflux disease without esophagitis: Secondary | ICD-10-CM | POA: Diagnosis not present

## 2018-01-31 DIAGNOSIS — E039 Hypothyroidism, unspecified: Secondary | ICD-10-CM | POA: Diagnosis not present

## 2018-01-31 DIAGNOSIS — H353210 Exudative age-related macular degeneration, right eye, stage unspecified: Secondary | ICD-10-CM | POA: Diagnosis not present

## 2018-01-31 DIAGNOSIS — I10 Essential (primary) hypertension: Secondary | ICD-10-CM | POA: Diagnosis not present

## 2018-01-31 LAB — CBC
HCT: 31.5 % — ABNORMAL LOW (ref 36.0–46.0)
HEMOGLOBIN: 10.4 g/dL — AB (ref 12.0–15.0)
MCH: 31.9 pg (ref 26.0–34.0)
MCHC: 33 g/dL (ref 30.0–36.0)
MCV: 96.6 fL (ref 78.0–100.0)
Platelets: 362 10*3/uL (ref 150–400)
RBC: 3.26 MIL/uL — AB (ref 3.87–5.11)
RDW: 14.3 % (ref 11.5–15.5)
WBC: 9.9 10*3/uL (ref 4.0–10.5)

## 2018-01-31 LAB — BASIC METABOLIC PANEL
ANION GAP: 9 (ref 5–15)
BUN: 7 mg/dL — ABNORMAL LOW (ref 8–23)
CHLORIDE: 103 mmol/L (ref 98–111)
CO2: 28 mmol/L (ref 22–32)
CREATININE: 0.46 mg/dL (ref 0.44–1.00)
Calcium: 9.1 mg/dL (ref 8.9–10.3)
GFR calc Af Amer: >60 mL/min (ref >60)
GFR calc non Af Amer: >60 mL/min (ref >60)
Glucose, Bld: 139 mg/dL — ABNORMAL HIGH (ref 70–99)
POTASSIUM: 3.3 mmol/L — AB (ref 3.5–5.1)
SODIUM: 140 mmol/L (ref 135–145)

## 2018-01-31 MED ORDER — ASPIRIN 325 MG PO TBEC: 325.0000 mg | DELAYED_RELEASE_TABLET | Freq: Two times a day (BID) | ORAL | 0 refills | Status: AC

## 2018-01-31 MED ORDER — METHOCARBAMOL 500 MG PO TABS: 500.0000 mg | ORAL_TABLET | Freq: Four times a day (QID) | ORAL | 0 refills | Status: DC | PRN

## 2018-01-31 MED ORDER — HYDROMORPHONE HCL 2 MG PO TABS: 2.0000 mg | ORAL_TABLET | Freq: Four times a day (QID) | ORAL | 0 refills | Status: DC | PRN

## 2018-01-31 MED ORDER — POTASSIUM CHLORIDE CRYS ER 20 MEQ PO TBCR
40.0000 meq | EXTENDED_RELEASE_TABLET | Freq: Two times a day (BID) | ORAL | Status: DC
  Administered 2018-01-31: 40 meq via ORAL
  Filled 2018-01-31: qty 2

## 2018-01-31 NOTE — Discharge Summary (Addendum)
Physician Discharge Summary   Patient ID: Diane Chambers MRN: [id number] DOB/AGE: 1933/09/25 84 y.o.  Admit date: 01/29/2018 Discharge date: 01/31/2018  Primary Diagnosis: Osteoarthritis, left knee   Admission Diagnoses:  Past Medical History:  Diagnosis Date  . Age-related macular degeneration, wet, right eye ([place])   . Carotid artery stenosis, asymptomatic, bilateral    last duplex in epic 02-18-2015  bilateral ICA <50%  . Chronically dry eyes   . Fracture 01/24/2018   right foot-neck of fifth metatarsal   . GERD (gastroesophageal reflux disease)   . Hiatal hernia   . History of colitis 12/16/2016   secondary to champylobacter  . History of recurrent TIAs    total x4  last one 12-29-2014---  (01-23-2018 per pt no residual's)  . History of syncope    11/ 2018 approx. , cardiac work-up done by dr Einar Gip   . Hypertension   . Hypothyroidism    followed by pcp  . Lower urinary tract symptoms (LUTS)   . OA (osteoarthritis)    knees  . RLS (restless legs syndrome)   . Venous stasis    01-23-2018 per pt elevates feet when sitting , if not toes become numb  . Wears glasses   . Wears hearing aid in right ear    Discharge Diagnoses:   Principal Problem:   OA (osteoarthritis) of knee  Estimated body mass index is 20.44 kg/m as calculated from the following:   Height as of this encounter: 5' 6.5" (1.689 m).   Weight as of this encounter: 58.3 kg.  Procedure:  Procedure(s) (LRB): LEFT TOTAL KNEE ARTHROPLASTY (Left)   Consults: None  HPI:  Diane Chambers is a 84 y.o. year old female with end stage OA of her left knee with progressively worsening pain and dysfunction. She has constant pain, with activity and at rest and significant functional deficits with difficulties even with ADLs. She has had extensive non-op management including analgesics, injections of cortisone and viscosupplements, and home exercise program, but remains in significant pain with significant dysfunction.  Radiographs show bone on bone arthritis lateral and patellofemoral. She presents now for left Total Knee Arthroplasty.   Laboratory Data: Admission on 01/29/2018  Component Date Value Ref Range Status  . WBC 01/30/2018 12.2* 4.0 - 10.5 K/uL Final  . RBC 01/30/2018 3.51* 3.87 - 5.11 MIL/uL Final  . Hemoglobin 01/30/2018 11.1* 12.0 - 15.0 g/dL Final  . HCT 01/30/2018 34.2* 36.0 - 46.0 % Final  . MCV 01/30/2018 97.4  78.0 - 100.0 fL Final  . MCH 01/30/2018 31.6  26.0 - 34.0 pg Final  . MCHC 01/30/2018 32.5  30.0 - 36.0 g/dL Final  . RDW 01/30/2018 14.4  11.5 - 15.5 % Final  . Platelets 01/30/2018 378  150 - 400 K/uL Final   Performed at [facility], [place] [address] [place], [place] [address]  . Sodium 01/30/2018 139  135 - 145 mmol/L Final  . Potassium 01/30/2018 3.8  3.5 - 5.1 mmol/L Final  . Chloride 01/30/2018 102  98 - 111 mmol/L Final  . CO2 01/30/2018 27  22 - 32 mmol/L Final  . Glucose, Bld 01/30/2018 164* 70 - 99 mg/dL Final  . BUN 01/30/2018 9  8 - 23 mg/dL Final  . Creatinine, Ser 01/30/2018 0.56  0.44 - 1.00 mg/dL Final  . Calcium 01/30/2018 8.8* 8.9 - 10.3 mg/dL Final  . GFR calc non Af Amer 01/30/2018 >60  >60 mL/min Final  . GFR calc Af Wyvonnia Lora  01/30/2018 >60  >60 mL/min Final   Comment: (NOTE) The eGFR has been calculated using the CKD EPI equation. This calculation has not been validated in all clinical situations. eGFR's persistently <60 mL/min signify possible Chronic Kidney Disease.   Georgiann Hahn gap 01/30/2018 10  5 - 15 Final   Performed at [facility], [place] [address], [place], [place] [address]  . WBC 01/31/2018 9.9  4.0 - 10.5 K/uL Final  . RBC 01/31/2018 3.26* 3.87 - 5.11 MIL/uL Final  . Hemoglobin 01/31/2018 10.4* 12.0 - 15.0 g/dL Final  . HCT 01/31/2018 31.5* 36.0 - 46.0 % Final  . MCV 01/31/2018 96.6  78.0 - 100.0 fL Final  . MCH 01/31/2018 31.9  26.0 - 34.0 pg Final  . MCHC 01/31/2018 33.0  30.0 - 36.0 g/dL Final  . RDW  01/31/2018 14.3  11.5 - 15.5 % Final  . Platelets 01/31/2018 362  150 - 400 K/uL Final   Performed at [facility], [place] [address], [place], [place] [address]  . Sodium 01/31/2018 140  135 - 145 mmol/L Final  . Potassium 01/31/2018 3.3* 3.5 - 5.1 mmol/L Final  . Chloride 01/31/2018 103  98 - 111 mmol/L Final  . CO2 01/31/2018 28  22 - 32 mmol/L Final  . Glucose, Bld 01/31/2018 139* 70 - 99 mg/dL Final  . BUN 01/31/2018 7* 8 - 23 mg/dL Final  . Creatinine, Ser 01/31/2018 0.46  0.44 - 1.00 mg/dL Final  . Calcium 01/31/2018 9.1  8.9 - 10.3 mg/dL Final  . GFR calc non Af Amer 01/31/2018 >60  >60 mL/min Final  . GFR calc Af Amer 01/31/2018 >60  >60 mL/min Final   Comment: (NOTE) The eGFR has been calculated using the CKD EPI equation. This calculation has not been validated in all clinical situations. eGFR's persistently <60 mL/min signify possible Chronic Kidney Disease.   Georgiann Hahn gap 01/31/2018 9  5 - 15 Final   Performed at [facility], [place] [address] [place], [place] [address]  Hospital Outpatient Visit on 01/23/2018  Component Date Value Ref Range Status  . aPTT 01/23/2018 30  24 - 36 seconds Final   Performed at [facility], [place] [address] [place], [place] [address]  . WBC 01/23/2018 6.9  4.0 - 10.5 K/uL Final  . RBC 01/23/2018 4.18  3.87 - 5.11 MIL/uL Final  . Hemoglobin 01/23/2018 13.3  12.0 - 15.0 g/dL Final  . HCT 01/23/2018 40.0  36.0 - 46.0 % Final  . MCV 01/23/2018 95.7  78.0 - 100.0 fL Final  . MCH 01/23/2018 31.8  26.0 - 34.0 pg Final  . MCHC 01/23/2018 33.3  30.0 - 36.0 g/dL Final  . RDW 01/23/2018 14.3  11.5 - 15.5 % Final  . Platelets 01/23/2018 419* 150 - 400 K/uL Final   Performed at [facility], [place] [address], [place], [place] [address]  . Sodium 01/23/2018 137  135 - 145 mmol/L Final  . Potassium 01/23/2018 4.3  3.5 - 5.1 mmol/L Final  . Chloride 01/23/2018 100  98 - 111 mmol/L  Final  . CO2 01/23/2018 28  22 - 32 mmol/L Final  . Glucose, Bld 01/23/2018 126* 70 - 99 mg/dL Final  . BUN 01/23/2018 12  8 - 23 mg/dL Final  . Creatinine, Ser 01/23/2018 0.66  0.44 - 1.00 mg/dL Final  . Calcium 01/23/2018 9.6  8.9 - 10.3 mg/dL Final  . Total Protein 01/23/2018 7.0  6.5 -  8.1 g/dL Final  . Albumin 01/23/2018 4.3  3.5 - 5.0 g/dL Final  . AST 01/23/2018 22  15 - 41 U/L Final  . ALT 01/23/2018 16  0 - 44 U/L Final  . Alkaline Phosphatase 01/23/2018 109  38 - 126 U/L Final  . Total Bilirubin 01/23/2018 1.0  0.3 - 1.2 mg/dL Final  . GFR calc non Af Amer 01/23/2018 >60  >60 mL/min Final  . GFR calc Af Amer 01/23/2018 >60  >60 mL/min Final   Comment: (NOTE) The eGFR has been calculated using the CKD EPI equation. This calculation has not been validated in all clinical situations. eGFR's persistently <60 mL/min signify possible Chronic Kidney Disease.   Georgiann Hahn gap 01/23/2018 9  5 - 15 Final   Performed at [facility], [place] [address] [place], [place] [address]  . Prothrombin Time 01/23/2018 11.9  11.4 - 15.2 seconds Final  . INR 01/23/2018 0.89   Final   Performed at [facility], [place] [address] [place], [place] [address]  . ABO/RH(D) 01/23/2018 O POS   Final  . Antibody Screen 01/23/2018 NEG   Final  . Sample Expiration 01/23/2018 02/01/2018   Final  . Extend sample reason 01/23/2018    Final                   Value:NO TRANSFUSIONS OR PREGNANCY IN THE PAST 3 MONTHS Performed at [facility], [place] [address], [place], [place] [address]   . MRSA, PCR 01/23/2018 NEGATIVE  NEGATIVE Final  . Staphylococcus aureus 01/23/2018 NEGATIVE  NEGATIVE Final   Comment: (NOTE) The Xpert SA Assay (FDA approved for NASAL specimens in patients 76 years of age and older), is one component of a comprehensive surveillance program. It is not intended to diagnose infection nor to guide or monitor treatment. Performed at [facility], [place] [address], [place], [place] [address]   . ABO/RH(D) 01/23/2018    Final                   Value:O POS Performed at [facility], [place] [address], [place], [place] [address]      X-Rays:Ct Abdomen Pelvis W Contrast  Result Date: 01/03/2018 CLINICAL DATA:  Pelvic and umbilical pain.  Nausea, vomiting EXAM: CT ABDOMEN AND PELVIS WITH CONTRAST TECHNIQUE: Multidetector CT imaging of the abdomen and pelvis was performed using the standard protocol following bolus administration of intravenous contrast. CONTRAST:  138m ISOVUE-300 IOPAMIDOL (ISOVUE-300) INJECTION 61% COMPARISON:  12/16/2016 FINDINGS: Lower chest: No acute abnormality. Hepatobiliary: No focal liver abnormality is seen. Low attenuation on either side of the falciform ligament likely reflecting focal fatty deposition. Status post cholecystectomy. No biliary dilatation. Pancreas: Unremarkable. No pancreatic ductal dilatation or surrounding inflammatory changes. Spleen: Normal in size without focal abnormality. Adrenals/Urinary Tract: Adrenal glands are unremarkable. Nonobstructing right nephrolithiasis. No hydronephrosis. No renal mass. Bladder is unremarkable. Stomach/Bowel: Moderate hiatal hernia. Stomach is otherwise within normal limits. No evidence of bowel wall thickening, distention, or inflammatory changes. Diverticulosis without evidence of diverticulitis. Vascular/Lymphatic: Aortic atherosclerosis. No enlarged abdominal or pelvic lymph nodes. Reproductive: Uterus and bilateral adnexa are unremarkable. Other: No abdominal wall hernia or abnormality. No abdominopelvic ascites. Musculoskeletal: No acute osseous abnormality. No aggressive osseous lesion. L1 vertebral body compression fracture approximately 50% height loss unchanged from 12/16/2016. moderate-severe osteoarthritis of the left hip. IMPRESSION: 1. No acute abdominal or pelvic pathology. 2.  Aortic Atherosclerosis (ICD10-I70.0). 3.  Nonobstructing right  nephrolithiasis. Electronically Signed   By: Kathreen Devoid   On: 01/03/2018 08:04    EKG: Orders placed or performed during the hospital encounter of 01/23/18  . EKG 12-Lead  . EKG 12-Lead     Hospital Course: Diane Chambers is a 84 y.o. who was admitted to [facility]. They were brought to the operating room on 01/29/2018 and underwent Procedure(s): LEFT TOTAL KNEE ARTHROPLASTY.  Patient tolerated the procedure well and was later transferred to the recovery room and then to the orthopaedic floor for postoperative care. They were given PO and IV analgesics for pain control following their surgery. They were given 24 hours of postoperative antibiotics of  Anti-infectives (From admission, onward)   Start     Dose/Rate Route Frequency Ordered Stop   01/29/18 1845  ceFAZolin (ANCEF) IVPB 2g/100 mL premix     2 g 200 mL/hr over 30 Minutes Intravenous Every 6 hours 01/29/18 1548 01/30/18 0035   01/29/18 1030  ceFAZolin (ANCEF) IVPB 2g/100 mL premix     2 g 200 mL/hr over 30 Minutes Intravenous On call to O.R. 01/29/18 1018 01/29/18 1251     and started on DVT prophylaxis in the form of Aspirin.   PT and OT were ordered for total joint protocol. Discharge planning consulted to help with postop disposition and equipment needs. Patient had a decent night on the evening of surgery. They started to get up OOB with therapy on POD #0. Hemovac drain was pulled without difficulty on day one. Continued to work with therapy into POD #2. Pt was seen during rounds on day two and was ready for discharge to SNF. Pt had some issues with generalized itching after taking oxycodone tablets. Benadryl and vistaril given and PO pain medication changed to hydromorphone. Pt reported itching improved after changes. Potassium was noted to be low at 3.3, two doses of 40 mEq KCl were ordered PO. Dressing was changed and the incision was clean, dry, and intact with no drainage. Pt continued to work with  therapy on POD #2 and was ready for discharge pending bed availability at skilled nursing facility. She was discharged in stable condition.   Diet: Cardiac diet Activity: WBAT Follow-up: in 2 weeks with Dr. Wynelle Link Disposition: Skilled nursing facility Discharged Condition: stable   Discharge Instructions    Call MD / Call 911   Complete by:  As directed    If you experience chest pain or shortness of breath, CALL 911 and be transported to the hospital emergency room.  If you develope a fever above 101 F, pus (white drainage) or increased drainage or redness at the wound, or calf pain, call your surgeon's office.   Change dressing   Complete by:  As directed    Change the dressing daily with sterile 4 x 4 inch gauze dressing and apply TED hose.   Constipation Prevention   Complete by:  As directed    Drink plenty of fluids.  Prune juice may be helpful.  You may use a stool softener, such as Colace (over the counter) 100 mg twice a day.  Use MiraLax (over the counter) for constipation as needed.   Diet - low sodium heart healthy   Complete by:  As directed    Discharge instructions   Complete by:  As directed    Dr. Gaynelle Arabian Total Joint Specialist [facility] [address] [place], [place] [address] [phone]  TOTAL KNEE REPLACEMENT POSTOPERATIVE DIRECTIONS  Knee Rehabilitation, Guidelines  Following Surgery  Results after knee surgery are often greatly improved when you follow the exercise, range of motion and muscle strengthening exercises prescribed by your doctor. Safety measures are also important to protect the knee from further injury. Any time any of these exercises cause you to have increased pain or swelling in your knee joint, decrease the amount until you are comfortable again and slowly increase them. If you have problems or questions, call your caregiver or physical therapist for advice.   HOME CARE INSTRUCTIONS  Remove items at home which could  result in a fall. This includes throw rugs or furniture in walking pathways.  ICE to the affected knee every three hours for 30 minutes at a time and then as needed for pain and swelling.  Continue to use ice on the knee for pain and swelling from surgery. You may notice swelling that will progress down to the foot and ankle.  This is normal after surgery.  Elevate the leg when you are not up walking on it.   Continue to use the breathing machine which will help keep your temperature down.  It is common for your temperature to cycle up and down following surgery, especially at night when you are not up moving around and exerting yourself.  The breathing machine keeps your lungs expanded and your temperature down. Do not place pillow under knee, focus on keeping the knee straight while resting   DIET You may resume your previous home diet once your are discharged from the hospital.  DRESSING / WOUND CARE / SHOWERING You may shower 3 days after surgery, but keep the wounds dry during showering.  You may use an occlusive plastic wrap (Press'n Seal for example), NO SOAKING/SUBMERGING IN THE BATHTUB.  If the bandage gets wet, change with a clean dry gauze.  If the incision gets wet, pat the wound dry with a clean towel. You may start showering once you are discharged home but do not submerge the incision under water. Just pat the incision dry and apply a dry gauze dressing on daily. Change the surgical dressing daily and reapply a dry dressing each time.  ACTIVITY Walk with your walker as instructed. Use walker as long as suggested by your caregivers. Avoid periods of inactivity such as sitting longer than an hour when not asleep. This helps prevent blood clots.  You may resume a sexual relationship in one month or when given the OK by your doctor.  You may return to work once you are cleared by your doctor.  Do not drive a car for 6 weeks or until released by you surgeon.  Do not drive while taking  narcotics.  WEIGHT BEARING Weight bearing as tolerated with assist device (walker, cane, etc) as directed, use it as long as suggested by your surgeon or therapist, typically at least 4-6 weeks.  POSTOPERATIVE CONSTIPATION PROTOCOL Constipation - defined medically as fewer than three stools per week and severe constipation as less than one stool per week.  One of the most common issues patients have following surgery is constipation.  Even if you have a regular bowel pattern at home, your normal regimen is likely to be disrupted due to multiple reasons following surgery.  Combination of anesthesia, postoperative narcotics, change in appetite and fluid intake all can affect your bowels.  In order to avoid complications following surgery, here are some recommendations in order to help you during your recovery period.  Colace (docusate) - Pick up an over-the-counter form of  Colace or another stool softener and take twice a day as long as you are requiring postoperative pain medications.  Take with a full glass of water daily.  If you experience loose stools or diarrhea, hold the colace until you stool forms back up.  If your symptoms do not get better within 1 week or if they get worse, check with your doctor.  Dulcolax (bisacodyl) - Pick up over-the-counter and take as directed by the product packaging as needed to assist with the movement of your bowels.  Take with a full glass of water.  Use this product as needed if not relieved by Colace only.   MiraLax (polyethylene glycol) - Pick up over-the-counter to have on hand.  MiraLax is a solution that will increase the amount of water in your bowels to assist with bowel movements.  Take as directed and can mix with a glass of water, juice, soda, coffee, or tea.  Take if you go more than two days without a movement. Do not use MiraLax more than once per day. Call your doctor if you are still constipated or irregular after using this medication for 7 days  in a row.  If you continue to have problems with postoperative constipation, please contact the office for further assistance and recommendations.  If you experience "the worst abdominal pain ever" or develop nausea or vomiting, please contact the office immediatly for further recommendations for treatment.  ITCHING  If you experience itching with your medications, try taking only a single pain pill, or even half a pain pill at a time.  You can also use Benadryl over the counter for itching or also to help with sleep.   TED HOSE STOCKINGS Wear the elastic stockings on both legs for three weeks following surgery during the day but you may remove then at night for sleeping.  MEDICATIONS See your medication summary on the "After Visit Summary" that the nursing staff will review with you prior to discharge.  You may have some home medications which will be placed on hold until you complete the course of blood thinner medication.  It is important for you to complete the blood thinner medication as prescribed by your surgeon.  Continue your approved medications as instructed at time of discharge.  PRECAUTIONS If you experience chest pain or shortness of breath - call 911 immediately for transfer to the hospital emergency department.  If you develop a fever greater that 101 F, purulent drainage from wound, increased redness or drainage from wound, foul odor from the wound/dressing, or calf pain - CONTACT YOUR SURGEON.                                                   FOLLOW-UP APPOINTMENTS Make sure you keep all of your appointments after your operation with your surgeon and caregivers. You should call the office at the above phone number and make an appointment for approximately two weeks after the date of your surgery or on the date instructed by your surgeon outlined in the "After Visit Summary".   RANGE OF MOTION AND STRENGTHENING EXERCISES  Rehabilitation of the knee is important following a knee  injury or an operation. After just a few days of immobilization, the muscles of the thigh which control the knee become weakened and shrink (atrophy). Knee exercises are designed to build up  the tone and strength of the thigh muscles and to improve knee motion. Often times heat used for twenty to thirty minutes before working out will loosen up your tissues and help with improving the range of motion but do not use heat for the first two weeks following surgery. These exercises can be done on a training (exercise) mat, on the floor, on a table or on a bed. Use what ever works the best and is most comfortable for you Knee exercises include:  Leg Lifts - While your knee is still immobilized in a splint or cast, you can do straight leg raises. Lift the leg to 60 degrees, hold for 3 sec, and slowly lower the leg. Repeat 10-20 times 2-3 times daily. Perform this exercise against resistance later as your knee gets better.  Quad and Hamstring Sets - Tighten up the muscle on the front of the thigh (Quad) and hold for 5-10 sec. Repeat this 10-20 times hourly. Hamstring sets are done by pushing the foot backward against an object and holding for 5-10 sec. Repeat as with quad sets.  Leg Slides: Lying on your back, slowly slide your foot toward your buttocks, bending your knee up off the floor (only go as far as is comfortable). Then slowly slide your foot back down until your leg is flat on the floor again. Angel Wings: Lying on your back spread your legs to the side as far apart as you can without causing discomfort.  A rehabilitation program following serious knee injuries can speed recovery and prevent re-injury in the future due to weakened muscles. Contact your doctor or a physical therapist for more information on knee rehabilitation.   IF YOU ARE TRANSFERRED TO A SKILLED REHAB FACILITY If the patient is transferred to a skilled rehab facility following release from the hospital, a list of the current medications  will be sent to the facility for the patient to continue.  When discharged from the skilled rehab facility, please have the facility set up the patient's [place] prior to being released. Also, the skilled facility will be responsible for providing the patient with their medications at time of release from the facility to include their pain medication, the muscle relaxants, and their blood thinner medication. If the patient is still at the rehab facility at time of the two week follow up appointment, the skilled rehab facility will also need to assist the patient in arranging follow up appointment in our office and any transportation needs.  MAKE SURE YOU:  Understand these instructions.  Get help right away if you are not doing well or get worse.    Pick up stool softner and laxative for home use following surgery while on pain medications. Do not submerge incision under water. Please use good hand washing techniques while changing dressing each day. May shower starting three days after surgery. Please use a clean towel to pat the incision dry following showers. Continue to use ice for pain and swelling after surgery. Do not use any lotions or creams on the incision until instructed by your surgeon.   Do not put a pillow under the knee. Place it under the heel.   Complete by:  As directed    Driving restrictions   Complete by:  As directed    No driving for two weeks   TED hose   Complete by:  As directed    Use stockings (TED hose) for three weeks on both leg(s).  You may remove  them at night for sleeping.   Weight bearing as tolerated   Complete by:  As directed      Allergies as of 01/31/2018      Reactions   Calcium-containing Compounds Other (See Comments)   Restless legs   Lactose Intolerance (gi) Diarrhea      Medication List    STOP taking these medications   aspirin 325 MG tablet Replaced by:  aspirin 325 MG EC tablet   CoQ10 100 MG Caps     Glucosamine 750 MG Tabs   HYALURONIC ACID PO   ibuprofen 200 MG tablet Commonly known as:  ADVIL,MOTRIN   Melatonin 10 MG Tabs   PRESERVISION AREDS 2 Caps   vitamin B-12 500 MCG tablet Commonly known as:  CYANOCOBALAMIN   vitamin C 500 MG tablet Commonly known as:  ASCORBIC ACID     TAKE these medications   AMBIEN 10 MG tablet Generic drug:  zolpidem Take 5 mg by mouth at bedtime as needed for sleep.   amLODipine 5 MG tablet Commonly known as:  NORVASC Take 5 mg by mouth every morning.   aspirin 325 MG EC tablet Take 1 tablet (325 mg total) by mouth 2 (two) times daily for 19 days. Then resume one 325 mg aspirin once a day. Replaces:  aspirin 325 MG tablet   atorvastatin 20 MG tablet Commonly known as:  LIPITOR Take 20 mg by mouth at bedtime.   docusate sodium 100 MG capsule Commonly known as:  COLACE Take 1 capsule (100 mg total) by mouth 2 (two) times daily.   HYDROmorphone 2 MG tablet Commonly known as:  DILAUDID Take 1-2 tablets (2-4 mg total) by mouth every 6 (six) hours as needed for moderate pain or severe pain (refractory to oxycodone).   levothyroxine 112 MCG tablet Commonly known as:  SYNTHROID, LEVOTHROID Take 112 mcg by mouth daily before breakfast.   losartan 100 MG tablet Commonly known as:  COZAAR Take 100 mg by mouth every morning.   methocarbamol 500 MG tablet Commonly known as:  ROBAXIN Take 1 tablet (500 mg total) by mouth every 6 (six) hours as needed for muscle spasms.   omeprazole 20 MG capsule Commonly known as:  PRILOSEC Take 20 mg by mouth 2 (two) times daily before a meal.   ondansetron 4 MG tablet Commonly known as:  ZOFRAN Take 1 tablet (4 mg total) by mouth every 6 (six) hours as needed for nausea.   polyethylene glycol packet Commonly known as:  MIRALAX / GLYCOLAX Take 17 g by mouth daily as needed for mild constipation.   REFRESH 1.4-0.6 % Soln Generic drug:  Polyvinyl Alcohol-Povidone PF Place 1 drop into both eyes  4 (four) times daily.   sertraline 50 MG tablet Commonly known as:  ZOLOFT Take 50 mg by mouth at bedtime.   traZODone 50 MG tablet Commonly known as:  DESYREL Take 0.5 tablets (25 mg total) by mouth at bedtime as needed for sleep. What changed:    how much to take  when to take this            Discharge Care Instructions  (From admission, onward)         Start     Ordered   01/31/18 0000  Weight bearing as tolerated     01/31/18 0744   01/31/18 0000  Change dressing    Comments:  Change the dressing daily with sterile 4 x 4 inch gauze dressing and apply TED hose.  01/31/18 0744         Follow-up Information    Gaynelle Arabian, MD. Schedule an appointment as soon as possible for a visit on 02/13/2018.   Specialty:  Orthopedic Surgery Contact information: [address] [place] [address] [phone]           Signed: Theresa Duty, PA-C Orthopedic Surgery 01/31/2018, 7:44 AM

## 2018-01-31 NOTE — Progress Notes (Signed)
Physical Therapy Treatment Patient Details Name: Diane Chambers MRN: [id number] DOB: 1933-08-04 Today's Date: 01/31/2018    History of Present Illness 84 YO female s/p L TKR on 9/9. PMH includes colitis, TIAs, HTN, cholecystectomy, R TKR.     PT Comments    POD # 2 am session Pt c/o increased knee pain > yesterday.  Required increased time to get OOB to bathroom.  Required assist for balance turning to toilet.  Amb a limited distance.  Unsteady gait with decreased self WBing tolerance l LE due to pain.   Pt will need ST Rehab at SNF prior to D/C to home.   Follow Up Recommendations  Follow surgeon's recommendation for DC plan and follow-up therapies;Supervision for mobility/OOB;SNF     Equipment Recommendations  None recommended by PT    Recommendations for Other Services       Precautions / Restrictions Precautions Precautions: Fall;Knee Precaution Comments: did not use KI this session Restrictions Weight Bearing Restrictions: No Other Position/Activity Restrictions: WBAT     Mobility  Bed Mobility Overal bed mobility: Needs Assistance Bed Mobility: Supine to Sit     Supine to sit: Min assist     General bed mobility comments: assist L LE off bed and on with increased time due to increased pain level  Transfers Overall transfer level: Needs assistance Equipment used: Rolling walker (2 wheeled) Transfers: Sit to/from [facility] Sit to Stand: Min guard Stand pivot transfers: Min guard       General transfer comment: 50% VC's on proper hand placement and safety with turns   Also assisted with toilet transfer  Ambulation/Gait Ambulation/Gait assistance: Min assist Gait Distance (Feet): 22 Feet Assistive device: Rolling walker (2 wheeled) Gait Pattern/deviations: Step-to pattern;Decreased stance time - left;Decreased weight shift to left;Antalgic Gait velocity: decreased   General Gait Details: very unsteady gait with limited amb distance due to  pain leve  and instbility.  HIGH FALL RISK   impaired safety cognition   Stairs             Wheelchair Mobility    Modified Rankin (Stroke Patients Only)       Balance                                            Cognition Arousal/Alertness: Awake/alert Behavior During Therapy: WFL for tasks assessed/performed Overall Cognitive Status: Within Functional Limits for tasks assessed                                 General Comments: hurting more today      Exercises      General Comments        Pertinent Vitals/Pain Pain Assessment: 0-10 Pain Score: 7  Pain Location: L knee Pain Descriptors / Indicators: Burning;Sore;Operative site guarding Pain Intervention(s): Monitored during session;Repositioned;Ice applied    Home Living                      Prior Function            PT Goals (current goals can now be found in the care plan section) Progress towards PT goals: Progressing toward goals    Frequency    7X/week      PT Plan Current plan remains appropriate    Co-evaluation  AM-PAC PT "6 Clicks" Daily Activity  Outcome Measure  Difficulty turning over in bed (including adjusting bedclothes, sheets and blankets)?: A Lot Difficulty moving from lying on back to sitting on the side of the bed? : A Lot Difficulty sitting down on and standing up from a chair with arms (e.g., wheelchair, bedside commode, etc,.)?: A Lot Help needed moving to and from a bed to chair (including a wheelchair)?: A Lot Help needed walking in hospital room?: A Lot Help needed climbing 3-5 steps with a railing? : A Lot 6 Click Score: 12    End of Session Equipment Utilized During Treatment: Gait belt;Other (comment) Activity Tolerance: Patient limited by pain Patient left: in chair;with call bell/phone within reach Nurse Communication: Mobility status PT Visit Diagnosis: Difficulty in walking, not elsewhere  classified (R26.2);Unsteadiness on feet (R26.81)     Time: 3235-5732 PT Time Calculation (min) (ACUTE ONLY): 28 min  Charges:  $Gait Training: 8-22 mins $Therapeutic Activity: 8-22 mins                     Rica Koyanagi  PTA Acute  Rehabilitation Services Pager      [phone] Office      [phone]

## 2018-01-31 NOTE — Clinical Social Work Placement (Signed)
   CLINICAL SOCIAL WORK PLACEMENT  NOTE  Date:  01/31/2018  Patient Details  Name: Diane Chambers MRN: [id number] Date of Birth: [birth year]  Clinical Social Work is seeking post-discharge placement for this patient at the [place] level of care (*CSW will initial, date and re-position this form in  chart as items are completed):  Yes   Patient/family provided with [place] Work Department's list of facilities offering this level of care within the geographic area requested by the patient (or if unable, by the patient's family).  Yes   Patient/family informed of their freedom to choose among providers that offer the needed level of care, that participate in Medicare, Medicaid or managed care program needed by the patient, have an available bed and are willing to accept the patient.  Yes   Patient/family informed of 's ownership interest in [facility] and [facility], as well as of the fact that they are under no obligation to receive care at these facilities.  PASRR submitted to EDS on 01/31/18     PASRR number received on 01/31/18     Existing PASRR number confirmed on       FL2 transmitted to all facilities in geographic area requested by pt/family on       FL2 transmitted to all facilities within larger geographic area on 01/30/18     Patient informed that his/her managed care company has contracts with or will negotiate with certain facilities, including the following:  [facility]         Patient/family informed of bed offers received.  Patient chooses bed at [facility]     Physician recommends and patient chooses bed at      Patient to be transferred to [facility] on 01/31/18.  Patient to be transferred to facility by       Patient family notified on 01/31/18 of transfer.  Name of family member notified:  Son-Micheal      PHYSICIAN       Additional Comment:     _______________________________________________ Lia Hopping, LCSW 01/31/2018, 4:00 PM

## 2018-01-31 NOTE — Plan of Care (Signed)
Pt is stable. Pain management in progress, effective.

## 2018-01-31 NOTE — Progress Notes (Signed)
Attempted report to [employer].  [phone].  No answer.

## 2018-02-01 DIAGNOSIS — K5909 Other constipation: Secondary | ICD-10-CM | POA: Diagnosis not present

## 2018-02-01 DIAGNOSIS — T402X5A Adverse effect of other opioids, initial encounter: Secondary | ICD-10-CM | POA: Diagnosis not present

## 2018-02-01 DIAGNOSIS — Z96652 Presence of left artificial knee joint: Secondary | ICD-10-CM | POA: Diagnosis not present

## 2018-02-02 DIAGNOSIS — E039 Hypothyroidism, unspecified: Secondary | ICD-10-CM | POA: Diagnosis not present

## 2018-02-02 DIAGNOSIS — Z96652 Presence of left artificial knee joint: Secondary | ICD-10-CM | POA: Diagnosis not present

## 2018-02-02 DIAGNOSIS — K219 Gastro-esophageal reflux disease without esophagitis: Secondary | ICD-10-CM | POA: Diagnosis not present

## 2018-02-02 DIAGNOSIS — I10 Essential (primary) hypertension: Secondary | ICD-10-CM | POA: Diagnosis not present

## 2018-02-09 DIAGNOSIS — I10 Essential (primary) hypertension: Secondary | ICD-10-CM | POA: Diagnosis not present

## 2018-02-09 DIAGNOSIS — M179 Osteoarthritis of knee, unspecified: Secondary | ICD-10-CM | POA: Diagnosis not present

## 2018-02-09 DIAGNOSIS — K219 Gastro-esophageal reflux disease without esophagitis: Secondary | ICD-10-CM | POA: Diagnosis not present

## 2018-02-09 DIAGNOSIS — E039 Hypothyroidism, unspecified: Secondary | ICD-10-CM | POA: Diagnosis not present

## 2018-02-09 DIAGNOSIS — G2581 Restless legs syndrome: Secondary | ICD-10-CM | POA: Diagnosis not present

## 2018-02-13 DIAGNOSIS — I6529 Occlusion and stenosis of unspecified carotid artery: Secondary | ICD-10-CM | POA: Diagnosis not present

## 2018-02-13 DIAGNOSIS — M179 Osteoarthritis of knee, unspecified: Secondary | ICD-10-CM | POA: Diagnosis not present

## 2018-02-14 ENCOUNTER — Other Ambulatory Visit: Payer: Self-pay

## 2018-02-14 DIAGNOSIS — M25662 Stiffness of left knee, not elsewhere classified: Secondary | ICD-10-CM | POA: Diagnosis not present

## 2018-02-14 NOTE — Patient Outreach (Signed)
[facility]) Care Management  02/14/2018  Diane Chambers Dec 20, 1933 [id number]  EMMI: general discharge  Referral date: 02/14/18 Referral reason: lost interest in  things Insurance: Medicare Day # 4   Telephone call to patient regarding [place] discharge red alert.  HIPAA verified with patient. Explained reason for call. Patient states she is not having any lost of interest in things. Patient states she is doing well.  She states she has her medications and takes them as prescribed.  Patient reports she is going to outpatient therapy.  Patient states she saw the surgeon on yesterday and her incision is looking and healing fine. Patient states she has transportation to her appointments. Patient states she has friends that check on her.  RNCM discussed signs and symptoms of infections.  RNCM discussed fall safety precautions. Patient states when she was in inpatient rehab the therapist came out to her home and moved furniture to make sure the home was safe for ambulation.  Patient states she continues to ambulate with her walker.  Patient denies any further needs or concerns.  RNCM advised patient to notify MD of any changes in condition prior to scheduled appointment. RNCM provided contact name and number: for 24 hour nurse advise line [phone].  RNCM verified patient aware of 911 services for urgent/ emergent needs.   PLAN; RNCM will close patient due to patient being assessed and having no further needs.  RNCM will send patients primary MD closure notification.   Quinn Plowman RN,BSN,CCM [facility] Telephonic  [phone]

## 2018-02-20 DIAGNOSIS — M25662 Stiffness of left knee, not elsewhere classified: Secondary | ICD-10-CM | POA: Diagnosis not present

## 2018-02-22 DIAGNOSIS — S92354A Nondisplaced fracture of fifth metatarsal bone, right foot, initial encounter for closed fracture: Secondary | ICD-10-CM | POA: Diagnosis not present

## 2018-02-22 DIAGNOSIS — M79671 Pain in right foot: Secondary | ICD-10-CM | POA: Diagnosis not present

## 2018-02-23 DIAGNOSIS — M25662 Stiffness of left knee, not elsewhere classified: Secondary | ICD-10-CM | POA: Diagnosis not present

## 2018-02-26 DIAGNOSIS — H353123 Nonexudative age-related macular degeneration, left eye, advanced atrophic without subfoveal involvement: Secondary | ICD-10-CM | POA: Diagnosis not present

## 2018-02-26 DIAGNOSIS — H353211 Exudative age-related macular degeneration, right eye, with active choroidal neovascularization: Secondary | ICD-10-CM | POA: Diagnosis not present

## 2018-02-26 DIAGNOSIS — H43813 Vitreous degeneration, bilateral: Secondary | ICD-10-CM | POA: Diagnosis not present

## 2018-02-26 DIAGNOSIS — H35033 Hypertensive retinopathy, bilateral: Secondary | ICD-10-CM | POA: Diagnosis not present

## 2018-02-28 DIAGNOSIS — M25662 Stiffness of left knee, not elsewhere classified: Secondary | ICD-10-CM | POA: Diagnosis not present

## 2018-03-02 DIAGNOSIS — M25662 Stiffness of left knee, not elsewhere classified: Secondary | ICD-10-CM | POA: Diagnosis not present

## 2018-03-05 DIAGNOSIS — M25662 Stiffness of left knee, not elsewhere classified: Secondary | ICD-10-CM | POA: Diagnosis not present

## 2018-03-06 DIAGNOSIS — Z96652 Presence of left artificial knee joint: Secondary | ICD-10-CM | POA: Diagnosis not present

## 2018-03-06 DIAGNOSIS — M1712 Unilateral primary osteoarthritis, left knee: Secondary | ICD-10-CM | POA: Diagnosis not present

## 2018-03-09 DIAGNOSIS — M25662 Stiffness of left knee, not elsewhere classified: Secondary | ICD-10-CM | POA: Diagnosis not present

## 2018-03-12 DIAGNOSIS — M25662 Stiffness of left knee, not elsewhere classified: Secondary | ICD-10-CM | POA: Diagnosis not present

## 2018-03-19 DIAGNOSIS — M25662 Stiffness of left knee, not elsewhere classified: Secondary | ICD-10-CM | POA: Diagnosis not present

## 2018-03-22 DIAGNOSIS — M25662 Stiffness of left knee, not elsewhere classified: Secondary | ICD-10-CM | POA: Diagnosis not present

## 2018-03-23 DIAGNOSIS — H353122 Nonexudative age-related macular degeneration, left eye, intermediate dry stage: Secondary | ICD-10-CM | POA: Diagnosis not present

## 2018-04-09 DIAGNOSIS — H35033 Hypertensive retinopathy, bilateral: Secondary | ICD-10-CM | POA: Diagnosis not present

## 2018-04-09 DIAGNOSIS — H353123 Nonexudative age-related macular degeneration, left eye, advanced atrophic without subfoveal involvement: Secondary | ICD-10-CM | POA: Diagnosis not present

## 2018-04-09 DIAGNOSIS — H353211 Exudative age-related macular degeneration, right eye, with active choroidal neovascularization: Secondary | ICD-10-CM | POA: Diagnosis not present

## 2018-04-09 DIAGNOSIS — H43813 Vitreous degeneration, bilateral: Secondary | ICD-10-CM | POA: Diagnosis not present

## 2018-05-21 DIAGNOSIS — H353123 Nonexudative age-related macular degeneration, left eye, advanced atrophic without subfoveal involvement: Secondary | ICD-10-CM | POA: Diagnosis not present

## 2018-05-21 DIAGNOSIS — H43813 Vitreous degeneration, bilateral: Secondary | ICD-10-CM | POA: Diagnosis not present

## 2018-05-21 DIAGNOSIS — H353211 Exudative age-related macular degeneration, right eye, with active choroidal neovascularization: Secondary | ICD-10-CM | POA: Diagnosis not present

## 2018-05-21 DIAGNOSIS — H35033 Hypertensive retinopathy, bilateral: Secondary | ICD-10-CM | POA: Diagnosis not present

## 2018-05-22 DIAGNOSIS — M546 Pain in thoracic spine: Secondary | ICD-10-CM | POA: Diagnosis not present

## 2024-01-14 ENCOUNTER — Inpatient Hospital Stay
Admission: EM | Admit: 2024-01-14 | Discharge: 2024-01-22 | DRG: 871 | Disposition: A | Discharge status: Skilled Nursing Facility | Source: Skilled Nursing Facility | Attending: Internal Medicine | Admitting: Internal Medicine

## 2024-01-14 ENCOUNTER — Emergency Department

## 2024-01-14 ENCOUNTER — Other Ambulatory Visit: Payer: Self-pay

## 2024-01-14 DIAGNOSIS — J40 Bronchitis, not specified as acute or chronic: Secondary | ICD-10-CM | POA: Diagnosis not present

## 2024-01-14 DIAGNOSIS — Z9889 Other specified postprocedural states: Secondary | ICD-10-CM

## 2024-01-14 DIAGNOSIS — J189 Pneumonia, unspecified organism: Secondary | ICD-10-CM

## 2024-01-14 DIAGNOSIS — J123 Human metapneumovirus pneumonia: Secondary | ICD-10-CM | POA: Diagnosis present

## 2024-01-14 DIAGNOSIS — Z888 Allergy status to other drugs, medicaments and biological substances status: Secondary | ICD-10-CM

## 2024-01-14 DIAGNOSIS — Z8619 Personal history of other infectious and parasitic diseases: Secondary | ICD-10-CM

## 2024-01-14 DIAGNOSIS — I6523 Occlusion and stenosis of bilateral carotid arteries: Secondary | ICD-10-CM | POA: Diagnosis present

## 2024-01-14 DIAGNOSIS — Z87891 Personal history of nicotine dependence: Secondary | ICD-10-CM

## 2024-01-14 DIAGNOSIS — Z833 Family history of diabetes mellitus: Secondary | ICD-10-CM

## 2024-01-14 DIAGNOSIS — I482 Chronic atrial fibrillation, unspecified: Secondary | ICD-10-CM | POA: Diagnosis present

## 2024-01-14 DIAGNOSIS — Z1152 Encounter for screening for COVID-19: Secondary | ICD-10-CM | POA: Diagnosis not present

## 2024-01-14 DIAGNOSIS — I878 Other specified disorders of veins: Secondary | ICD-10-CM | POA: Diagnosis present

## 2024-01-14 DIAGNOSIS — E872 Acidosis, unspecified: Secondary | ICD-10-CM | POA: Diagnosis present

## 2024-01-14 DIAGNOSIS — I083 Combined rheumatic disorders of mitral, aortic and tricuspid valves: Secondary | ICD-10-CM | POA: Diagnosis present

## 2024-01-14 DIAGNOSIS — R000 Tachycardia, unspecified: Secondary | ICD-10-CM

## 2024-01-14 DIAGNOSIS — A419 Sepsis, unspecified organism: Secondary | ICD-10-CM | POA: Diagnosis not present

## 2024-01-14 DIAGNOSIS — I38 Endocarditis, valve unspecified: Secondary | ICD-10-CM | POA: Diagnosis present

## 2024-01-14 DIAGNOSIS — Z9049 Acquired absence of other specified parts of digestive tract: Secondary | ICD-10-CM

## 2024-01-14 DIAGNOSIS — I69334 Monoplegia of upper limb following cerebral infarction affecting left non-dominant side: Secondary | ICD-10-CM

## 2024-01-14 DIAGNOSIS — B9562 Methicillin resistant Staphylococcus aureus infection as the cause of diseases classified elsewhere: Secondary | ICD-10-CM

## 2024-01-14 DIAGNOSIS — Z974 Presence of external hearing-aid: Secondary | ICD-10-CM

## 2024-01-14 DIAGNOSIS — J209 Acute bronchitis, unspecified: Secondary | ICD-10-CM | POA: Diagnosis present

## 2024-01-14 DIAGNOSIS — Z66 Do not resuscitate: Secondary | ICD-10-CM | POA: Diagnosis present

## 2024-01-14 DIAGNOSIS — Z79899 Other long term (current) drug therapy: Secondary | ICD-10-CM

## 2024-01-14 DIAGNOSIS — Z96653 Presence of artificial knee joint, bilateral: Secondary | ICD-10-CM | POA: Diagnosis present

## 2024-01-14 DIAGNOSIS — H353210 Exudative age-related macular degeneration, right eye, stage unspecified: Secondary | ICD-10-CM | POA: Diagnosis present

## 2024-01-14 DIAGNOSIS — E739 Lactose intolerance, unspecified: Secondary | ICD-10-CM | POA: Diagnosis present

## 2024-01-14 DIAGNOSIS — A4189 Other specified sepsis: Secondary | ICD-10-CM | POA: Diagnosis present

## 2024-01-14 DIAGNOSIS — Z83438 Family history of other disorder of lipoprotein metabolism and other lipidemia: Secondary | ICD-10-CM

## 2024-01-14 DIAGNOSIS — I10 Essential (primary) hypertension: Secondary | ICD-10-CM | POA: Diagnosis present

## 2024-01-14 DIAGNOSIS — I5A Non-ischemic myocardial injury (non-traumatic): Secondary | ICD-10-CM | POA: Diagnosis present

## 2024-01-14 DIAGNOSIS — E039 Hypothyroidism, unspecified: Secondary | ICD-10-CM | POA: Diagnosis present

## 2024-01-14 DIAGNOSIS — K219 Gastro-esophageal reflux disease without esophagitis: Secondary | ICD-10-CM | POA: Diagnosis present

## 2024-01-14 DIAGNOSIS — Z79890 Hormone replacement therapy (postmenopausal): Secondary | ICD-10-CM

## 2024-01-14 DIAGNOSIS — G2581 Restless legs syndrome: Secondary | ICD-10-CM | POA: Diagnosis present

## 2024-01-14 DIAGNOSIS — Z8719 Personal history of other diseases of the digestive system: Secondary | ICD-10-CM

## 2024-01-14 DIAGNOSIS — I5032 Chronic diastolic (congestive) heart failure: Secondary | ICD-10-CM | POA: Diagnosis present

## 2024-01-14 DIAGNOSIS — Z961 Presence of intraocular lens: Secondary | ICD-10-CM | POA: Diagnosis present

## 2024-01-14 DIAGNOSIS — R6520 Severe sepsis without septic shock: Secondary | ICD-10-CM | POA: Diagnosis present

## 2024-01-14 DIAGNOSIS — E785 Hyperlipidemia, unspecified: Secondary | ICD-10-CM | POA: Diagnosis present

## 2024-01-14 DIAGNOSIS — Z9841 Cataract extraction status, right eye: Secondary | ICD-10-CM

## 2024-01-14 DIAGNOSIS — A4102 Sepsis due to Methicillin resistant Staphylococcus aureus: Principal | ICD-10-CM | POA: Diagnosis present

## 2024-01-14 DIAGNOSIS — R7881 Bacteremia: Secondary | ICD-10-CM | POA: Diagnosis not present

## 2024-01-14 DIAGNOSIS — J208 Acute bronchitis due to other specified organisms: Secondary | ICD-10-CM | POA: Diagnosis present

## 2024-01-14 DIAGNOSIS — Z8614 Personal history of Methicillin resistant Staphylococcus aureus infection: Secondary | ICD-10-CM

## 2024-01-14 DIAGNOSIS — I2489 Other forms of acute ischemic heart disease: Secondary | ICD-10-CM | POA: Diagnosis present

## 2024-01-14 DIAGNOSIS — B9561 Methicillin susceptible Staphylococcus aureus infection as the cause of diseases classified elsewhere: Secondary | ICD-10-CM | POA: Diagnosis not present

## 2024-01-14 DIAGNOSIS — D649 Anemia, unspecified: Secondary | ICD-10-CM | POA: Diagnosis present

## 2024-01-14 DIAGNOSIS — F0150 Vascular dementia without behavioral disturbance: Secondary | ICD-10-CM | POA: Diagnosis present

## 2024-01-14 DIAGNOSIS — H9190 Unspecified hearing loss, unspecified ear: Secondary | ICD-10-CM | POA: Diagnosis present

## 2024-01-14 DIAGNOSIS — I340 Nonrheumatic mitral (valve) insufficiency: Secondary | ICD-10-CM | POA: Diagnosis not present

## 2024-01-14 DIAGNOSIS — Z9842 Cataract extraction status, left eye: Secondary | ICD-10-CM

## 2024-01-14 DIAGNOSIS — Z7901 Long term (current) use of anticoagulants: Secondary | ICD-10-CM

## 2024-01-14 DIAGNOSIS — B9781 Human metapneumovirus as the cause of diseases classified elsewhere: Secondary | ICD-10-CM | POA: Diagnosis not present

## 2024-01-14 DIAGNOSIS — I361 Nonrheumatic tricuspid (valve) insufficiency: Secondary | ICD-10-CM | POA: Diagnosis not present

## 2024-01-14 DIAGNOSIS — I110 Hypertensive heart disease with heart failure: Secondary | ICD-10-CM | POA: Diagnosis present

## 2024-01-14 DIAGNOSIS — Z22322 Carrier or suspected carrier of Methicillin resistant Staphylococcus aureus: Secondary | ICD-10-CM

## 2024-01-14 DIAGNOSIS — Z9089 Acquired absence of other organs: Secondary | ICD-10-CM

## 2024-01-14 DIAGNOSIS — G459 Transient cerebral ischemic attack, unspecified: Secondary | ICD-10-CM | POA: Diagnosis present

## 2024-01-14 DIAGNOSIS — I4891 Unspecified atrial fibrillation: Principal | ICD-10-CM

## 2024-01-14 LAB — T4, FREE: Free T4: 0.94 ng/dL (ref 0.61–1.12)

## 2024-01-14 LAB — CBC WITH DIFFERENTIAL/PLATELET
Abs Immature Granulocytes: 0.04 K/uL (ref 0.00–0.07)
Basophils Absolute: 0.1 K/uL (ref 0.0–0.1)
Basophils Relative: 1 %
Eosinophils Absolute: 0.1 K/uL (ref 0.0–0.5)
Eosinophils Relative: 1 %
HCT: 36 % (ref 36.0–46.0)
Hemoglobin: 11.7 g/dL — ABNORMAL LOW (ref 12.0–15.0)
Immature Granulocytes: 0 %
Lymphocytes Relative: 10 %
Lymphs Abs: 0.9 K/uL (ref 0.7–4.0)
MCH: 31.6 pg (ref 26.0–34.0)
MCHC: 32.5 g/dL (ref 30.0–36.0)
MCV: 97.3 fL (ref 80.0–100.0)
Monocytes Absolute: 1 K/uL (ref 0.1–1.0)
Monocytes Relative: 10 %
Neutro Abs: 7.3 K/uL (ref 1.7–7.7)
Neutrophils Relative %: 78 %
Platelets: 405 K/uL — ABNORMAL HIGH (ref 150–400)
RBC: 3.7 MIL/uL — ABNORMAL LOW (ref 3.87–5.11)
RDW: 16 % — ABNORMAL HIGH (ref 11.5–15.5)
WBC: 9.4 K/uL (ref 4.0–10.5)
nRBC: 0 % (ref 0.0–0.2)

## 2024-01-14 LAB — TROPONIN I (HIGH SENSITIVITY)
Troponin I (High Sensitivity): 35 ng/L — ABNORMAL HIGH (ref <18)
Troponin I (High Sensitivity): 34 ng/L — ABNORMAL HIGH (ref <18)

## 2024-01-14 LAB — COMPREHENSIVE METABOLIC PANEL WITH GFR
ALT: 28 U/L (ref 0–44)
AST: 32 U/L (ref 15–41)
Albumin: 2.9 g/dL — ABNORMAL LOW (ref 3.5–5.0)
Alkaline Phosphatase: 137 U/L — ABNORMAL HIGH (ref 38–126)
Anion gap: 10 (ref 5–15)
BUN: 14 mg/dL (ref 8–23)
CO2: 27 mmol/L (ref 22–32)
Calcium: 8.5 mg/dL — ABNORMAL LOW (ref 8.9–10.3)
Chloride: 98 mmol/L (ref 98–111)
Creatinine, Ser: 0.77 mg/dL (ref 0.44–1.00)
GFR, Estimated: >60 mL/min (ref >60)
Glucose, Bld: 143 mg/dL — ABNORMAL HIGH (ref 70–99)
Potassium: 3.6 mmol/L (ref 3.5–5.1)
Sodium: 135 mmol/L (ref 135–145)
Total Bilirubin: 0.6 mg/dL (ref 0.0–1.2)
Total Protein: 6.8 g/dL (ref 6.5–8.1)

## 2024-01-14 LAB — RESP PANEL BY RT-PCR (RSV, FLU A&B, COVID)  RVPGX2
Influenza A by PCR: NEGATIVE
Influenza B by PCR: NEGATIVE
Resp Syncytial Virus by PCR: NEGATIVE
SARS Coronavirus 2 by RT PCR: NEGATIVE

## 2024-01-14 LAB — APTT: aPTT: 35 s (ref 24–36)

## 2024-01-14 LAB — LACTIC ACID, PLASMA
Lactic Acid, Venous: 2.6 mmol/L (ref 0.5–1.9)
Lactic Acid, Venous: 1.6 mmol/L (ref 0.5–1.9)

## 2024-01-14 LAB — BRAIN NATRIURETIC PEPTIDE: B Natriuretic Peptide: 293.3 pg/mL — ABNORMAL HIGH (ref 0.0–100.0)

## 2024-01-14 LAB — PROTIME-INR
INR: 1.1 (ref 0.8–1.2)
Prothrombin Time: 15.1 s (ref 11.4–15.2)

## 2024-01-14 LAB — LIPASE, BLOOD: Lipase: 28 U/L (ref 11–51)

## 2024-01-14 LAB — TSH: TSH: 1.842 u[IU]/mL (ref 0.350–4.500)

## 2024-01-14 MED ORDER — VITAMIN D 25 MCG (1000 UNIT) PO TABS
1000.0000 [IU] | ORAL_TABLET | Freq: Every day | ORAL | Status: DC
  Administered 2024-01-15 – 2024-01-22 (×8): 1000 [IU] via ORAL
  Filled 2024-01-14 (×8): qty 1

## 2024-01-14 MED ORDER — SODIUM CHLORIDE 0.9 % IV SOLN
500.0000 mg | INTRAVENOUS | Status: DC
  Filled 2024-01-14: qty 5

## 2024-01-14 MED ORDER — GABAPENTIN 100 MG PO CAPS
100.0000 mg | ORAL_CAPSULE | Freq: Three times a day (TID) | ORAL | Status: DC
  Administered 2024-01-14 – 2024-01-22 (×22): 100 mg via ORAL
  Filled 2024-01-14 (×22): qty 1

## 2024-01-14 MED ORDER — TRIAMCINOLONE ACETONIDE 0.1 % EX OINT
1.0000 | TOPICAL_OINTMENT | Freq: Every day | CUTANEOUS | Status: DC
  Administered 2024-01-15 – 2024-01-21 (×7): 1 via TOPICAL
  Filled 2024-01-14: qty 15

## 2024-01-14 MED ORDER — LORATADINE 10 MG PO TABS
10.0000 mg | ORAL_TABLET | Freq: Every day | ORAL | Status: DC
  Administered 2024-01-15 – 2024-01-22 (×8): 10 mg via ORAL
  Filled 2024-01-14 (×8): qty 1

## 2024-01-14 MED ORDER — FUROSEMIDE 40 MG PO TABS
40.0000 mg | ORAL_TABLET | Freq: Every day | ORAL | Status: DC
Start: 2024-01-15 — End: 2024-01-22
  Administered 2024-01-15 – 2024-01-22 (×7): 40 mg via ORAL
  Filled 2024-01-14 (×7): qty 1

## 2024-01-14 MED ORDER — HEPARIN SODIUM (PORCINE) 5000 UNIT/ML IJ SOLN
5000.0000 [IU] | Freq: Three times a day (TID) | INTRAMUSCULAR | Status: DC
  Administered 2024-01-14: 5000 [IU] via SUBCUTANEOUS
  Filled 2024-01-14: qty 1

## 2024-01-14 MED ORDER — DILTIAZEM HCL 25 MG/5ML IV SOLN
15.0000 mg | Freq: Once | INTRAVENOUS | Status: AC
  Administered 2024-01-14: 15 mg via INTRAVENOUS
  Filled 2024-01-14: qty 5

## 2024-01-14 MED ORDER — LEVOTHYROXINE SODIUM 137 MCG PO TABS
137.0000 ug | ORAL_TABLET | Freq: Every morning | ORAL | Status: DC
  Administered 2024-01-15 – 2024-01-22 (×8): 137 ug via ORAL
  Filled 2024-01-14 (×9): qty 1

## 2024-01-14 MED ORDER — DILTIAZEM LOAD VIA INFUSION
20.0000 mg | Freq: Once | INTRAVENOUS | Status: AC
  Administered 2024-01-14: 20 mg via INTRAVENOUS
  Filled 2024-01-14: qty 20

## 2024-01-14 MED ORDER — SODIUM CHLORIDE 0.9 % IV SOLN
2.0000 g | INTRAVENOUS | Status: DC
  Administered 2024-01-15: 2 g via INTRAVENOUS
  Filled 2024-01-14: qty 20

## 2024-01-14 MED ORDER — DILTIAZEM HCL-DEXTROSE 125-5 MG/125ML-% IV SOLN (PREMIX)
5.0000 mg/h | INTRAVENOUS | Status: DC
  Administered 2024-01-14: 10 mg/h via INTRAVENOUS
  Administered 2024-01-14 – 2024-01-15 (×2): 5 mg/h via INTRAVENOUS
  Administered 2024-01-15 (×2): 7.5 mg/h via INTRAVENOUS
  Filled 2024-01-14 (×3): qty 125

## 2024-01-14 MED ORDER — HYDROXYZINE HCL 10 MG PO TABS: 10.0000 mg | ORAL_TABLET | Freq: Three times a day (TID) | ORAL | Status: DC | PRN

## 2024-01-14 MED ORDER — ACETAMINOPHEN 500 MG PO TABS
1000.0000 mg | ORAL_TABLET | Freq: Once | ORAL | Status: AC
  Administered 2024-01-14: 1000 mg via ORAL
  Filled 2024-01-14: qty 2

## 2024-01-14 MED ORDER — APIXABAN 2.5 MG PO TABS
2.5000 mg | ORAL_TABLET | Freq: Two times a day (BID) | ORAL | Status: DC
  Administered 2024-01-14 – 2024-01-22 (×16): 2.5 mg via ORAL
  Filled 2024-01-14 (×16): qty 1

## 2024-01-14 MED ORDER — ORAL CARE MOUTH RINSE
15.0000 mL | OROMUCOSAL | Status: DC | PRN
Start: 2024-01-14 — End: 2024-01-22

## 2024-01-14 MED ORDER — EMPAGLIFLOZIN 10 MG PO TABS
10.0000 mg | ORAL_TABLET | Freq: Every day | ORAL | Status: DC
  Administered 2024-01-15 – 2024-01-22 (×7): 10 mg via ORAL
  Filled 2024-01-14 (×8): qty 1

## 2024-01-14 MED ORDER — ATORVASTATIN CALCIUM 20 MG PO TABS
20.0000 mg | ORAL_TABLET | Freq: Every day | ORAL | Status: DC
  Administered 2024-01-15 – 2024-01-22 (×8): 20 mg via ORAL
  Filled 2024-01-14 (×9): qty 1

## 2024-01-14 MED ORDER — ADULT MULTIVITAMIN W/MINERALS CH
1.0000 | ORAL_TABLET | Freq: Every day | ORAL | Status: DC
  Administered 2024-01-15 – 2024-01-22 (×8): 1 via ORAL
  Filled 2024-01-14 (×8): qty 1

## 2024-01-14 MED ORDER — IPRATROPIUM-ALBUTEROL 0.5-2.5 (3) MG/3ML IN SOLN
3.0000 mL | RESPIRATORY_TRACT | Status: DC
  Administered 2024-01-14 – 2024-01-15 (×2): 3 mL via RESPIRATORY_TRACT
  Filled 2024-01-14 (×2): qty 3

## 2024-01-14 MED ORDER — METOPROLOL TARTRATE 25 MG PO TABS: 25.0000 mg | ORAL_TABLET | Freq: Every day | ORAL | Status: DC

## 2024-01-14 MED ORDER — B COMPLEX-C PO TABS
1.0000 | ORAL_TABLET | Freq: Every day | ORAL | Status: DC
  Administered 2024-01-15 – 2024-01-22 (×8): 1 via ORAL
  Filled 2024-01-14 (×8): qty 1

## 2024-01-14 MED ORDER — DEXAMETHASONE SODIUM PHOSPHATE 10 MG/ML IJ SOLN
10.0000 mg | Freq: Once | INTRAMUSCULAR | Status: AC
  Administered 2024-01-14: 10 mg via INTRAVENOUS
  Filled 2024-01-14: qty 1

## 2024-01-14 MED ORDER — SODIUM CHLORIDE 0.9 % IV BOLUS (SEPSIS)
1000.0000 mL | Freq: Once | INTRAVENOUS | Status: AC
  Administered 2024-01-14: 1000 mL via INTRAVENOUS

## 2024-01-14 MED ORDER — SODIUM CHLORIDE 0.9 % IV SOLN
2.0000 g | Freq: Once | INTRAVENOUS | Status: AC
  Administered 2024-01-14: 2 g via INTRAVENOUS
  Filled 2024-01-14: qty 20

## 2024-01-14 MED ORDER — SERTRALINE HCL 25 MG PO TABS
25.0000 mg | ORAL_TABLET | Freq: Every day | ORAL | Status: DC
  Administered 2024-01-15 – 2024-01-22 (×8): 25 mg via ORAL
  Filled 2024-01-14 (×8): qty 1

## 2024-01-14 MED ORDER — VITAMIN C 500 MG PO TABS
500.0000 mg | ORAL_TABLET | Freq: Every day | ORAL | Status: DC
  Administered 2024-01-15 – 2024-01-22 (×8): 500 mg via ORAL
  Filled 2024-01-14 (×8): qty 1

## 2024-01-14 MED ORDER — ALBUTEROL SULFATE (2.5 MG/3ML) 0.083% IN NEBU
3.0000 mL | INHALATION_SOLUTION | RESPIRATORY_TRACT | Status: DC | PRN
  Administered 2024-01-17: 3 mL via RESPIRATORY_TRACT

## 2024-01-14 MED ORDER — METOPROLOL TARTRATE 5 MG/5ML IV SOLN: 5.0000 mg | Freq: Once | INTRAVENOUS | Status: DC

## 2024-01-14 MED ORDER — SODIUM CHLORIDE 0.9 % IV BOLUS
500.0000 mL | Freq: Once | INTRAVENOUS | Status: AC
  Administered 2024-01-14: 500 mL via INTRAVENOUS

## 2024-01-14 MED ORDER — DILTIAZEM LOAD VIA INFUSION
20.0000 mg | Freq: Once | INTRAVENOUS | Status: DC
  Filled 2024-01-14: qty 20

## 2024-01-14 MED ORDER — FUROSEMIDE 10 MG/ML IJ SOLN
40.0000 mg | Freq: Once | INTRAMUSCULAR | Status: AC
  Administered 2024-01-14: 40 mg via INTRAVENOUS
  Filled 2024-01-14: qty 4

## 2024-01-14 MED ORDER — TRAZODONE HCL 100 MG PO TABS
100.0000 mg | ORAL_TABLET | Freq: Every day | ORAL | Status: DC
  Administered 2024-01-14 – 2024-01-21 (×8): 100 mg via ORAL
  Filled 2024-01-14 (×8): qty 1

## 2024-01-14 MED ORDER — DM-GUAIFENESIN ER 30-600 MG PO TB12: 1.0000 | ORAL_TABLET | Freq: Two times a day (BID) | ORAL | Status: DC | PRN

## 2024-01-14 MED ORDER — SODIUM CHLORIDE 0.9 % IV SOLN
500.0000 mg | Freq: Once | INTRAVENOUS | Status: AC
  Administered 2024-01-14: 500 mg via INTRAVENOUS
  Filled 2024-01-14: qty 5

## 2024-01-14 NOTE — ED Triage Notes (Signed)
 Pt comes in via [facility] from [facility] with complaints of SOB x5 days. Pt had a cxr at the facility that with no findings. Pt sent out to ER for evaluation, and was discovered to be in AFIB RVR. Pt with no complaints of pain, but does complain of discomfort with her non productive cough. Pt is alert and oriented x4.

## 2024-01-14 NOTE — H&P (Signed)
 History and Physical    Diane Chambers FMW:[id number] DOB: 1933/08/18 DOA: 01/14/2024  Referring MD/NP/PA:   PCP: Pcp, No   Patient coming from:  The patient is coming from SNF   Chief Complaint: SOB, fever  HPI: Diane Chambers is a 89 y.o. female with medical history significant of A fib on Eliquis , dCHF, HTN, HLD, multiple stroke with left arm weakness, vascular dementia, hypothyroidism, colitis, carotid artery stenosis, who presents with SOB.  Per patient and her son at the bedside, patient has generalized weakness and decreased appetite and oral intake for more than 3 days.  Since this morning, patient developed SOB, which has been progressively worsening.  Patient has dry cough, no chest pain. Patient has fever and chills.  Her temperature is 100.7 in ED.  Per report, patient has oxygen desaturation to 87% in facility.  He is oxygen saturation is 94% in the ED.  Her son states that patient's heart rate was up to 175 earlier.  Her heart rate is 140-160 in ED.  Patient had diarrhea recently which has resolved.  Currently no nausea, vomiting, diarrhea or abdominal pain.  No symptoms of UTI.  Her son states that patient had cyanotic feet and hands earlier, described as purple color change, which has resolved when I saw patient in ED.  Patient has warm hands and warm feet with normal color when I examined her.  Patient states that patient has history of vascular dementia with occasional intermittent hallucination, but no hallucination today.  Her mental status is at baseline.  Patient is orientated x 3 now.  Pt was found to have A-fib with RVR, with heart rate 140-160s in ED, patient was given two dose of IV Cardizem  (10 mg and 20 mg), then started Cardizem  drip.  Heart rate improved to 90s.  Data reviewed independently and ED Course: pt was found to have troponin 35 --> 34, WBC 9.4, GFR> 60, negative PCR for COVID, flu and RSV, lactic acid 2.6 --> 1.6, BNP 293, TSH normal 1.842, T4 normal 0.94,  temperature 100.7, blood pressure 147/81, RR 20, oxygen saturation 94% on room air.  Patient is admitted to PCU as inpatient.  Chest x-ray: Borderline heart size. Peribronchial thickening with hazy perihilar infiltrates may indicate edema or bronchitis. No focal consolidation.    EKG: I have personally reviewed.  A-fib, heart rate 146, QTc 465,   Review of Systems:   General: has fevers, chills, no body weight gain, has poor appetite, has fatigue HEENT: no blurry vision, hearing changes or sore throat Respiratory: has dyspnea, coughing, wheezing CV: no chest pain, no palpitations GI: no nausea, vomiting, abdominal pain, diarrhea, constipation GU: no dysuria, burning on urination, increased urinary frequency, hematuria  Ext: has trace leg edema Neuro: no unilateral weakness, numbness, or tingling, no vision change or hearing loss Skin: no rash, no skin tear. MSK: No muscle spasm, no deformity, no limitation of range of movement in spin Heme: No easy bruising.  Travel history: No recent long distant travel.   Allergy:  Allergies  Allergen Reactions   Calcium -Containing Compounds Other (See Comments)    Restless legs   Lactose Intolerance (Gi) Diarrhea    Past Medical History:  Diagnosis Date   Age-related macular degeneration, wet, right eye (HCC)    Carotid artery stenosis, asymptomatic, bilateral    last duplex in epic 02-18-2015  bilateral ICA <50%   Chronically dry eyes    Fracture 01/24/2018   right foot-neck of fifth metatarsal  GERD (gastroesophageal reflux disease)    Hiatal hernia    History of colitis 12/16/2016   secondary to champylobacter   History of recurrent TIAs    total x4  last one 12-29-2014---  (01-23-2018 per pt no residual's)   History of syncope    11/ 2018 approx. , cardiac work-up done by dr ladona    Hypertension    Hypothyroidism    followed by pcp   Lower urinary tract symptoms (LUTS)    OA (osteoarthritis)    knees   RLS (restless  legs syndrome)    Venous stasis    01-23-2018 per pt elevates feet when sitting , if not toes become numb   Wears glasses    Wears hearing aid in right ear     Past Surgical History:  Procedure Laterality Date   APPENDECTOMY  AGE 54   CARDIOVASCULAR STRESS TEST  04-28-2017  dr ladona   Low risk nuclear study w/ small sized , mild intensity, reversible apical, inferoapical perfusion defect , no ischemia/  normal LV function and wall motion , EF 75%   CATARACT EXTRACTION W/ INTRAOCULAR LENS  IMPLANT, BILATERAL  2004;  2006   DILATION AND CURETTAGE OF UTERUS  YRS AGO   X3   EYE SURGERY Right 2011 APPROX.   CORNEA (FOR EROSION)   FOOT SURGERY Right 2004   [facility] NE[facility]OMA   FRACTURE SURGERY  TEEN   RIGHT ARM , PNNING   LAPAROSCOPIC CHOLECYSTECTOMY  2011  APPROX.   TONSILLECTOMY  CHILD   TOTAL KNEE ARTHROPLASTY Right 2010   TOTAL KNEE ARTHROPLASTY Left 01/29/2018   Procedure: LEFT TOTAL KNEE ARTHROPLASTY;  Surgeon: Melodi Lerner, MD;  Location: [facility];  Service: Orthopedics;  Laterality: Left;   TRANSTHORACIC ECHOCARDIOGRAM  05-03-2017   dr ladona   moderate concentric LVH, ef 66%/  mild to moderate MR with moderate MV leaflet calcification with no stenosis/  mild to moderate TR/  PASP   TRIGGER FINGER RELEASE  2005   RIGHT THUMB    Social History:  reports that she quit smoking about 7 years ago. Her smoking use included cigarettes. She started smoking about 33 years ago. She has never used smokeless tobacco. She reports current alcohol use of about 12.0 - 14.0 standard drinks of alcohol per week. She reports that she does not use drugs.  Family History:  Family History  Problem Relation Age of Onset   Diabetes Mother    Hyperlipidemia Father      Prior to Admission medications   Medication Sig Start Date End Date Taking? Authorizing Provider  amLODipine  (NORVASC ) 5 MG tablet Take 5 mg by mouth every morning.  11/30/16   [provider]  atorvastatin  (LIPITOR) 20  MG tablet Take 20 mg by mouth at bedtime.     [provider]  docusate sodium  (COLACE) 100 MG capsule Take 1 capsule (100 mg total) by mouth 2 (two) times daily. 01/30/18   Edmisten, Roxie CROME, PA  HYDROmorphone  (DILAUDID ) 2 MG tablet Take 1-2 tablets (2-4 mg total) by mouth every 6 (six) hours as needed for moderate pain or severe pain (refractory to oxycodone ). 01/31/18   Edmisten, Kristie L, PA  levothyroxine  (SYNTHROID , LEVOTHROID) 112 MCG tablet Take 112 mcg by mouth daily before breakfast.     [provider]  losartan  (COZAAR ) 100 MG tablet Take 100 mg by mouth every morning.  09/14/16   [provider]  methocarbamol  (ROBAXIN ) 500 MG tablet Take 1 tablet (500  mg total) by mouth every 6 (six) hours as needed for muscle spasms. 01/31/18   Edmisten, Kristie L, PA  omeprazole (PRILOSEC) 20 MG capsule Take 20 mg by mouth 2 (two) times daily before a meal.  11/24/16   [provider]  ondansetron  (ZOFRAN ) 4 MG tablet Take 1 tablet (4 mg total) by mouth every 6 (six) hours as needed for nausea. 01/30/18   Edmisten, Kristie L, PA  polyethylene glycol (MIRALAX  / GLYCOLAX ) packet Take 17 g by mouth daily as needed for mild constipation. 01/30/18   Edmisten, Roxie CROME, PA  Polyvinyl Alcohol-Povidone PF (REFRESH) 1.4-0.6 % SOLN Place 1 drop into both eyes 4 (four) times daily.    [provider]  sertraline  (ZOLOFT ) 50 MG tablet Take 50 mg by mouth at bedtime.  10/28/16   [provider]  traZODone  (DESYREL ) 50 MG tablet Take 0.5 tablets (25 mg total) by mouth at bedtime as needed for sleep. Patient taking differently: Take 50 mg by mouth at bedtime.  12/30/14   Vann, Jessica U, DO  zolpidem  (AMBIEN ) 10 MG tablet Take 5 mg by mouth at bedtime as needed for sleep.     [provider]    Physical Exam: Vitals:   01/14/24 2104 01/14/24 2132 01/14/24 2133 01/14/24 2200  BP: 93/74  (!) 91/55 (!) 92/49  Pulse:   93 91  Resp: (!) 21  17 19   Temp:   98.7  F (37.1 C)   TempSrc:   Oral   SpO2:   96% 95%  Weight:  56.6 kg    Height:  5' 6.5 (1.689 m)     General: Not in acute distress HEENT:       Eyes: PERRL, EOMI, no jaundice       ENT: No discharge from the ears and nose, no pharynx injection, no tonsillar enlargement.        Neck: No JVD, no bruit, no mass felt. Heme: No neck lymph node enlargement. Cardiac: S1/S2, fast heart rate with irregularly irregular rhythm, no gallops or rubs. Respiratory: Has crackles, rhonchi and wheezing bilaterally, the right side  is worse than in the left. GI: Soft, nondistended, nontender, no rebound pain, no organomegaly, BS present. GU: No hematuria Ext: has trace leg edema bilaterally. 1+DP/PT pulse bilaterally. Musculoskeletal: No joint deformities, No joint redness or warmth, no limitation of ROM in spin. Skin: No rashes.  Neuro: Alert, oriented X3, cranial nerves II-XII grossly intact.  Has left arm weakness from a previous stroke. Psych: Patient is not psychotic, no suicidal or hemocidal ideation.  Labs on Admission: I have personally reviewed following labs and imaging studies  CBC: Recent Labs  Lab 01/14/24 1536  WBC 9.4  NEUTROABS 7.3  HGB 11.7*  HCT 36.0  MCV 97.3  PLT 405*   Basic Metabolic Panel: Recent Labs  Lab 01/14/24 1536  NA 135  K 3.6  CL 98  CO2 27  GLUCOSE 143*  BUN 14  CREATININE 0.77  CALCIUM  8.5*   GFR: Estimated Creatinine Clearance: 43.4 mL/min (by C-G formula based on SCr of 0.77 mg/dL). Liver Function Tests: Recent Labs  Lab 01/14/24 1536  AST 32  ALT 28  ALKPHOS 137*  BILITOT 0.6  PROT 6.8  ALBUMIN 2.9*   Recent Labs  Lab 01/14/24 1536  LIPASE 28   No results for input(s): AMMONIA in the last 168 hours. Coagulation Profile: Recent Labs  Lab 01/14/24 1536  INR 1.1   Cardiac Enzymes: No results for input(s):  CKTOTAL, CKMB, CKMBINDEX, TROPONINI in the last 168 hours. BNP (last 3 results) No results for input(s): PROBNP  in the last 8760 hours. HbA1C: No results for input(s): HGBA1C in the last 72 hours. CBG: No results for input(s): GLUCAP in the last 168 hours. Lipid Profile: No results for input(s): CHOL, HDL, LDLCALC, TRIG, CHOLHDL, LDLDIRECT in the last 72 hours. Thyroid  Function Tests: Recent Labs    01/14/24 1536  TSH 1.842  FREET4 0.94   Anemia Panel: No results for input(s): VITAMINB12, FOLATE, FERRITIN, TIBC, IRON, RETICCTPCT in the last 72 hours. Urine analysis:    Component Value Date/Time   COLORURINE AMBER (A) 12/16/2016 1406   APPEARANCEUR HAZY (A) 12/16/2016 1406   LABSPEC 1.016 12/16/2016 1406   PHURINE 7.0 12/16/2016 1406   GLUCOSEU NEGATIVE 12/16/2016 1406   HGBUR NEGATIVE 12/16/2016 1406   BILIRUBINUR NEGATIVE 12/16/2016 1406   KETONESUR 5 (A) 12/16/2016 1406   PROTEINUR 100 (A) 12/16/2016 1406   UROBILINOGEN 0.2 12/29/2014 1241   NITRITE NEGATIVE 12/16/2016 1406   LEUKOCYTESUR NEGATIVE 12/16/2016 1406   Sepsis Labs: @LABRCNTIP (procalcitonin:4,lacticidven:4) ) Recent Results (from the past 240 hours)  Resp panel by RT-PCR (RSV, Flu A&B, Covid) Anterior Nasal Swab     Status: None   Collection Time: 01/14/24  3:52 PM   Specimen: Anterior Nasal Swab  Result Value Ref Range Status   SARS Coronavirus 2 by RT PCR NEGATIVE NEGATIVE Final    Comment: (NOTE) SARS-CoV-2 target nucleic acids are NOT DETECTED.  The SARS-CoV-2 RNA is generally detectable in upper respiratory specimens during the acute phase of infection. The lowest concentration of SARS-CoV-2 viral copies this assay can detect is 138 copies/mL. A negative result does not preclude SARS-Cov-2 infection and should not be used as the sole basis for treatment or other patient management decisions. A negative result may occur with  improper specimen collection/handling, submission of specimen other than nasopharyngeal swab, presence of viral mutation(s) within the areas targeted by  this assay, and inadequate number of viral copies(<138 copies/mL). A negative result must be combined with clinical observations, patient history, and epidemiological information. The expected result is Negative.  Fact Sheet for Patients:  BloggerCourse.com  Fact Sheet for Healthcare Providers:  SeriousBroker.it  This test is no t yet approved or cleared by the [place]  FDA and  has been authorized for detection and/or diagnosis of SARS-CoV-2 by FDA under an Emergency Use Authorization (EUA). This EUA will remain  in effect (meaning this test can be used) for the duration of the COVID-19 declaration under Section 564(b)(1) of the Act, 21 U.S.C.section 360bbb-3(b)(1), unless the authorization is terminated  or revoked sooner.       Influenza A by PCR NEGATIVE NEGATIVE Final   Influenza B by PCR NEGATIVE NEGATIVE Final    Comment: (NOTE) The Xpert Xpress SARS-CoV-2/FLU/RSV plus assay is intended as an aid in the diagnosis of influenza from Nasopharyngeal swab specimens and should not be used as a sole basis for treatment. Nasal washings and aspirates are unacceptable for Xpert Xpress SARS-CoV-2/FLU/RSV testing.  Fact Sheet for Patients: BloggerCourse.com  Fact Sheet for Healthcare Providers: SeriousBroker.it  This test is not yet approved or cleared by the [place]  FDA and has been authorized for detection and/or diagnosis of SARS-CoV-2 by FDA under an Emergency Use Authorization (EUA). This EUA will remain in effect (meaning this test can be used) for the duration of the COVID-19 declaration under Section 564(b)(1) of the Act, 21 U.S.C. section 360bbb-3(b)(1), unless the authorization is  terminated or revoked.     Resp Syncytial Virus by PCR NEGATIVE NEGATIVE Final    Comment: (NOTE) Fact Sheet for Patients: BloggerCourse.com  Fact Sheet  for Healthcare Providers: SeriousBroker.it  This test is not yet approved or cleared by the [place]  FDA and has been authorized for detection and/or diagnosis of SARS-CoV-2 by FDA under an Emergency Use Authorization (EUA). This EUA will remain in effect (meaning this test can be used) for the duration of the COVID-19 declaration under Section 564(b)(1) of the Act, 21 U.S.C. section 360bbb-3(b)(1), unless the authorization is terminated or revoked.  Performed at [facility], [address], [place], [place] [address]      Radiological Exams on Admission:   Assessment/Plan Principal Problem:   Acute bronchitis Active Problems:   Sepsis (HCC)   Chronic diastolic CHF (congestive heart failure) (HCC)   Myocardial injury   Chronic atrial fibrillation with RVR (HCC)   Hypothyroidism   TIA (transient ischemic attack)   Essential hypertension   HLD (hyperlipidemia)   Assessment and Plan:  Sepsis due to acute bronchitis: Pt meets criteria for sepsis with fever 100.7 and tachycardia with A-fib with RVR.  Lactic acid elevated 2.6-->  1.6.  -will admit patient to PCU as inpt due to A fib with RVR -Bronchodilators and prn Mucinex  - Prednisone  50 mg daily after given 10 mg Decadron  in ED - IV Rocephin  and azithromycin  -Incentive spirometry -Follow up sputum culture, blood culture - pt has received 1.5 L normal saline  Chronic diastolic CHF (congestive heart failure) (HCC): 2D echo on 12/30/2014 showed EF of 55 to 60% with grade 1 diastolic dysfunction.  Patient has had elevated BNP of 293.  Received 1.5 L normal saline in ED.  Now lactic acid level is normalized. - will give 40 mg of Lasix  IV - Continue oral Lasix  to 40 mg daily  Myocardial injury: Troponin 35 --> 34, likely demand ischemia. -Will not give aspirin  since patient is on Eliquis  - Lipitor  Chronic atrial fibrillation with RVR (HCC): -Continue Eliquis  - Continue home  metoprolol  25 mg daily - Continue Cardizem  drip  Hypothyroidism: TSH and T4 normal -Continue Synthroid  175 mcg daily  TIA (transient ischemic attack) -On Eliquis  and Lipitor  Essential hypertension - IV hydralazine as needed - Metoprolol   HLD (hyperlipidemia) - Lipitor      DVT ppx: on Eliquis   Code Status: DNR per pt and his son  Family Communication:  Yes, patient's son at bed side.   Disposition Plan:  Anticipate discharge back to previous environment,  SNF  Consults called:  none   Admission status and Level of care: Progressive: as inpt        Dispo: The patient is from: SNF              Anticipated d/c is to: SNF              Anticipated d/c date is: 2 days              Patient currently is not medically stable to d/c.    Severity of Illness:  The appropriate patient status for this patient is INPATIENT. Inpatient status is judged to be reasonable and necessary in order to provide the required intensity of service to ensure the patient's safety. The patient's presenting symptoms, physical exam findings, and initial radiographic and laboratory data in the context of their chronic comorbidities is felt to place them at high risk for further clinical deterioration. Furthermore, it is  not anticipated that the patient will be medically stable for discharge from the hospital within 2 midnights of admission.   * I certify that at the point of admission it is my clinical judgment that the patient will require inpatient hospital care spanning beyond 2 midnights from the point of admission due to high intensity of service, high risk for further deterioration and high frequency of surveillance required.*       Date of Service 01/14/2024    Caleb Exon [facility]   If 7PM-7AM, please contact night-coverage [email or web address] 01/14/2024, 10:54 PM

## 2024-01-14 NOTE — Sepsis Progress Note (Signed)
 Sepsis protocol is being followed by [facility].

## 2024-01-14 NOTE — Consult Note (Signed)
 CODE SEPSIS - PHARMACY COMMUNICATION  **Broad Spectrum Antibiotics should be administered within 1 hour of Sepsis diagnosis**  Time Code Sepsis Called/Page Received: 1535  Antibiotics Ordered: ceftriaxone  and azithromycin   Time of 1st antibiotic administration: 1553  Additional action taken by pharmacy: N/A  Kayla JULIANNA Blew ,PharmD Clinical Pharmacist  01/14/2024  3:46 PM

## 2024-01-14 NOTE — ED Provider Notes (Signed)
 [facility] Provider Note    Event Date/Time   First MD Initiated Contact with Patient 01/14/24 1526     (approximate)   History   Chief Complaint: Shortness of Breath   HPI  Diane Chambers is a 89 y.o. female who comes in with shortness of breath for the past 5 days associated with nonproductive cough.  Also has chills.  Was noted by EMS to be in atrial fibrillation with a heart rate of 160.  Patient reports she is not able to feel her rapid heart rate.  She does feel fatigued denies dizziness chest pain.        Past Medical History:  Diagnosis Date   Age-related macular degeneration, wet, right eye (HCC)    Carotid artery stenosis, asymptomatic, bilateral    last duplex in epic 02-18-2015  bilateral ICA <50%   Chronically dry eyes    Fracture 01/24/2018   right foot-neck of fifth metatarsal    GERD (gastroesophageal reflux disease)    Hiatal hernia    History of colitis 12/16/2016   secondary to champylobacter   History of recurrent TIAs    total x4  last one 12-29-2014---  (01-23-2018 per pt no residual's)   History of syncope    11/ 2018 approx. , cardiac work-up done by dr ladona    Hypertension    Hypothyroidism    followed by pcp   Lower urinary tract symptoms (LUTS)    OA (osteoarthritis)    knees   RLS (restless legs syndrome)    Venous stasis    01-23-2018 per pt elevates feet when sitting , if not toes become numb   Wears glasses    Wears hearing aid in right ear     Current Outpatient Rx   Order #: [id number] Class: Historical Med   Order #: [id number] Class: Historical Med   Order #: [id number] Class: Normal   Order #: [id number] Class: Print   Order #: [id number] Class: Historical Med   Order #: [id number] Class: Historical Med   Order #: [id number] Class: Print   Order #: [id number] Class: Historical Med   Order #: [id number] Class: Normal   Order #: [id number] Class: Normal   Order #: [id number] Class: Historical Med   Order #:  [id number] Class: Historical Med   Order #: [id number] Class: No Print   Order #: [id number] Class: Historical Med    Past Surgical History:  Procedure Laterality Date   APPENDECTOMY  AGE 89   CARDIOVASCULAR STRESS TEST  04-28-2017  dr ladona   Low risk nuclear study w/ small sized , mild intensity, reversible apical, inferoapical perfusion defect , no ischemia/  normal LV function and wall motion , EF 75%   CATARACT EXTRACTION W/ INTRAOCULAR LENS  IMPLANT, BILATERAL  2004;  2006   DILATION AND CURETTAGE OF UTERUS  YRS AGO   X3   EYE SURGERY Right 2011 APPROX.   [facility] (FOR EROSION)   FOOT SURGERY Right 2004   [facility] NE[facility]OMA   FRACTURE SURGERY  TEEN   RIGHT ARM , PNNING   LAPAROSCOPIC CHOLECYSTECTOMY  2011  APPROX.   TONSILLECTOMY  CHILD   TOTAL KNEE ARTHROPLASTY Right 2010   TOTAL KNEE ARTHROPLASTY Left 01/29/2018   Procedure: LEFT TOTAL KNEE ARTHROPLASTY;  Surgeon: Melodi Lerner, MD;  Location: [facility];  Service: Orthopedics;  Laterality: Left;   TRANSTHORACIC ECHOCARDIOGRAM  05-03-2017   dr ladona   moderate concentric LVH, ef 66%/  mild to moderate MR with moderate MV leaflet calcification with no stenosis/  mild to moderate  TR/  PASP   TRIGGER FINGER RELEASE  2005   RIGHT THUMB    Physical Exam   Triage Vital Signs: ED Triage Vitals  Encounter Vitals Group     BP 01/14/24 1530 (!) 147/81     Girls Systolic BP Percentile --      Girls Diastolic BP Percentile --      Boys Systolic BP Percentile --      Boys Diastolic BP Percentile --      Pulse Rate 01/14/24 1530 (!) 146     Resp 01/14/24 1530 20     Temp 01/14/24 1530 (!) 100.7 F (38.2 C)     Temp src --      SpO2 01/14/24 1530 94 %     Weight 01/14/24 1532 125 lb 14.1 oz (57.1 kg)     Height --      Head Circumference --      Peak Flow --      Pain Score 01/14/24 1532 0     Pain Loc --      Pain Education --      Exclude from Growth Chart --     Most recent vital signs: Vitals:   01/14/24 1530  BP: (!)  147/81  Pulse: (!) 146  Resp: 20  Temp: (!) 100.7 F (38.2 C)  SpO2: 94%    General: Awake, no distress.  CV:  Good peripheral perfusion.  Irregular rhythm, heart rate 150. Resp:  Tachypnea.  Diffuse expiratory wheezing, coarse inspiratory breath sounds diffusely. Abd:  No distention.  Soft nontender Other:  Trace lower extremity edema bilaterally   ED Results / Procedures / Treatments   Labs (all labs ordered are listed, but only abnormal results are displayed) Labs Reviewed  LACTIC ACID, PLASMA - Abnormal; Notable for the following components:      Result Value   Lactic Acid, Venous 2.6 (*)    All other components within normal limits  COMPREHENSIVE METABOLIC PANEL WITH GFR - Abnormal; Notable for the following components:   Glucose, Bld 143 (*)    Calcium  8.5 (*)    Albumin 2.9 (*)    Alkaline Phosphatase 137 (*)    All other components within normal limits  CBC WITH DIFFERENTIAL/PLATELET - Abnormal; Notable for the following components:   RBC 3.70 (*)    Hemoglobin 11.7 (*)    RDW 16.0 (*)    Platelets 405 (*)    All other components within normal limits  TROPONIN I (HIGH SENSITIVITY) - Abnormal; Notable for the following components:   Troponin I (High Sensitivity) 35 (*)    All other components within normal limits  TROPONIN I (HIGH SENSITIVITY) - Abnormal; Notable for the following components:   Troponin I (High Sensitivity) 34 (*)    All other components within normal limits  RESP PANEL BY RT-PCR (RSV, FLU A&B, COVID)  RVPGX2  CULTURE, BLOOD (ROUTINE X 2)  CULTURE, BLOOD (ROUTINE X 2)  LACTIC ACID, PLASMA  PROTIME-INR  LIPASE, BLOOD  T4, FREE  TSH  URINALYSIS, W/ REFLEX TO CULTURE (INFECTION SUSPECTED)     EKG Interpreted by me Atrial fib rate 146. Right axis. Normal intervals. No ischemic changes   RADIOLOGY Chest xray interpreted by me, shows hazy bilateral infiltrates.  Radiology report reviewed   PROCEDURES:  .Critical Care  Performed by:  Viviann Pastor, MD Authorized by: Viviann Pastor, MD   Critical care provider statement:    Critical care time (minutes):  35   Critical  care time was exclusive of:  Separately billable procedures and treating other patients   Critical care was necessary to treat or prevent imminent or life-threatening deterioration of the following conditions:  Sepsis and cardiac failure   Critical care was time spent personally by me on the following activities:  Development of treatment plan with patient or surrogate, discussions with consultants, evaluation of patient's response to treatment, examination of patient, obtaining history from patient or surrogate, ordering and performing treatments and interventions, ordering and review of laboratory studies, ordering and review of radiographic studies, pulse oximetry, re-evaluation of patient's condition and review of old charts   Care discussed with: admitting provider      MEDICATIONS ORDERED IN ED: Medications  diltiazem  (CARDIZEM ) 125 mg in dextrose  5% 125 mL (1 mg/mL) infusion (has no administration in time range)  sodium chloride  0.9 % bolus 500 mL (has no administration in time range)  acetaminophen  (TYLENOL ) tablet 1,000 mg (has no administration in time range)  sodium chloride  0.9 % bolus 1,000 mL (0 mLs Intravenous Stopped 01/14/24 1659)  cefTRIAXone  (ROCEPHIN ) 2 g in sodium chloride  0.9 % 100 mL IVPB (0 g Intravenous Stopped 01/14/24 1632)  azithromycin  (ZITHROMAX ) 500 mg in sodium chloride  0.9 % 250 mL IVPB (0 mg Intravenous Stopped 01/14/24 1738)  dexamethasone  (DECADRON ) injection 10 mg (10 mg Intravenous Given 01/14/24 1559)  diltiazem  (CARDIZEM ) injection 15 mg (15 mg Intravenous Given 01/14/24 1704)     IMPRESSION / MDM / ASSESSMENT AND PLAN / ED COURSE  I reviewed the triage vital signs and the nursing notes.  DDx: Electrolyte derangement, AKI, pneumonia, sepsis, UTI, COVID, influenza, non-STEMI, hypothyroidism, new onset atrial  fibrillation  Patient's presentation is most consistent with acute presentation with potential threat to life or bodily function.  Patient presents with shortness of breath, fatigue, tachycardia, appears to be in atrial fibrillation with RVR.  Also febrile with respiratory symptoms.  Patient given Decadron , Rocephin  and azithromycin , IV fluids.  After initial resuscitation, patient remains tachycardic after IV diltiazem .  Heart rate persistently around 145.  Will start diltiazem  infusion and plan to admit   ----------------------------------------- 6:50 PM on 01/14/2024 ----------------------------------------- Discussed with hospitalist.  Will give additional AV nodal blocker, attempt to slow the heart rate and obtain EKG to better elucidate the rhythm.      FINAL CLINICAL IMPRESSION(S) / ED DIAGNOSES   Final diagnoses:  Atrial fibrillation with RVR (HCC)  Community acquired pneumonia, unspecified laterality  Sepsis, due to unspecified organism, unspecified whether acute organ dysfunction present [facility])     Rx / DC Orders   ED Discharge Orders     None        Note:  This document was prepared using Dragon voice recognition software and may include unintentional dictation errors.   Viviann Pastor, MD 01/14/24 [phone]

## 2024-01-14 NOTE — ED Notes (Signed)
 Md [place] at bedside to put in orders

## 2024-01-15 ENCOUNTER — Inpatient Hospital Stay
Admit: 2024-01-15 | Discharge: 2024-01-15 | Disposition: A | Discharge status: Home / Self Care | Attending: Infectious Diseases | Admitting: Infectious Diseases

## 2024-01-15 DIAGNOSIS — A419 Sepsis, unspecified organism: Secondary | ICD-10-CM | POA: Diagnosis not present

## 2024-01-15 DIAGNOSIS — Z87891 Personal history of nicotine dependence: Secondary | ICD-10-CM

## 2024-01-15 DIAGNOSIS — B9781 Human metapneumovirus as the cause of diseases classified elsewhere: Secondary | ICD-10-CM

## 2024-01-15 DIAGNOSIS — I5032 Chronic diastolic (congestive) heart failure: Secondary | ICD-10-CM | POA: Diagnosis not present

## 2024-01-15 DIAGNOSIS — J40 Bronchitis, not specified as acute or chronic: Secondary | ICD-10-CM

## 2024-01-15 DIAGNOSIS — J209 Acute bronchitis, unspecified: Secondary | ICD-10-CM | POA: Diagnosis not present

## 2024-01-15 LAB — MRSA NEXT GEN BY PCR, NASAL: MRSA by PCR Next Gen: DETECTED — AB

## 2024-01-15 LAB — BLOOD CULTURE ID PANEL (REFLEXED) - BCID2

## 2024-01-15 LAB — BASIC METABOLIC PANEL WITH GFR
Anion gap: 14 (ref 5–15)
BUN: 18 mg/dL (ref 8–23)
CO2: 21 mmol/L — ABNORMAL LOW (ref 22–32)
Calcium: 8.7 mg/dL — ABNORMAL LOW (ref 8.9–10.3)
Chloride: 104 mmol/L (ref 98–111)
Creatinine, Ser: 0.64 mg/dL (ref 0.44–1.00)
GFR, Estimated: >60 mL/min (ref >60)
Glucose, Bld: 164 mg/dL — ABNORMAL HIGH (ref 70–99)
Potassium: 4 mmol/L (ref 3.5–5.1)
Sodium: 139 mmol/L (ref 135–145)

## 2024-01-15 LAB — RESPIRATORY PANEL BY PCR

## 2024-01-15 LAB — CBC
HCT: 34.3 % — ABNORMAL LOW (ref 36.0–46.0)
Hemoglobin: 10.9 g/dL — ABNORMAL LOW (ref 12.0–15.0)
MCH: 30.4 pg (ref 26.0–34.0)
MCHC: 31.8 g/dL (ref 30.0–36.0)
MCV: 95.5 fL (ref 80.0–100.0)
Platelets: 385 K/uL (ref 150–400)
RBC: 3.59 MIL/uL — ABNORMAL LOW (ref 3.87–5.11)
RDW: 15.9 % — ABNORMAL HIGH (ref 11.5–15.5)
WBC: 7.5 K/uL (ref 4.0–10.5)
nRBC: 0 % (ref 0.0–0.2)

## 2024-01-15 LAB — ECHOCARDIOGRAM COMPLETE
AR max vel: 2.63 cm2
AV Area VTI: 3.06 cm2
AV Area mean vel: 2.61 cm2
AV Mean grad: 4 mmHg
AV Peak grad: 8.1 mmHg
Ao pk vel: 1.42 m/s
Area-P 1/2: 8.52 cm2
Height: 66.5 in
S' Lateral: 2.25 cm
Weight: 1985.9 [oz_av]

## 2024-01-15 MED ORDER — VANCOMYCIN HCL 750 MG/150ML IV SOLN
750.0000 mg | INTRAVENOUS | Status: DC
  Administered 2024-01-16 – 2024-01-18 (×3): 750 mg via INTRAVENOUS
  Filled 2024-01-15 (×3): qty 150

## 2024-01-15 MED ORDER — PREDNISONE 50 MG PO TABS
50.0000 mg | ORAL_TABLET | Freq: Every day | ORAL | Status: DC
  Administered 2024-01-15 – 2024-01-16 (×2): 50 mg via ORAL
  Filled 2024-01-15 (×2): qty 1

## 2024-01-15 MED ORDER — METOPROLOL TARTRATE 50 MG PO TABS
50.0000 mg | ORAL_TABLET | Freq: Two times a day (BID) | ORAL | Status: DC
  Administered 2024-01-15 – 2024-01-22 (×14): 50 mg via ORAL
  Filled 2024-01-15 (×14): qty 1

## 2024-01-15 MED ORDER — SODIUM CHLORIDE 0.9 % IV SOLN
500.0000 mg | INTRAVENOUS | Status: DC
  Administered 2024-01-15: 500 mg via INTRAVENOUS
  Filled 2024-01-15: qty 5

## 2024-01-15 MED ORDER — IPRATROPIUM-ALBUTEROL 0.5-2.5 (3) MG/3ML IN SOLN
3.0000 mL | Freq: Four times a day (QID) | RESPIRATORY_TRACT | Status: DC
  Administered 2024-01-15 – 2024-01-16 (×2): 3 mL via RESPIRATORY_TRACT
  Filled 2024-01-15 (×3): qty 3

## 2024-01-15 MED ORDER — MUPIROCIN 2 % EX OINT
1.0000 | TOPICAL_OINTMENT | Freq: Two times a day (BID) | CUTANEOUS | Status: AC
  Administered 2024-01-15 – 2024-01-19 (×10): 1 via NASAL
  Filled 2024-01-15: qty 22

## 2024-01-15 MED ORDER — VANCOMYCIN HCL 1250 MG/250ML IV SOLN
1250.0000 mg | Freq: Once | INTRAVENOUS | Status: AC
  Administered 2024-01-15: 1250 mg via INTRAVENOUS
  Filled 2024-01-15: qty 250

## 2024-01-15 MED ORDER — CHLORHEXIDINE GLUCONATE CLOTH 2 % EX PADS
6.0000 | MEDICATED_PAD | Freq: Every day | CUTANEOUS | Status: DC
Start: 2024-01-15 — End: 2024-01-15

## 2024-01-15 NOTE — Plan of Care (Signed)

## 2024-01-15 NOTE — Consult Note (Signed)
 NAME: Diane Chambers  DOB: Oct 08, 1933  MRN: [id number]  Date/Time: 01/15/2024 1:22 PM  REQUESTING PROVIDER: Dr.Griffith Subjective:  REASON FOR CONSULT: MRSA bacteremia ? Diane Chambers is a 89 y.o. female with a history of Afib on eliquis , HTN, HLD, CVA, CHF, left TKA, laminectomy presents from SNF  with cough and shorthess of breath of 3 days duration. She also has decreased appetite and generalized weakness She has fever and chills  01/14/24 15:30  BP 147/81 !  Temp 100.7 F (38.2 C) !  Pulse Rate 146 !  Resp 20  SpO2 94 %    Latest Reference Range & Units 01/14/24 15:36  WBC 4.0 - 10.5 K/uL 9.4  Hemoglobin 12.0 - 15.0 g/dL 88.2 (L)  HCT 63.9 - 53.9 % 36.0  Platelets 150 - 400 K/uL 405 (H)  Creatinine 0.44 - 1.00 mg/dL 9.22  Flu /covid neg CXR perihilar infiltrate Blood culture sent and she was started on azithromycin  and ceftriaxone  Blood culture came back positive for MRSA MRSA nares was positive Vanco was added  I am seeing her for MRSA bacteremia   Past Medical History:  Diagnosis Date   Age-related macular degeneration, wet, right eye (HCC)    Carotid artery stenosis, asymptomatic, bilateral    last duplex in epic 02-18-2015  bilateral ICA <50%   Chronically dry eyes    Fracture 01/24/2018   right foot-neck of fifth metatarsal    GERD (gastroesophageal reflux disease)    Hiatal hernia    History of colitis 12/16/2016   secondary to champylobacter   History of recurrent TIAs    total x4  last one 12-29-2014---  (01-23-2018 per pt no residual's)   History of syncope    11/ 2018 approx. , cardiac work-up done by dr ladona    Hypertension    Hypothyroidism    followed by pcp   Lower urinary tract symptoms (LUTS)    OA (osteoarthritis)    knees   RLS (restless legs syndrome)    Venous stasis    01-23-2018 per pt elevates feet when sitting , if not toes become numb   Wears glasses    Wears hearing aid in right ear     Past Surgical History:  Procedure  Laterality Date   APPENDECTOMY  AGE 65   CARDIOVASCULAR STRESS TEST  04-28-2017  dr ladona   Low risk nuclear study w/ small sized , mild intensity, reversible apical, inferoapical perfusion defect , no ischemia/  normal LV function and wall motion , EF 75%   CATARACT EXTRACTION W/ INTRAOCULAR LENS  IMPLANT, BILATERAL  2004;  2006   DILATION AND CURETTAGE OF UTERUS  YRS AGO   X3   EYE SURGERY Right 2011 APPROX.   CORNEA (FOR EROSION)   FOOT SURGERY Right 2004   [facility] NE[facility]OMA   FRACTURE SURGERY  TEEN   RIGHT ARM , PNNING   LAPAROSCOPIC CHOLECYSTECTOMY  2011  APPROX.   TONSILLECTOMY  CHILD   TOTAL KNEE ARTHROPLASTY Right 2010   TOTAL KNEE ARTHROPLASTY Left 01/29/2018   Procedure: LEFT TOTAL KNEE ARTHROPLASTY;  Surgeon: Melodi Lerner, MD;  Location: [facility];  Service: Orthopedics;  Laterality: Left;   TRANSTHORACIC ECHOCARDIOGRAM  05-03-2017   dr ladona   moderate concentric LVH, ef 66%/  mild to moderate MR with moderate MV leaflet calcification with no stenosis/  mild to moderate TR/  PASP   TRIGGER FINGER RELEASE  2005   RIGHT THUMB    Social History  Socioeconomic History   Marital status: Widowed    Spouse name: Not on file   Number of children: Not on file   Years of education: Not on file   Highest education level: Not on file  Occupational History   Not on file  Tobacco Use   Smoking status: Former    Current packs/day: 0.00    Types: Cigarettes    Start date: 01/23/1990    Quit date: 01/24/2016    Years since quitting: 7.9   Smokeless tobacco: Never  Vaping Use   Vaping status: Never Used  Substance and Sexual Activity   Alcohol use: Yes    Alcohol/week: 12.0 - 14.0 standard drinks of alcohol    Types: 12 - 14 Standard drinks or equivalent per week    Comment: 1-2 drink per night   Drug use: No   Sexual activity: Not on file  Other Topics Concern   Not on file  Social History Narrative   Not on file   Social Drivers of Health   Financial Resource  Strain: Not on file  Food Insecurity: Not on file  Transportation Needs: Not on file  Physical Activity: Not on file  Stress: Not on file  Social Connections: Not on file  Intimate Partner Violence: Not on file    Family History  Problem Relation Age of Onset   Diabetes Mother    Hyperlipidemia Father    Allergies  Allergen Reactions   Calcium -Containing Compounds Other (See Comments)    Restless legs   Lactose Intolerance (Gi) Diarrhea   I? Current Facility-Administered Medications  Medication Dose Route Frequency Provider Last Rate Last Admin   albuterol  (PROVENTIL ) (2.5 MG/3ML) 0.083% nebulizer solution 3 mL  3 mL Inhalation Q4H PRN Niu, Xilin, MD       apixaban  (ELIQUIS ) tablet 2.5 mg  2.5 mg Oral BID Niu, Xilin, MD   2.5 mg at 01/15/24 1132   ascorbic acid  (VITAMIN C ) tablet 500 mg  500 mg Oral Daily Niu, Xilin, MD   500 mg at 01/15/24 1132   atorvastatin  (LIPITOR) tablet 20 mg  20 mg Oral Daily Niu, Xilin, MD   20 mg at 01/15/24 1132   azithromycin  (ZITHROMAX ) 500 mg in sodium chloride  0.9 % 250 mL IVPB  500 mg Intravenous Q24H Niels Barrio F, RPH       B-complex with vitamin C  tablet 1 tablet  1 tablet Oral Daily Niu, Xilin, MD   1 tablet at 01/15/24 1133   cefTRIAXone  (ROCEPHIN ) 2 g in sodium chloride  0.9 % 100 mL IVPB  2 g Intravenous Q24H Niu, Xilin, MD 200 mL/hr at 01/15/24 1156 2 g at 01/15/24 1156   cholecalciferol  (VITAMIN D3) 25 MCG (1000 UNIT) tablet 1,000 Units  1,000 Units Oral Daily Niu, Xilin, MD   1,000 Units at 01/15/24 1132   dextromethorphan-guaiFENesin  (MUCINEX  DM) 30-600 MG per 12 hr tablet 1 tablet  1 tablet Oral BID PRN Niu, Xilin, MD       diltiazem  (CARDIZEM ) 125 mg in dextrose  5% 125 mL (1 mg/mL) infusion  5-15 mg/hr Intravenous Continuous Viviann Pastor, MD 5 mL/hr at 01/15/24 1055 5 mg/hr at 01/15/24 1055   empagliflozin  (JARDIANCE ) tablet 10 mg  10 mg Oral Daily Niu, Xilin, MD   10 mg at 01/15/24 1133   furosemide  (LASIX ) tablet 40 mg  40 mg  Oral Daily Niu, Xilin, MD   40 mg at 01/15/24 1132   gabapentin  (NEURONTIN ) capsule 100 mg  100 mg Oral TID  Niu, Xilin, MD   100 mg at 01/15/24 1132   hydrOXYzine  (ATARAX ) tablet 10 mg  10 mg Oral Q8H PRN Niu, Xilin, MD       ipratropium-albuterol  (DUONEB) 0.5-2.5 (3) MG/3ML nebulizer solution 3 mL  3 mL Nebulization Q6H Griffith, Kelly A, DO       levothyroxine  (SYNTHROID ) tablet 137 mcg  137 mcg Oral q AM Niu, Xilin, MD   137 mcg at 01/15/24 9396   loratadine  (CLARITIN ) tablet 10 mg  10 mg Oral Daily Niu, Xilin, MD   10 mg at 01/15/24 1132   metoprolol  tartrate (LOPRESSOR ) tablet 25 mg  25 mg Oral Daily Niu, Xilin, MD       multivitamin with minerals tablet 1 tablet  1 tablet Oral Daily Niu, Xilin, MD   1 tablet at 01/15/24 1133   mupirocin  ointment (BACTROBAN ) 2 % 1 Application  1 Application Nasal BID Niu, Xilin, MD   1 Application at 01/15/24 1134   Oral care mouth rinse  15 mL Mouth Rinse PRN Niu, Xilin, MD       predniSONE  (DELTASONE ) tablet 50 mg  50 mg Oral Q breakfast Niu, Xilin, MD   50 mg at 01/15/24 0851   sertraline  (ZOLOFT ) tablet 25 mg  25 mg Oral Daily Niu, Xilin, MD   25 mg at 01/15/24 1133   traZODone  (DESYREL ) tablet 100 mg  100 mg Oral QHS Niu, Xilin, MD   100 mg at 01/14/24 2354   triamcinolone  ointment (KENALOG ) 0.1 % 1 Application  1 Application Topical QHS Niu, Xilin, MD       [START ON 01/16/2024] vancomycin  (VANCOREADY) IVPB 750 mg/150 mL  750 mg Intravenous Q24H Zeigler, Dustin G, RPH         Abtx:  Anti-infectives (From admission, onward)    Start     Dose/Rate Route Frequency Ordered Stop   01/16/24 1000  vancomycin  (VANCOREADY) IVPB 750 mg/150 mL        750 mg 150 mL/hr over 60 Minutes Intravenous Every 24 hours 01/15/24 0830     01/15/24 1300  azithromycin  (ZITHROMAX ) 500 mg in sodium chloride  0.9 % 250 mL IVPB        500 mg 250 mL/hr over 60 Minutes Intravenous Every 24 hours 01/15/24 1142     01/15/24 1000  azithromycin  (ZITHROMAX ) 500 mg in sodium chloride   0.9 % 250 mL IVPB  Status:  Discontinued        500 mg 250 mL/hr over 60 Minutes Intravenous Every 24 hours 01/14/24 1907 01/15/24 1142   01/15/24 1000  cefTRIAXone  (ROCEPHIN ) 2 g in sodium chloride  0.9 % 100 mL IVPB        2 g 200 mL/hr over 30 Minutes Intravenous Every 24 hours 01/14/24 1907     01/15/24 0930  vancomycin  (VANCOREADY) IVPB 1250 mg/250 mL        1,250 mg 166.7 mL/hr over 90 Minutes Intravenous  Once 01/15/24 0830 01/15/24 1155   01/14/24 1545  cefTRIAXone  (ROCEPHIN ) 2 g in sodium chloride  0.9 % 100 mL IVPB        2 g 200 mL/hr over 30 Minutes Intravenous Once 01/14/24 1535 01/14/24 1632   01/14/24 1545  azithromycin  (ZITHROMAX ) 500 mg in sodium chloride  0.9 % 250 mL IVPB        500 mg 250 mL/hr over 60 Minutes Intravenous  Once 01/14/24 1535 01/14/24 1738       REVIEW OF SYSTEMS:  Const:  fever, chills, negative weight loss Eyes:  negative diplopia or visual changes, negative eye pain ENT: negative coryza, negative sore throat Resp:  cough,  dyspnea Cards: negative for chest pain, has palpitations, lower extremity edema GU: negative for frequency, dysuria and hematuria GI: Negative for abdominal pain, diarrhea, bleeding, constipation Skin: negative for rash and pruritus Heme: negative for easy bruising and gum/nose bleeding MS:  weakness Neurolo:negative for headaches, dizziness, vertigo, memory problems  Psych: negative for feelings of anxiety, depression  Endocrine: negative for thyroid , diabetes Allergy/Immunology- as above: Objective:  VITALS:  BP 113/70   Pulse 88   Temp 98.1 F (36.7 C) (Oral)   Resp 20   Ht 5' 6.5 (1.689 m)   Wt 56.3 kg   SpO2 98%   BMI 19.73 kg/m  LDA Foley Central line Other drainage tubes PHYSICAL EXAM:  General: Alert, cooperative, no distress, oriented x 4  Head: Normocephalic, without obvious abnormality, atraumatic. Eyes: Conjunctivae clear, anicteric sclerae. Pupils are equal ENT Nares normal. No drainage or sinus  tenderness.hard of hearing Lips, mucosa, and tongue normal. No Thrush Neck: Supple, symmetrical, no adenopathy, thyroid : non tender no carotid bruit and no JVD. Back: No CVA tenderness. Lungs: b/l air enry- crepts bases. Heart: irregular RVR Abdomen: Soft, non-tender,not distended. Bowel sounds normal. No masses Extremities: atraumatic, no cyanosis. No edema. No clubbing Skin: No rashes or lesions. Or bruising Lymph: Cervical, supraclavicular normal. Neurologic: Grossly non-focal Pertinent Labs Lab Results CBC    Component Value Date/Time   WBC 7.5 01/15/2024 0528   RBC 3.59 (L) 01/15/2024 0528   HGB 10.9 (L) 01/15/2024 0528   HCT 34.3 (L) 01/15/2024 0528   PLT 385 01/15/2024 0528   MCV 95.5 01/15/2024 0528   MCH 30.4 01/15/2024 0528   MCHC 31.8 01/15/2024 0528   RDW 15.9 (H) 01/15/2024 0528   LYMPHSABS 0.9 01/14/2024 1536   MONOABS 1.0 01/14/2024 1536   EOSABS 0.1 01/14/2024 1536   BASOSABS 0.1 01/14/2024 1536       Latest Ref Rng & Units 01/15/2024    5:28 AM 01/14/2024    3:36 PM 01/31/2018    5:09 AM  CMP  Glucose 70 - 99 mg/dL 835  856  860   BUN 8 - 23 mg/dL 18  14  7    Creatinine 0.44 - 1.00 mg/dL 9.35  9.22  9.53   Sodium 135 - 145 mmol/L 139  135  140   Potassium 3.5 - 5.1 mmol/L 4.0  3.6  3.3   Chloride 98 - 111 mmol/L 104  98  103   CO2 22 - 32 mmol/L 21  27  28    Calcium  8.9 - 10.3 mg/dL 8.7  8.5  9.1   Total Protein 6.5 - 8.1 g/dL  6.8    Total Bilirubin 0.0 - 1.2 mg/dL  0.6    Alkaline Phos 38 - 126 U/L  137    AST 15 - 41 U/L  32    ALT 0 - 44 U/L  28        Microbiology: Recent Results (from the past 240 hours)  Blood Culture (routine x 2)     Status: None (Preliminary result)   Collection Time: 01/14/24  3:36 PM   Specimen: Right Antecubital; Blood  Result Value Ref Range Status   Specimen Description RIGHT ANTECUBITAL  Final   Special Requests   Final    BOTTLES DRAWN AEROBIC AND ANAEROBIC Blood Culture adequate volume   Culture  Setup  Time   Final    GRAM POSITIVE  COCCI AEROBIC BOTTLE ONLY Organism ID to follow CRITICAL RESULT CALLED TO, READ BACK BY AND VERIFIED WITHBETHA MOSE BLEW [facility] [id number] 01/15/24 HNM    Culture   Final    NO GROWTH < 12 HOURS Performed at [facility], [address] [place], [place] [address]    Report Status PENDING  Incomplete  Blood Culture ID Panel (Reflexed)     Status: Abnormal   Collection Time: 01/14/24  3:36 PM  Result Value Ref Range Status   Enterococcus faecalis NOT DETECTED NOT DETECTED Final   Enterococcus Faecium NOT DETECTED NOT DETECTED Final   Listeria monocytogenes NOT DETECTED NOT DETECTED Final   Staphylococcus species DETECTED (A) NOT DETECTED Final    Comment: CRITICAL RESULT CALLED TO, READ BACK BY AND VERIFIED WITH: MOSE BLEW PHARMD 9193 01/15/24 HNM    Staphylococcus aureus (BCID) DETECTED (A) NOT DETECTED Final    Comment: Methicillin (oxacillin)-resistant Staphylococcus aureus (MRSA). MRSA is predictably resistant to beta-lactam antibiotics (except ceftaroline). Preferred therapy is vancomycin  unless clinically contraindicated. Patient requires contact precautions if  hospitalized. CRITICAL RESULT CALLED TO, READ BACK BY AND VERIFIED WITH: MOSE BLEW PHARMD 9193 01/15/24 HNM    Staphylococcus epidermidis NOT DETECTED NOT DETECTED Final   Staphylococcus lugdunensis NOT DETECTED NOT DETECTED Final   Streptococcus species NOT DETECTED NOT DETECTED Final   Streptococcus agalactiae NOT DETECTED NOT DETECTED Final   Streptococcus pneumoniae NOT DETECTED NOT DETECTED Final   Streptococcus pyogenes NOT DETECTED NOT DETECTED Final   A.calcoaceticus-baumannii NOT DETECTED NOT DETECTED Final   Bacteroides fragilis NOT DETECTED NOT DETECTED Final   Enterobacterales NOT DETECTED NOT DETECTED Final   Enterobacter cloacae complex NOT DETECTED NOT DETECTED Final   Escherichia coli NOT DETECTED NOT DETECTED Final   Klebsiella aerogenes NOT DETECTED NOT  DETECTED Final   Klebsiella oxytoca NOT DETECTED NOT DETECTED Final   Klebsiella pneumoniae NOT DETECTED NOT DETECTED Final   Proteus species NOT DETECTED NOT DETECTED Final   Salmonella species NOT DETECTED NOT DETECTED Final   Serratia marcescens NOT DETECTED NOT DETECTED Final   Haemophilus influenzae NOT DETECTED NOT DETECTED Final   Neisseria meningitidis NOT DETECTED NOT DETECTED Final   Pseudomonas aeruginosa NOT DETECTED NOT DETECTED Final   Stenotrophomonas maltophilia NOT DETECTED NOT DETECTED Final   Candida albicans NOT DETECTED NOT DETECTED Final   Candida auris NOT DETECTED NOT DETECTED Final   Candida glabrata NOT DETECTED NOT DETECTED Final   Candida krusei NOT DETECTED NOT DETECTED Final   Candida parapsilosis NOT DETECTED NOT DETECTED Final   Candida tropicalis NOT DETECTED NOT DETECTED Final   Cryptococcus neoformans/gattii NOT DETECTED NOT DETECTED Final   Meth resistant mecA/C and MREJ DETECTED (A) NOT DETECTED Final    Comment: CRITICAL RESULT CALLED TO, READ BACK BY AND VERIFIED WITHBETHA MOSE BLEW [facility] 9193 01/15/24 HNM Performed at [facility], [address] [place], [place] [address]   Blood Culture (routine x 2)     Status: None (Preliminary result)   Collection Time: 01/14/24  3:51 PM   Specimen: BLOOD RIGHT FOREARM  Result Value Ref Range Status   Specimen Description BLOOD RIGHT FOREARM  Final   Special Requests   Final    BOTTLES DRAWN AEROBIC AND ANAEROBIC Blood Culture results may not be optimal due to an inadequate volume of blood received in culture bottles   Culture  Setup Time   Final    GRAM POSITIVE COCCI ANAEROBIC BOTTLE ONLY CRITICAL VALUE NOTED.  VALUE IS CONSISTENT WITH  PREVIOUSLY REPORTED AND CALLED VALUE.    Culture   Final    NO GROWTH < 12 HOURS Performed at [facility], [address] [place], [place] [address]    Report Status PENDING  Incomplete  Resp panel by RT-PCR (RSV, Flu A&B, Covid) Anterior  Nasal Swab     Status: None   Collection Time: 01/14/24  3:52 PM   Specimen: Anterior Nasal Swab  Result Value Ref Range Status   SARS Coronavirus 2 by RT PCR NEGATIVE NEGATIVE Final    Comment: (NOTE) SARS-CoV-2 target nucleic acids are NOT DETECTED.  The SARS-CoV-2 RNA is generally detectable in upper respiratory specimens during the acute phase of infection. The lowest concentration of SARS-CoV-2 viral copies this assay can detect is 138 copies/mL. A negative result does not preclude SARS-Cov-2 infection and should not be used as the sole basis for treatment or other patient management decisions. A negative result may occur with  improper specimen collection/handling, submission of specimen other than nasopharyngeal swab, presence of viral mutation(s) within the areas targeted by this assay, and inadequate number of viral copies(<138 copies/mL). A negative result must be combined with clinical observations, patient history, and epidemiological information. The expected result is Negative.  Fact Sheet for Patients:  BloggerCourse.com  Fact Sheet for Healthcare Providers:  SeriousBroker.it  This test is no t yet approved or cleared by the [place]  FDA and  has been authorized for detection and/or diagnosis of SARS-CoV-2 by FDA under an Emergency Use Authorization (EUA). This EUA will remain  in effect (meaning this test can be used) for the duration of the COVID-19 declaration under Section 564(b)(1) of the Act, 21 U.S.C.section 360bbb-3(b)(1), unless the authorization is terminated  or revoked sooner.       Influenza A by PCR NEGATIVE NEGATIVE Final   Influenza B by PCR NEGATIVE NEGATIVE Final    Comment: (NOTE) The Xpert Xpress SARS-CoV-2/FLU/RSV plus assay is intended as an aid in the diagnosis of influenza from Nasopharyngeal swab specimens and should not be used as a sole basis for treatment. Nasal washings  and aspirates are unacceptable for Xpert Xpress SARS-CoV-2/FLU/RSV testing.  Fact Sheet for Patients: BloggerCourse.com  Fact Sheet for Healthcare Providers: SeriousBroker.it  This test is not yet approved or cleared by the [place]  FDA and has been authorized for detection and/or diagnosis of SARS-CoV-2 by FDA under an Emergency Use Authorization (EUA). This EUA will remain in effect (meaning this test can be used) for the duration of the COVID-19 declaration under Section 564(b)(1) of the Act, 21 U.S.C. section 360bbb-3(b)(1), unless the authorization is terminated or revoked.     Resp Syncytial Virus by PCR NEGATIVE NEGATIVE Final    Comment: (NOTE) Fact Sheet for Patients: BloggerCourse.com  Fact Sheet for Healthcare Providers: SeriousBroker.it  This test is not yet approved or cleared by the [place]  FDA and has been authorized for detection and/or diagnosis of SARS-CoV-2 by FDA under an Emergency Use Authorization (EUA). This EUA will remain in effect (meaning this test can be used) for the duration of the COVID-19 declaration under Section 564(b)(1) of the Act, 21 U.S.C. section 360bbb-3(b)(1), unless the authorization is terminated or revoked.  Performed at [facility], [address] [place], [place] [address]   MRSA Next Gen by PCR, Nasal     Status: Abnormal   Collection Time: 01/14/24 10:24 PM   Specimen: Nasal Mucosa; Nasal Swab  Result Value Ref Range Status   MRSA by PCR Next Gen  DETECTED (A) NOT DETECTED Final    Comment: RESULT CALLED TO, READ BACK BY AND VERIFIED WITH: KARA CAMPBELL, RN @1254  01/15/2024 COP (NOTE) The GeneXpert MRSA Assay (FDA approved for NASAL specimens only), is one component of a comprehensive MRSA colonization surveillance program. It is not intended to diagnose MRSA infection nor to guide or monitor treatment  for MRSA infections. Test performance is not FDA approved in patients less than 42 years old. Performed at [facility], [address] [place], [place] [address]       IMAGING RESULTS: CXR  I have personally reviewed the films ?peribronchial thickening with hazy perihilar infiltrate  Impression/Recommendation ?Presenting with Cugh, SOB, fever  Lower respiratory infection with bronchitis and possible pneumonia due to metapneumonvirus Respiratory PCR checked --meta pneumovirus positive O ceftriaxone  and azithromycin - can be DC  Secondarily infected with MRSA  MRSA bacteremia source likely the lung On vanco,  2 d echo no veg Will need TEE  Afib with RVR  ?LEFT TKA  This consult involved complex antimicrobial management and infection prevention protocol for MRSA ? I have personally spent  -75--minutes involved in face-to-face and non-face-to-face activities for this patient on the day of the visit. Professional time spent includes the following activities: Preparing to see the patient (review of tests), Obtaining and/or reviewing separately obtained history (admission/discharge record), Performing a medically appropriate examination and/or evaluation , Ordering medications/tests/procedures, referring and communicating with other health care professionals, Documenting clinical information in the EMR, Independently interpreting results (not separately reported), Communicating results to the patient/family/caregiver, Counseling and educating the patient/family/caregiver and Care coordination (not separately reported).    ________________________________________________  Note:  This document was prepared using Conservation officer, historic buildings and may include unintentional dictation errors.

## 2024-01-15 NOTE — Progress Notes (Signed)
*  PRELIMINARY RESULTS* Echocardiogram 2D Echocardiogram has been performed.  Diane Chambers 01/15/2024, 2:25 PM

## 2024-01-15 NOTE — Progress Notes (Signed)
*  PRELIMINARY RESULTS* Echocardiogram 2D Echocardiogram has been performed.  Diane Chambers 01/15/2024, 2:26 PM

## 2024-01-15 NOTE — Progress Notes (Signed)
 Progress Note   Patient: Diane Chambers FMW:[id number] DOB: [birth year] DOA: 01/14/2024     1 DOS: the patient was seen and examined on 01/15/2024   Brief hospital course:  HPI on admission: Diane Chambers is a 89 y.o. female with medical history significant of A fib on Eliquis , dCHF, HTN, HLD, multiple stroke with left arm weakness, vascular dementia, hypothyroidism, colitis, carotid artery stenosis, who presents with SOB.   Per patient and her son at the bedside, patient has generalized weakness and decreased appetite and oral intake for more than 3 days.  Since this morning, patient developed SOB, which has been progressively worsening.  Patient has dry cough, no chest pain. Patient has fever and chills.  Her temperature is 100.7 in ED.  Per report, patient has oxygen desaturation to 87% in facility.  He is oxygen saturation is 94% in the ED.  Her son states that patient's heart rate was up to 175 earlier.  Her heart rate is 140-160 in ED.... See H&P for full HPI on admission & ED course.  Patient was admitted for further evaluation and management of acute bronchitis complicated by A-fib with RVR requiring Cardizem  infusion for heart rate control.  Pt was started on Rocephin  and Zithromax .  On next hospital day, blood cultures positive for staph, as well as MRSA positive nares PCR screen.  Vancomycin  was added and Infectious disease consulted.  Further hospital course and management as outlined below.   Assessment and Plan:  Severe Sepsis due to acute bronchitis and possible Staph bacteremia: Pt met criteria for severe sepsis on admission with fever 100.7 and tachycardia with A-fib with RVR.  Lactic acid elevated 2.6-->  1.6. --Continue Rocephin , Zithromax  --Vancomycin  added 8/25 --ID is consulted --Follow sputum cultures --Follow blood cultures & obtain repeats --IS, flutter for pulmonary hygiene --Mucolytics PRN --Prednisone  --Duonebs  Chronic atrial fibrillation with RVR   --Continue Eliquis  --Increase home metoprolol  25 mg daily to 50 mg BID (Unclear reason for once daily dosing for metoprolol  tartrate as is home dose) --Continue Cardizem  drip for now with HR's fluctuating up to 120's still today, wean off when HRs allow --Telemetry --Maintain K>4, Mg>2 --Mgmt of acute illness above, precipitating RVR    Chronic diastolic CHF (congestive heart failure) (HCC): 2D echo on 12/30/2014 showed EF of 55 to 60% with grade 1 diastolic dysfunction.  Patient has had elevated BNP of 293.  Received 1.5 L normal saline in ED due to lactic acidosis.    Echo this admission -- EF 65-70%, indeterminate diastolic paramters, mild-mod MR, mild-mod TR, mild AR  --Given 40 mg of Lasix  IV x 1 on admission --Continue oral Lasix  to 40 mg daily --Monitor volume status, renal function & electrolytes  Myocardial injury: Troponin 35 --> 34, likely demand ischemia due to Afib RVR and sepsis --deferred aspirin  since patient is on Eliquis  --Lipitor     Hypothyroidism: TSH and T4 normal -Continue Synthroid  175 mcg daily   TIA (transient ischemic attack) -On Eliquis  and Lipitor   Essential hypertension - IV hydralazine as needed - Metoprolol    HLD (hyperlipidemia) - Lipitor       Subjective: Pt awake sitting up in bed this AM.  She reports feeling slightly better.  Has intermittent cough that is dry but intermittently productive.  Denies fever/chills today.  No other acute complaints.  Denies chest pain or palpitations.  Physical Exam: Vitals:   01/15/24 0900 01/15/24 1000 01/15/24 1100 01/15/24 1230  BP: (!) 120/98 124/69 113/70 (!) 142/86  Pulse:   88 (!) 136  Resp: 20 18 20  (!) 21  Temp:    98.2 F (36.8 C)  TempSrc:    Oral  SpO2: 100% 95% 98% 97%  Weight:      Height:       General exam: awake, alert, no acute distress HEENT: moist mucus membranes, hearing grossly normal  Respiratory system: CTAB, intermittent mild expiratory wheeze anteriorly, no rhonchi,  normal respiratory effort at rest on room air. Cardiovascular system: normal S1/S2, RRR, no JVD, murmurs, rubs, gallops, no pedal edema.   Gastrointestinal system: soft, NT, ND, no HSM felt, +bowel sounds. Central nervous system: A&O x2+. no gross focal neurologic deficits, normal speech Skin: dry, intact, normal temperature Psychiatry: normal mood, congruent affect, judgement and insight appear normal   Data Reviewed:  Notable labs --  Bicarb 21 Glucose 164 Ca 8.7 Hbg stable 10.9 Normal WBC 7.5  MRSA PCR positive  Blood culture -- growing staph species - pending  Echo -- EF 65-70%, indeterminate diastolic paramters, mild-mod MR, mild-mod TR, mild AR   Family Communication: none present. Will attempt to call as time allows.  Disposition: Status is: Inpatient Remains inpatient appropriate because: remains on IV therapies as above, ongoing evaluation   Planned Discharge Destination: Home    Time spent: 45 minutes  Author: Burnard DELENA Cunning, DO 01/15/2024 6:07 PM  For on call review [email or web address].

## 2024-01-15 NOTE — Progress Notes (Signed)
 Pharmacy Antibiotic Note  Diane Chambers is a 89 y.o. female admitted on 01/14/2024 with fever and shortness and found to have MRSA bacteremia.  Pharmacy has been consulted for vancomycin  dosing.  Today, 01/15/2024 D1 vancomycin  D2 ceftriaxone /azithromycin  for bronchitis Renal: SCr 0.64  WBC WNL Tm/24h 100.7 8/24 blood culture: 1/4 GPC, BCID MRSA  Plan: Vancomycin  1250mg  IV x 1 loading dose then 750mg  IV q24h  Goal AUC 400-600 Predicted AUC 460, using actual BW (as weighs less than IBW), SCr rounding to 0.8 Monitor renal function Check vancomycin  levels when approach steady state Follow-up ID evaluation   Height: 5' 6.5 (168.9 cm) Weight: 56.3 kg (124 lb 1.9 oz) IBW/kg (Calculated) : 60.45  Temp (24hrs), Avg:98.9 F (37.2 C), Min:98.1 F (36.7 C), Max:100.7 F (38.2 C)  Recent Labs  Lab 01/14/24 1536 01/14/24 1706 01/15/24 0528  WBC 9.4  --  7.5  CREATININE 0.77  --  0.64  LATICACIDVEN 2.6* 1.6  --     Estimated Creatinine Clearance: 43.2 mL/min (by C-G formula based on SCr of 0.64 mg/dL).    Allergies  Allergen Reactions   Calcium -Containing Compounds Other (See Comments)    Restless legs   Lactose Intolerance (Gi) Diarrhea    Antimicrobials this admission: 8/24 ceftriaxone  >>  8/24 azithromycin  >>  8/25 vancomycin  >>  Thank you for allowing pharmacy to be a part of this patient's care. Elvena Oyer, PharmD, BCPS, BCIDP Work Cell: [phone] 01/15/2024 8:29 AM

## 2024-01-15 NOTE — Progress Notes (Signed)
 PHARMACY - PHYSICIAN COMMUNICATION CRITICAL VALUE ALERT - BLOOD CULTURE IDENTIFICATION (BCID)  Diane Chambers is an 89 y.o. female who presented to [facility] on 01/14/2024 with a chief complaint of fever and shortness of breath  Assessment: blood cultures from 8/24 with GPC,  BCID detects MRSA,  source unclear  Name of physician (or Provider) Contacted: Drs Fausto and Fayette  Current antibiotics: ceftriaxone /azithromycin   Changes to prescribed antibiotics recommended:  Recommendations accepted by provider - add vancomycin   Auto-ID consult  Results for orders placed or performed during the hospital encounter of 01/14/24  Blood Culture ID Panel (Reflexed) (Collected: 01/14/2024  3:36 PM)  Result Value Ref Range   Enterococcus faecalis NOT DETECTED NOT DETECTED   Enterococcus Faecium NOT DETECTED NOT DETECTED   Listeria monocytogenes NOT DETECTED NOT DETECTED   Staphylococcus species DETECTED (A) NOT DETECTED   Staphylococcus aureus (BCID) DETECTED (A) NOT DETECTED   Staphylococcus epidermidis NOT DETECTED NOT DETECTED   Staphylococcus lugdunensis NOT DETECTED NOT DETECTED   Streptococcus species NOT DETECTED NOT DETECTED   Streptococcus agalactiae NOT DETECTED NOT DETECTED   Streptococcus pneumoniae NOT DETECTED NOT DETECTED   Streptococcus pyogenes NOT DETECTED NOT DETECTED   A.calcoaceticus-baumannii NOT DETECTED NOT DETECTED   Bacteroides fragilis NOT DETECTED NOT DETECTED   Enterobacterales NOT DETECTED NOT DETECTED   Enterobacter cloacae complex NOT DETECTED NOT DETECTED   Escherichia coli NOT DETECTED NOT DETECTED   Klebsiella aerogenes NOT DETECTED NOT DETECTED   Klebsiella oxytoca NOT DETECTED NOT DETECTED   Klebsiella pneumoniae NOT DETECTED NOT DETECTED   Proteus species NOT DETECTED NOT DETECTED   Salmonella species NOT DETECTED NOT DETECTED   Serratia marcescens NOT DETECTED NOT DETECTED   Haemophilus influenzae NOT DETECTED NOT DETECTED   Neisseria  meningitidis NOT DETECTED NOT DETECTED   Pseudomonas aeruginosa NOT DETECTED NOT DETECTED   Stenotrophomonas maltophilia NOT DETECTED NOT DETECTED   Candida albicans NOT DETECTED NOT DETECTED   Candida auris NOT DETECTED NOT DETECTED   Candida glabrata NOT DETECTED NOT DETECTED   Candida krusei NOT DETECTED NOT DETECTED   Candida parapsilosis NOT DETECTED NOT DETECTED   Candida tropicalis NOT DETECTED NOT DETECTED   Cryptococcus neoformans/gattii NOT DETECTED NOT DETECTED   Meth resistant mecA/C and MREJ DETECTED (A) NOT DETECTED    Celestine Slovak, PharmD, BCPS, BCIDP Work Cell: [phone] 01/15/2024 8:14 AM

## 2024-01-16 ENCOUNTER — Encounter: Payer: Self-pay | Admitting: Internal Medicine

## 2024-01-16 DIAGNOSIS — I482 Chronic atrial fibrillation, unspecified: Secondary | ICD-10-CM

## 2024-01-16 DIAGNOSIS — R7881 Bacteremia: Secondary | ICD-10-CM | POA: Diagnosis not present

## 2024-01-16 DIAGNOSIS — B9562 Methicillin resistant Staphylococcus aureus infection as the cause of diseases classified elsewhere: Secondary | ICD-10-CM

## 2024-01-16 DIAGNOSIS — I4891 Unspecified atrial fibrillation: Principal | ICD-10-CM

## 2024-01-16 DIAGNOSIS — I5032 Chronic diastolic (congestive) heart failure: Secondary | ICD-10-CM | POA: Diagnosis not present

## 2024-01-16 DIAGNOSIS — J123 Human metapneumovirus pneumonia: Secondary | ICD-10-CM

## 2024-01-16 LAB — BASIC METABOLIC PANEL WITH GFR
Anion gap: 6 (ref 5–15)
BUN: 27 mg/dL — ABNORMAL HIGH (ref 8–23)
CO2: 26 mmol/L (ref 22–32)
Calcium: 8.8 mg/dL — ABNORMAL LOW (ref 8.9–10.3)
Chloride: 105 mmol/L (ref 98–111)
Creatinine, Ser: 0.58 mg/dL (ref 0.44–1.00)
GFR, Estimated: >60 mL/min (ref >60)
Glucose, Bld: 124 mg/dL — ABNORMAL HIGH (ref 70–99)
Potassium: 3.8 mmol/L (ref 3.5–5.1)
Sodium: 137 mmol/L (ref 135–145)

## 2024-01-16 LAB — MAGNESIUM: Magnesium: 2.1 mg/dL (ref 1.7–2.4)

## 2024-01-16 LAB — PROCALCITONIN: Procalcitonin: <0.1 ng/mL

## 2024-01-16 MED ORDER — POTASSIUM CHLORIDE CRYS ER 20 MEQ PO TBCR
20.0000 meq | EXTENDED_RELEASE_TABLET | Freq: Once | ORAL | Status: AC
  Administered 2024-01-16: 20 meq via ORAL
  Filled 2024-01-16: qty 1

## 2024-01-16 MED ORDER — PREDNISONE 20 MG PO TABS
20.0000 mg | ORAL_TABLET | Freq: Every day | ORAL | Status: AC
  Administered 2024-01-17: 20 mg via ORAL
  Filled 2024-01-16: qty 1

## 2024-01-16 MED ORDER — IPRATROPIUM-ALBUTEROL 0.5-2.5 (3) MG/3ML IN SOLN
3.0000 mL | Freq: Two times a day (BID) | RESPIRATORY_TRACT | Status: DC
  Administered 2024-01-16 – 2024-01-22 (×11): 3 mL via RESPIRATORY_TRACT
  Filled 2024-01-16 (×11): qty 3

## 2024-01-16 MED ORDER — SODIUM CHLORIDE 0.9 % IV SOLN: INTRAVENOUS | Status: DC

## 2024-01-16 NOTE — Evaluation (Signed)
 Physical Therapy Evaluation Patient Details Name: Diane Chambers MRN: [id number] DOB: 1933-12-14 Today's Date: 01/16/2024  History of Present Illness  Pt is an 89 y.o. female with medical history significant of A fib on Eliquis , dCHF, HTN, HLD, multiple stroke with left arm weakness, vascular dementia, hypothyroidism, colitis, carotid artery stenosis, who presents with SOB. MD assessment includes: severe sepsis due to acute bronchitis and possible staph bacteremia, chronic atrial fibrillation with RVR, and myocardial injury with troponin 35, likely demand ischemia.   Clinical Impression  Pt was pleasantly confused but motivated to participate during the session and put forth good effort throughout. Pt required physical assistance along with cuing for proper sequencing during both transfer and gait training.  Pt presented with poor stability both in sitting and standing but pt did respond to balance training activities in standing with gross improvement of stability noted.  Pt was able to take several steps during the session but unable to ambulate functionally from a distance or stability standpoint.  Pt presents with a decline in functional status compared to her recent baseline while participating with therapy at a SNF with baseline provided by pt's son.  Pt will benefit from continued PT services upon discharge to safely address deficits listed in patient problem list for decreased caregiver assistance and eventual return to PLOF.          If plan is discharge home, recommend the following: A lot of help with walking and/or transfers;A lot of help with bathing/dressing/bathroom;Assistance with cooking/housework;Direct supervision/assist for medications management;Direct supervision/assist for financial management;Supervision due to cognitive status;Assist for transportation   Can travel by private vehicle   No    Equipment Recommendations Other (comment) (TBD at next venue of care)   Recommendations for Other Services       Functional Status Assessment Patient has had a recent decline in their functional status and demonstrates the ability to make significant improvements in function in a reasonable and predictable amount of time.     Precautions / Restrictions Precautions Precautions: Fall Restrictions Weight Bearing Restrictions Per Provider Order: No      Mobility  Bed Mobility               General bed mobility comments: NT, pt in recliner pre/post session    Transfers Overall transfer level: Needs assistance Equipment used: Rolling walker (2 wheels) Transfers: Sit to/from Stand Sit to Stand: Mod assist           General transfer comment: Mod A and verbal and tactile cues for sequencing to come to standing from recliner with min to mod A to prevent posterior and L lateral LOB while in standing initially, balance grossly improved as session progressed    Ambulation/Gait Ambulation/Gait assistance: Min assist, Mod assist Gait Distance (Feet): 3 Feet Assistive device: Rolling walker (2 wheels) Gait Pattern/deviations: Step-to pattern, Decreased step length - right, Decreased stance time - left Gait velocity: decreased     General Gait Details: Min to Mod A for stability with cues for step-to sequencing druing ambulation near the recliner  Stairs            Wheelchair Mobility     Tilt Bed    Modified Rankin (Stroke Patients Only)       Balance Overall balance assessment: Needs assistance   Sitting balance-Leahy Scale: Poor     Standing balance support: Bilateral upper extremity supported, During functional activity, Reliant on assistive device for balance Standing balance-Leahy Scale: Poor  Pertinent Vitals/Pain Pain Assessment Pain Assessment: No/denies pain    Home Living Family/patient expects to be discharged to:: Skilled nursing facility                    Additional Comments: History provided by son secondary to pt being a poor historian; patient with CVA while living in [place]  and discharged from there to [facility] to be closer to son; pt has a room reserved at [facility] memory care once appropriate    Prior Function Prior Level of Function : Needs assist             Mobility Comments: Per son while working with PT at [facility] pt was able to stand without physical assist and ambulate room distances with staff and a RW ADLs Comments: Assist PRN from SNF staff     Extremity/Trunk Assessment   Upper Extremity Assessment Upper Extremity Assessment: Generalized weakness;LUE deficits/detail LUE Deficits / Details: History of LUE weakness due to CVA    Lower Extremity Assessment Lower Extremity Assessment: Generalized weakness;LLE deficits/detail LLE Deficits / Details: History of LLE weakness due to CVA       Communication   Communication Communication: No apparent difficulties    Cognition Arousal: Alert Behavior During Therapy: WFL for tasks assessed/performed   PT - Cognitive impairments: No family/caregiver present to determine baseline, Orientation   Orientation impairments: Place, Situation                   PT - Cognition Comments: Pt alert to self and date only Following commands: Impaired Following commands impaired: Follows one step commands with increased time     Cueing Cueing Techniques: Verbal cues, Tactile cues     General Comments      Exercises Other Exercises Other Exercises: Anterior and R lateral weight shifting activities in standing to address L lateral and posterior instability   Assessment/Plan    PT Assessment Patient needs continued PT services  PT Problem List Decreased strength;Decreased activity tolerance;Decreased balance;Decreased mobility;Decreased knowledge of use of DME       PT Treatment Interventions DME instruction;Gait training;Functional mobility  training;Therapeutic activities;Therapeutic exercise;Balance training;Neuromuscular re-education;Patient/family education    PT Goals (Current goals can be found in the Care Plan section)  Acute Rehab PT Goals Patient Stated Goal: To walk better PT Goal Formulation: With patient Time For Goal Achievement: 01/29/24 Potential to Achieve Goals: Good    Frequency Min 2X/week     Co-evaluation               AM-PAC PT 6 Clicks Mobility  Outcome Measure Help needed turning from your back to your side while in a flat bed without using bedrails?: A Lot Help needed moving from lying on your back to sitting on the side of a flat bed without using bedrails?: A Lot Help needed moving to and from a bed to a chair (including a wheelchair)?: A Lot Help needed standing up from a chair using your arms (e.g., wheelchair or bedside chair)?: A Lot Help needed to walk in hospital room?: A Lot Help needed climbing 3-5 steps with a railing? : Total 6 Click Score: 11    End of Session Equipment Utilized During Treatment: Gait belt Activity Tolerance: Patient tolerated treatment well Patient left: in chair;with call bell/phone within reach;with chair alarm set Nurse Communication: Mobility status PT Visit Diagnosis: Unsteadiness on feet (R26.81);Difficulty in walking, not elsewhere classified (R26.2);Muscle weakness (generalized) (M62.81);Hemiplegia and hemiparesis  Time: 8945-8869 PT Time Calculation (min) (ACUTE ONLY): 36 min   Charges:   PT Evaluation $PT Eval Moderate Complexity: 1 Mod PT Treatments $Therapeutic Activity: 8-22 mins PT General Charges $$ ACUTE PT VISIT: 1 Visit    D. Glendia Bertin PT, DPT 01/16/24, 12:18 PM

## 2024-01-16 NOTE — Evaluation (Signed)
 Occupational Therapy Evaluation Patient Details Name: Diane Chambers MRN: [id number] DOB: 1933-09-03 Today's Date: 01/16/2024   History of Present Illness   Pt is an 89 y.o. female with medical history significant of A fib on Eliquis , dCHF, HTN, HLD, multiple stroke with left arm weakness, vascular dementia, hypothyroidism, colitis, carotid artery stenosis, who presents with SOB. MD assessment includes: severe sepsis due to acute bronchitis and possible staph bacteremia, chronic atrial fibrillation with RVR, and myocardial injury with troponin 35, likely demand ischemia.     Clinical Impressions Chart reviewed to date, pt greeted in chair, oriented to self and place. PTA pt was at rehab, per chart review was amb with RW and required assist for ADLs from staff. Eventual plan is to transition to memory care per chart review/PT note. STS completed with MIN-MOD A, step pivot transfer to bed with MIN-MOD A. MAX A required for peri care, LB dressing. Pt is performing ADL/functional mobility below PLOF, will benefit from acute OT to address functional deficits and to facilitate optimal ADL performance.     If plan is discharge home, recommend the following:   A lot of help with walking and/or transfers;A lot of help with bathing/dressing/bathroom;Supervision due to cognitive status     Functional Status Assessment   Patient has had a recent decline in their functional status and demonstrates the ability to make significant improvements in function in a reasonable and predictable amount of time.     Equipment Recommendations   Other (comment) (defer to next venue of care)     Recommendations for Other Services         Precautions/Restrictions   Precautions Precautions: Fall Recall of Precautions/Restrictions: Impaired Restrictions Weight Bearing Restrictions Per Provider Order: No     Mobility Bed Mobility Overal bed mobility: Needs Assistance Bed Mobility: Sit to Supine        Sit to supine: Mod assist, HOB elevated        Transfers Overall transfer level: Needs assistance Equipment used: Rolling walker (2 wheels) Transfers: Sit to/from Stand Sit to Stand: Min assist, Mod assist                  Balance Overall balance assessment: Needs assistance   Sitting balance-Leahy Scale: Poor   Postural control: Posterior lean Standing balance support: Bilateral upper extremity supported, During functional activity, Reliant on assistive device for balance Standing balance-Leahy Scale: Poor                             ADL either performed or assessed with clinical judgement   ADL Overall ADL's : Needs assistance/impaired     Grooming: Minimal assistance;Bed level       Lower Body Bathing: Maximal assistance;Bed level       Lower Body Dressing: Maximal assistance;Sit to/from stand Lower Body Dressing Details (indicate cue type and reason): donn underwear Toilet Transfer: Minimal assistance;Moderate assistance;Rolling walker (2 wheels) Toilet Transfer Details (indicate cue type and reason): step pivot to bedside chair with RW with frequent multi modal cues for technique Toileting- Clothing Manipulation and Hygiene: Maximal assistance;Sit to/from stand Toileting - Clothing Manipulation Details (indicate cue type and reason): peri care             Vision         Perception         Praxis         Pertinent Vitals/Pain Pain Assessment Pain Assessment: PAINAD Breathing: normal  Negative Vocalization: none Facial Expression: smiling or inexpressive Body Language: relaxed Consolability: no need to console PAINAD Score: 0 Pain Intervention(s): Monitored during session     Extremity/Trunk Assessment Upper Extremity Assessment Upper Extremity Assessment: Generalized weakness;LUE deficits/detail LUE Deficits / Details: History of LUE weakness due to CVA   Lower Extremity Assessment Lower Extremity Assessment:  Generalized weakness;LLE deficits/detail LLE Deficits / Details: History of LLE weakness due to CVA       Communication Communication Communication: No apparent difficulties   Cognition Arousal: Alert Behavior During Therapy: WFL for tasks assessed/performed Cognition: History of cognitive impairments, No family/caregiver present to determine baseline, Cognition impaired   Orientation impairments: Time, Situation   Memory impairment (select all impairments): Declarative long-term memory Attention impairment (select first level of impairment): Sustained attention Executive functioning impairment (select all impairments): Sequencing, Reasoning, Problem solving                   Following commands: Impaired Following commands impaired: Follows one step commands with increased time     Cueing  General Comments   Cueing Techniques: Verbal cues;Tactile cues  vss throughout; pt with breakdown throughout peri area, nurse in room to address   Exercises Other Exercises Other Exercises: edu re: role of OT, role of rehab, discharge recommendations   Shoulder Instructions      Home Living Family/patient expects to be discharged to:: Skilled nursing facility                                 Additional Comments: pt from [facility], eventual dc to [facility]; information from chart      Prior Functioning/Environment Prior Level of Function : Needs assist             Mobility Comments: amb with RW at SNF per PT notes ADLs Comments: assist for ADL/IADL from SNF staff    OT Problem List: Decreased activity tolerance;Decreased knowledge of use of DME or AE;Decreased strength;Impaired balance (sitting and/or standing);Decreased cognition   OT Treatment/Interventions: Self-care/ADL training;DME and/or AE instruction;Therapeutic activities;Balance training;Therapeutic exercise;Energy conservation;Patient/family education      OT Goals(Current goals can be  found in the care plan section)   Acute Rehab OT Goals Patient Stated Goal: improve function OT Goal Formulation: With patient Time For Goal Achievement: 01/30/24 Potential to Achieve Goals: Good ADL Goals Pt Will Perform Grooming: sitting;with supervision Pt Will Perform Lower Body Dressing: with mod assist;sit to/from stand;sitting/lateral leans Pt Will Transfer to Toilet: with contact guard assist;ambulating Pt Will Perform Toileting - Clothing Manipulation and hygiene: sitting/lateral leans;sit to/from stand;with min assist   OT Frequency:  Min 2X/week    Co-evaluation              AM-PAC OT 6 Clicks Daily Activity     Outcome Measure Help from another person eating meals?: A Little Help from another person taking care of personal grooming?: A Little Help from another person toileting, which includes using toliet, bedpan, or urinal?: A Lot Help from another person bathing (including washing, rinsing, drying)?: A Lot Help from another person to put on and taking off regular upper body clothing?: A Little Help from another person to put on and taking off regular lower body clothing?: A Lot 6 Click Score: 15   End of Session Equipment Utilized During Treatment: Rolling walker (2 wheels) Nurse Communication: Mobility status  Activity Tolerance: Patient tolerated treatment well Patient left: in bed;with call  bell/phone within reach;with bed alarm set;with nursing/sitter in room  OT Visit Diagnosis: Other abnormalities of gait and mobility (R26.89);Muscle weakness (generalized) (M62.81)                Time: 1332-1400 OT Time Calculation (min): 28 min Charges:  OT General Charges $OT Visit: 1 Visit OT Evaluation $OT Eval Moderate Complexity: 1 Mod  Therisa Sheffield, OTD OTR/L  01/16/24, 4:08 PM

## 2024-01-16 NOTE — Progress Notes (Signed)
 Date of Admission:  01/14/2024   T  ID: RHILYNN PREYER is a 89 y.o. female  Principal Problem:   Acute bronchitis Active Problems:   TIA (transient ischemic attack)   Essential hypertension   Sepsis (HCC)   HLD (hyperlipidemia)   Chronic diastolic CHF (congestive heart failure) (HCC)   Hypothyroidism   Myocardial injury   Chronic atrial fibrillation with RVR (HCC)   Bacteremia  89 y.o. female with a history of Afib on eliquis , HTN, HLD, CVA, CHF, left TKA, laminectomy presents from SNF  with cough and shorthess of breath of 3 days duration. She also has decreased appetite and generalized weakness She has fever and chills. I spoke to her son [name] and he shared more information today Pt used to live in [place] . In June 2025 /Juy 2025 she was admitted to [facility] [place] and [facility] due to severe case of crusted scabies , she had 3 strokes and they attributed it to the crusted scabies- Son states that pt had skin infection with MRSA -She was treated for scabies and then came to [facility] for Rehab at the end of July 2025. She has h/o afib and had undergone cardioversion at one time  Subjective: Pt is feeling better Has cough but not bringing up phlegm  Medications:   apixaban   2.5 mg Oral BID   ascorbic acid   500 mg Oral Daily   atorvastatin   20 mg Oral Daily   B-complex with vitamin C   1 tablet Oral Daily   cholecalciferol   1,000 Units Oral Daily   empagliflozin   10 mg Oral Daily   furosemide   40 mg Oral Daily   gabapentin   100 mg Oral TID   ipratropium-albuterol   3 mL Nebulization BID   levothyroxine   137 mcg Oral q AM   loratadine   10 mg Oral Daily   metoprolol  tartrate  50 mg Oral BID   multivitamin with minerals  1 tablet Oral Daily   mupirocin  ointment  1 Application Nasal BID   [START ON 01/17/2024] predniSONE   20 mg Oral Q breakfast   sertraline   25 mg Oral Daily   traZODone   100 mg Oral QHS    triamcinolone  ointment  1 Application Topical QHS    Objective: Vital signs in last 24 hours: Patient Vitals for the past 24 hrs:  BP Temp Temp src Pulse Resp SpO2  01/16/24 1100 112/84 -- -- 87 (!) 22 95 %  01/16/24 0800 126/63 -- -- 81 (!) 21 93 %  01/16/24 0712 119/70 98 F (36.7 C) -- 60 20 91 %  01/16/24 0000 (!) 107/55 98 F (36.7 C) Oral 73 19 92 %  01/15/24 2300 (!) 99/52 -- -- 87 20 93 %  01/15/24 2200 (!) 111/52 -- -- (!) 102 19 94 %  01/15/24 2100 129/78 -- -- (!) 128 20 97 %  01/15/24 2000 124/71 98.1 F (36.7 C) Oral 75 18 98 %  01/15/24 1900 135/80 -- -- (!) 105 18 96 %  01/15/24 1800 106/61 -- -- 98 16 95 %  01/15/24 1730 115/66 -- -- 88 17 95 %  01/15/24 1700 112/75 98.1 F (36.7 C) Oral (!) 102 17 96 %  01/15/24 1600 105/67 -- -- (!) 119 17 97 %  01/15/24 1530 109/70 -- -- (!) 130 18 98 %  01/15/24 1500 123/87 -- -- (!) 150 16 98 %     Lines and Device Date on insertion # of  days DC  Engineer, technical sales     ETT       PHYSICAL EXAM:  General: Alert, cooperative, no distress, appears stated age.  Head: Normocephalic, without obvious abnormality, atraumatic. Eyes: Conjunctivae clear, anicteric sclerae. Pupils are equal ENT Nares normal. No drainage or sinus tenderness. Lips, mucosa, and tongue normal. No Thrush Neck: Supple, symmetrical, no adenopathy, thyroid : non tender no carotid bruit and no JVD. Back: No CVA tenderness. Lungs: b/l air entry Few crepts  Heart:irregular. Abdomen: Soft, non-tender,not distended. Bowel sounds normal. No masses Extremities: atraumatic, no cyanosis. No edema. No clubbing Skin: no lesions Rt cheek verrucous lesion Lymph: Cervical, supraclavicular normal. Neurologic: Grossly non-focal  Lab Results    Latest Ref Rng & Units 01/15/2024    5:28 AM 01/14/2024    3:36 PM 01/31/2018    5:09 AM  CBC  WBC 4.0 - 10.5 K/uL 7.5  9.4  9.9   Hemoglobin 12.0 - 15.0 g/dL 89.0  88.2  89.5   Hematocrit 36.0 -  46.0 % 34.3  36.0  31.5   Platelets 150 - 400 K/uL 385  405  362        Latest Ref Rng & Units 01/16/2024    5:17 AM 01/15/2024    5:28 AM 01/14/2024    3:36 PM  CMP  Glucose 70 - 99 mg/dL 875  835  856   BUN 8 - 23 mg/dL 27  18  14    Creatinine 0.44 - 1.00 mg/dL 9.41  9.35  9.22   Sodium 135 - 145 mmol/L 137  139  135   Potassium 3.5 - 5.1 mmol/L 3.8  4.0  3.6   Chloride 98 - 111 mmol/L 105  104  98   CO2 22 - 32 mmol/L 26  21  27    Calcium  8.9 - 10.3 mg/dL 8.8  8.7  8.5   Total Protein 6.5 - 8.1 g/dL   6.8   Total Bilirubin 0.0 - 1.2 mg/dL   0.6   Alkaline Phos 38 - 126 U/L   137   AST 15 - 41 U/L   32   ALT 0 - 44 U/L   28       Microbiology: 8/24 -  BC both sets 1 bottle each has MRSA 8/24 MRSA nares positive 8/26 BC Sent Studies/Results: ECHOCARDIOGRAM COMPLETE Result Date: 01/15/2024    ECHOCARDIOGRAM REPORT   Patient Name:   JANNELLE NOTARO Date of Exam: 01/15/2024 Medical Rec #:  [id number]    Height:       66.5 in Accession #:    [id number]   Weight:       124.1 lb Date of Birth:  1933/06/05     BSA:          1.642 m Patient Age:    89 years     BP:           113/70 mmHg Patient Gender: F            HR:           88 bpm. Exam Location:  [facility] Procedure: 2D Echo, Cardiac Doppler and Color Doppler (Both Spectral and Color            Flow Doppler were utilized during procedure). Indications:     Bacteremia R78.81  History:         Patient has prior history of Echocardiogram examinations, most  recent 12/30/2014. Signs/Symptoms:Syncope; Risk                  Factors:Hypertension.  Sonographer:     Christopher Furnace Referring Phys:  [id number] DONALD BERLIN Diagnosing Phys: Keller Alluri IMPRESSIONS  1. Left ventricular ejection fraction, by estimation, is 65 to 70%. The left ventricle has normal function. There is mild left ventricular hypertrophy. Left ventricular diastolic parameters are indeterminate.  2. Right ventricular systolic function is normal. The right ventricular  size is normal.  3. Left atrial size was moderately dilated.  4. Mild to moderate mitral valve regurgitation. Moderate mitral annular calcification.  5. Tricuspid valve regurgitation is mild to moderate.  6. The aortic valve is tricuspid. Aortic valve regurgitation is mild. Conclusion(s)/Recommendation(s): No evidence of valvular vegetations on this transthoracic echocardiogram. Consider a transesophageal echocardiogram to exclude infective endocarditis if clinically indicated. FINDINGS  Left Ventricle: Left ventricular ejection fraction, by estimation, is 65 to 70%. The left ventricle has normal function. The left ventricular internal cavity size was normal in size. There is mild left ventricular hypertrophy. Left ventricular diastolic  parameters are indeterminate. Right Ventricle: The right ventricular size is normal. No increase in right ventricular wall thickness. Right ventricular systolic function is normal. Left Atrium: Left atrial size was moderately dilated. Right Atrium: Right atrial size was normal in size. Pericardium: There is no evidence of pericardial effusion. Mitral Valve: There is mild thickening of the mitral valve leaflet(s). Moderate mitral annular calcification. Mild to moderate mitral valve regurgitation. Tricuspid Valve: The tricuspid valve is normal in structure. Tricuspid valve regurgitation is mild to moderate. Aortic Valve: The aortic valve is tricuspid. Aortic valve regurgitation is mild. Aortic valve mean gradient measures 4.0 mmHg. Aortic valve peak gradient measures 8.1 mmHg. Aortic valve area, by VTI measures 3.06 cm. Pulmonic Valve: The pulmonic valve was not well visualized. Pulmonic valve regurgitation is not visualized. Aorta: The aortic root is normal in size and structure. IAS/Shunts: The atrial septum is grossly normal.  LEFT VENTRICLE PLAX 2D LVIDd:         3.25 cm   Diastology LVIDs:         2.25 cm   LV e' lateral:   8.51 cm/s LV PW:         1.21 cm   LV E/e' lateral: 9.5  LV IVS:        1.34 cm LVOT diam:     2.00 cm LV SV:         61 LV SV Index:   37 LVOT Area:     3.14 cm  RIGHT VENTRICLE RV Basal diam:  3.24 cm RV Mid diam:    2.79 cm LEFT ATRIUM             Index        RIGHT ATRIUM           Index LA diam:        4.20 cm 2.56 cm/m   RA Area:     14.80 cm LA Vol (A2C):   76.8 ml 46.77 ml/m  RA Volume:   36.90 ml  22.47 ml/m LA Vol (A4C):   64.7 ml 39.40 ml/m LA Biplane Vol: 70.2 ml 42.75 ml/m  AORTIC VALVE AV Area (Vmax):    2.63 cm AV Area (Vmean):   2.61 cm AV Area (VTI):     3.06 cm AV Vmax:           142.00 cm/s AV Vmean:  94.000 cm/s AV VTI:            0.198 m AV Peak Grad:      8.1 mmHg AV Mean Grad:      4.0 mmHg LVOT Vmax:         119.00 cm/s LVOT Vmean:        78.100 cm/s LVOT VTI:          0.193 m LVOT/AV VTI ratio: 0.97  AORTA Ao Root diam: 2.90 cm MITRAL VALVE                TRICUSPID VALVE MV Area (PHT): 8.52 cm     TR Peak grad:   44.9 mmHg MV Decel Time: 89 msec      TR Vmax:        335.00 cm/s MV E velocity: 80.50 cm/s MV A velocity: 135.00 cm/s  SHUNTS MV E/A ratio:  0.60         Systemic VTI:  0.19 m                             Systemic Diam: 2.00 cm Keller Paterson Electronically signed by Keller Paterson Signature Date/Time: 01/15/2024/6:14:16 PM    Final    DG Chest Port 1 View Result Date: 01/14/2024 CLINICAL DATA:  Question of sepsis to evaluate for abnormality. EXAM: PORTABLE CHEST 1 VIEW COMPARISON:  None Available. FINDINGS: Borderline heart size with normal pulmonary vascularity. Calcification in the mitral valve annulus. Calcification of the aorta. Peribronchial thickening with hazy perihilar infiltrates. This could indicate edema or bronchitis. No focal consolidation. No pleural effusion or pneumothorax. Mediastinal contours appear intact. Calcification of the aorta. Degenerative changes in the spine and shoulders. Surgical clips in the right breast. IMPRESSION: Borderline heart size. Peribronchial thickening with hazy perihilar  infiltrates may indicate edema or bronchitis. No focal consolidation. Electronically Signed   By: Elsie Gravely M.D.   On: 01/14/2024 16:43     Assessment/Plan: MRSA bacteremia She is colonized with it 2 d echo no obvious vegetation Repeat blood culture sent Getting TEE tomorrow Pt is on Vancomycin  IV Spoke to lab to get Daptomycin  MIC  Meta pneumovirus  bronchitis and lower resp infection   AFIB with RVR on metoprolol  and eliquis   Anemia  H/o CHF  H/o crusted scabies treated in [place]  H/o CVA- some residual left sided weakness  Vascular dementia  B/l TKA Hypothyroidism- on synthroid   This evaluation involved complex antimicrobial management Discussed with her son in great detail Care coordinated with cardiology team and hospitalist Spoke to [facility] to fax medical records ( discharge summary)  from [facility]

## 2024-01-16 NOTE — Progress Notes (Signed)
   [facility] has been requested to perform a transesophageal echocardiogram on Diane Chambers for bacteremia.    The patient does NOT have any absolute or relative contraindications to a Transesophageal Echocardiogram (TEE).  The patient has: No other conditions that may impact this procedure.   After careful review of history and examination, the risks and benefits of transesophageal echocardiogram have been explained including risks of esophageal damage, perforation (1:10,000 risk), bleeding, pharyngeal hematoma as well as other potential complications associated with conscious sedation including aspiration, arrhythmia, respiratory failure and death. Alternatives to treatment were discussed, questions were answered. Patient is willing to proceed. Also discussed via phone with her son who is the power of attorney and he is willing to provide consent on her behalf.   Signed, Lesley LITTIE Maffucci, PA-C  01/16/2024 11:40 AM

## 2024-01-16 NOTE — Care Management Important Message (Signed)
 Important Message  Patient Details  Name: VILLA BURGIN MRN: [id number] Date of Birth: [birth year]   Important Message Given:  Yes - Medicare IM     Rojelio SHAUNNA Rattler 01/16/2024, 1:41 PM

## 2024-01-16 NOTE — Progress Notes (Incomplete)
 Patient mentation has improved through day.  Was

## 2024-01-16 NOTE — Progress Notes (Addendum)
 Progress Note   Patient: Diane Chambers FMW:[id number] DOB: 04-Apr-1934 DOA: 01/14/2024     2 DOS: the patient was seen and examined on 01/16/2024   Brief hospital course:  HPI on admission: Diane Chambers is a 89 y.o. female with medical history significant of A fib on Eliquis , dCHF, HTN, HLD, multiple stroke with left arm weakness, vascular dementia, hypothyroidism, colitis, carotid artery stenosis, who presents with SOB.   Per patient and her son at the bedside, patient has generalized weakness and decreased appetite and oral intake for more than 3 days.  Since this morning, patient developed SOB, which has been progressively worsening.  Patient has dry cough, no chest pain. Patient has fever and chills.  Her temperature is 100.7 in ED.  Per report, patient has oxygen desaturation to 87% in facility.  He is oxygen saturation is 94% in the ED.  Her son states that patient's heart rate was up to 175 earlier.  Her heart rate is 140-160 in ED.... See H&P for full HPI on admission & ED course.  Patient was admitted for further evaluation and management of acute bronchitis complicated by A-fib with RVR requiring Cardizem  infusion for heart rate control.  Pt was started on Rocephin  and Zithromax .  On next hospital day, blood cultures positive for staph, as well as MRSA positive nares PCR screen.  Vancomycin  was added and Infectious disease consulted.  Further hospital course and management as outlined below.   Assessment and Plan:  Severe Sepsis due to Staph bacteremia, acute bronchitis, metapneumovirus infection Pt met criteria for severe sepsis on admission with fever 100.7 and tachycardia with A-fib with RVR.  Lactic acid elevated 2.6-->  1.6.  +Blood cultures --Continue Rocephin , Zithromax  --Vancomycin  added 8/25 --ID is following --Cardiology consulted for TEE, planned for 1230 tomorrow --Follow sputum cultures - RT to induce sputum if needed --Follow blood cultures & obtain repeats --IS,  flutter for pulmonary hygiene --Mucolytics PRN --Prednisone  --Duonebs  Chronic atrial fibrillation with RVR  --Continue Eliquis  --Increased home metoprolol  25 mg daily to 50 mg BID (Unclear reason for once daily dosing for metoprolol  tartrate as is home dose) --Will attempt to wean off Cardizem  drip today, HR's improved to 60-80's --Telemetry --Maintain K>4, Mg>2 --Mgmt of acute illness above, precipitating RVR    Chronic diastolic CHF (congestive heart failure) (HCC): 2D echo on 12/30/2014 showed EF of 55 to 60% with grade 1 diastolic dysfunction.  Patient has had elevated BNP of 293.  Received 1.5 L normal saline in ED due to lactic acidosis.    Echo this admission -- EF 65-70%, indeterminate diastolic paramters, mild-mod MR, mild-mod TR, mild AR  --Given 40 mg of Lasix  IV x 1 on admission --Continue oral Lasix  to 40 mg daily --Monitor volume status, renal function & electrolytes  Myocardial injury: Troponin 35 --> 34, likely demand ischemia due to Afib RVR and sepsis --deferred aspirin  since patient is on Eliquis  --Lipitor   Hypothyroidism: TSH and T4 normal -Continue Synthroid  175 mcg daily   TIA (transient ischemic attack) -On Eliquis  and Lipitor   Essential hypertension - IV hydralazine as needed - Metoprolol    HLD (hyperlipidemia) - Lipitor       Subjective: Pt up in recliner working with PT when seen today. She reports ongoing cough. Not able to cough up phlegm.  No fever/chills.  Otherwise feeling little better since admission.  Physical Exam: Vitals:   01/16/24 0712 01/16/24 0800 01/16/24 1100 01/16/24 1500  BP: 119/70 126/63 112/84 111/75  Pulse:  60 81 87 72  Resp: 20 (!) 21 (!) 22 17  Temp: 98 F (36.7 C)   98 F (36.7 C)  TempSrc:    Oral  SpO2: 91% 93% 95% 97%  Weight:      Height:       General exam: awake, alert, no acute distress HEENT: moist mucus membranes, hearing grossly normal  Respiratory system: CTAB, rhonchi vs upper airway secretion  sounds, normal respiratory effort at rest on room air. Cardiovascular system: normal S1/S2, RRR, no pedal edema.   Gastrointestinal system: soft, NT, ND, +bowel sounds. Central nervous system: A&O x2+. no gross focal neurologic deficits, normal speech Skin: dry, intact, normal temperature Psychiatry: normal mood, congruent affect, judgement and insight appear normal   Data Reviewed:  Notable labs --  Glucose 124 BUN 27 Ca 8.8  Respiratory viral panel +metapneumovirus  Last Hbg stable 10.9 Last WBC normal 7.5 Procal < 0.10  MRSA PCR positive  Blood culture -- growing staph species - pending  Echo -- EF 65-70%, indeterminate diastolic paramters, mild-mod MR, mild-mod TR, mild AR   Family Communication: attempted to call son this afternoon, left voicemail but without specific details as it was mailbox without his identification. Will attempt again this evening.   Disposition: Status is: Inpatient Remains inpatient appropriate because: remains on IV therapies as above, ongoing evaluation for bacteremia with TEE planned.    Planned Discharge Destination: Home    Time spent: 45 minutes  Author: Burnard DELENA Cunning, DO 01/16/2024 4:36 PM  For on call review [email or web address].

## 2024-01-16 NOTE — Progress Notes (Signed)
 Heart Failure Nurse Navigator Progress Note  PCP: Pcp, No PCP-Cardiologist: [facility] Admission Diagnosis: Atrial fibrillation with RVR (HCC) Community acquired pneumonia, unspecified laterality Sepsis, due to unspecified organism, unspecified whether acute organ dysfunction present [facility]) Admitted from: [facility] via [facility]  Presentation:   Diane Chambers presented with new onset Atrial Fibrillation with RVR-Heart Rate 160.  Shortness of breath for the past 5 days with non productive cough. Patient feels fatigued. No complaints of pain but does have a productive cough. BNP 293.3. HS-Troponin 34. Chest x-ray: Borderline heart size. Peribronchial thickening with hazy perihilar infiltrates may indicate edema or bronchitis.   ECHO/ LVEF: 65-70%. Mild left ventricular hypertrophy. Left ventricular diastolic parameters are indeterminate.  Left atrial size was moderately dilated.  Mild to moderate mitral valve regurgitation. Tricuspid valve regurgitation is mild to moderate.  Aortic valve regurgitation is mild.   TEE scheduled for 01/16/24  Clinical Course:  Past Medical History:  Diagnosis Date   Age-related macular degeneration, wet, right eye (HCC)    Carotid artery stenosis, asymptomatic, bilateral    last duplex in epic 02-18-2015  bilateral ICA <50%   Chronically dry eyes    Fracture 01/24/2018   right foot-neck of fifth metatarsal    GERD (gastroesophageal reflux disease)    Hiatal hernia    History of colitis 12/16/2016   secondary to champylobacter   History of recurrent TIAs    total x4  last one 12-29-2014---  (01-23-2018 per pt no residual's)   History of syncope    11/ 2018 approx. , cardiac work-up done by dr ladona    Hypertension    Hypothyroidism    followed by pcp   Lower urinary tract symptoms (LUTS)    OA (osteoarthritis)    knees   RLS (restless legs syndrome)    Venous stasis    01-23-2018 per pt elevates feet when sitting , if not toes become numb   Wears  glasses    Wears hearing aid in right ear      Social History   Socioeconomic History   Marital status: Widowed    Spouse name: Not on file   Number of children: Not on file   Years of education: Not on file   Highest education level: Not on file  Occupational History   Not on file  Tobacco Use   Smoking status: Former    Current packs/day: 0.00    Types: Cigarettes    Start date: 01/23/1990    Quit date: 01/24/2016    Years since quitting: 7.9   Smokeless tobacco: Never  Vaping Use   Vaping status: Never Used  Substance and Sexual Activity   Alcohol use: Yes    Alcohol/week: 12.0 - 14.0 standard drinks of alcohol    Types: 12 - 14 Standard drinks or equivalent per week    Comment: 1-2 drink per night prior to SNF   Drug use: No   Sexual activity: Not on file  Other Topics Concern   Not on file  Social History Narrative   Not on file   Social Drivers of Health   Financial Resource Strain: Not on file  Food Insecurity: No Food Insecurity (01/16/2024)   Hunger Vital Sign    Worried About Running Out of Food in the Last Year: Never true    Ran Out of Food in the Last Year: Never true  Transportation Needs: No Transportation Needs (01/16/2024)   PRAPARE - Administrator, Civil Service (Medical): No  Lack of Transportation (Non-Medical): No  Physical Activity: Not on file  Stress: Not on file  Social Connections: Not on file   Education Assessment and Provision:  Detailed education and instructions provided on heart failure disease management including the following:  Signs and symptoms of Heart Failure When to call the physician Importance of daily weights Low sodium diet Fluid restriction Medication management Anticipated future follow-up appointments  Patient education given on each of the above topics.  Patient acknowledges understanding via teach back method and acceptance of all instructions.  Education Materials:  Living Better With Heart  Failure Booklet, HF zone tool, & Daily Weight Tracker Tool.  Patient has scale at home:  [facility] Patient has pill box at home: Facility provides medications to patient.    High Risk Criteria for Readmission and/or Poor Patient Outcomes: Heart failure hospital admissions (last 6 months): 1  No Show rate: 0% Difficult social situation: Dementia, Right Hearing Aid, & Glasses Demonstrates medication adherence: Yes Primary Language: English Literacy level: Reading, Writing & Comprehension  Barriers of Care:   Daily Weights @ Facility  Considerations/Referrals:   Referral made to Heart Failure Pharmacist Stewardship: No Referral made to Heart Failure CSW/NCM TOC: No Referral made to Heart & Vascular TOC clinic: Yes. Hospital follow-up 01/24/2024 @ 11:00 AHF Clinic @ [facility].  Items for Follow-up on DC/TOC: Daily Weights Diet & Fluid Restrictions Continued Heart Failure Education  Charmaine Pines, RN, BSN [facility] Heart Failure Navigator Secure Chat Only

## 2024-01-17 ENCOUNTER — Inpatient Hospital Stay: Admit: 2024-01-17

## 2024-01-17 ENCOUNTER — Inpatient Hospital Stay (HOSPITAL_COMMUNITY)
Admit: 2024-01-17 | Discharge: 2024-01-17 | Disposition: A | Discharge status: Home / Self Care | Attending: Physician Assistant | Admitting: Physician Assistant

## 2024-01-17 ENCOUNTER — Encounter
Admission: EM | Disposition: A | Discharge status: Skilled Nursing Facility | Payer: Self-pay | Source: Skilled Nursing Facility | Attending: Internal Medicine

## 2024-01-17 DIAGNOSIS — I340 Nonrheumatic mitral (valve) insufficiency: Secondary | ICD-10-CM | POA: Diagnosis not present

## 2024-01-17 DIAGNOSIS — I38 Endocarditis, valve unspecified: Secondary | ICD-10-CM

## 2024-01-17 DIAGNOSIS — R7881 Bacteremia: Secondary | ICD-10-CM | POA: Diagnosis not present

## 2024-01-17 DIAGNOSIS — I361 Nonrheumatic tricuspid (valve) insufficiency: Secondary | ICD-10-CM | POA: Diagnosis not present

## 2024-01-17 HISTORY — PX: TEE WITHOUT CARDIOVERSION: SHX5443

## 2024-01-17 LAB — CBC
HCT: 33.1 % — ABNORMAL LOW (ref 36.0–46.0)
Hemoglobin: 10.5 g/dL — ABNORMAL LOW (ref 12.0–15.0)
MCH: 30 pg (ref 26.0–34.0)
MCHC: 31.7 g/dL (ref 30.0–36.0)
MCV: 94.6 fL (ref 80.0–100.0)
Platelets: 419 K/uL — ABNORMAL HIGH (ref 150–400)
RBC: 3.5 MIL/uL — ABNORMAL LOW (ref 3.87–5.11)
RDW: 15.6 % — ABNORMAL HIGH (ref 11.5–15.5)
WBC: 7.4 K/uL (ref 4.0–10.5)
nRBC: 0 % (ref 0.0–0.2)

## 2024-01-17 LAB — URINALYSIS, W/ REFLEX TO CULTURE (INFECTION SUSPECTED)
Bilirubin Urine: NEGATIVE
Glucose, UA: >=500 mg/dL — AB
Hgb urine dipstick: NEGATIVE
Ketones, ur: NEGATIVE mg/dL
Nitrite: NEGATIVE
Protein, ur: NEGATIVE mg/dL
RBC / HPF: >50 RBC/hpf (ref 0–5)
Specific Gravity, Urine: 1.013 (ref 1.005–1.030)
WBC, UA: >50 WBC/hpf (ref 0–5)
pH: 6 (ref 5.0–8.0)

## 2024-01-17 LAB — BASIC METABOLIC PANEL WITH GFR
Anion gap: 5 (ref 5–15)
BUN: 20 mg/dL (ref 8–23)
CO2: 27 mmol/L (ref 22–32)
Calcium: 8.8 mg/dL — ABNORMAL LOW (ref 8.9–10.3)
Chloride: 107 mmol/L (ref 98–111)
Creatinine, Ser: 0.53 mg/dL (ref 0.44–1.00)
GFR, Estimated: >60 mL/min (ref >60)
Glucose, Bld: 116 mg/dL — ABNORMAL HIGH (ref 70–99)
Potassium: 3.7 mmol/L (ref 3.5–5.1)
Sodium: 139 mmol/L (ref 135–145)

## 2024-01-17 LAB — CULTURE, BLOOD (ROUTINE X 2)

## 2024-01-17 LAB — ECHO TEE

## 2024-01-17 SURGERY — ECHOCARDIOGRAM, TRANSESOPHAGEAL
Anesthesia: Moderate Sedation

## 2024-01-17 MED ORDER — MIDAZOLAM HCL 2 MG/2ML IJ SOLN
INTRAMUSCULAR | Status: DC | PRN
  Administered 2024-01-17: .5 mg
  Administered 2024-01-17: .5 mg via INTRAVENOUS
  Administered 2024-01-17: .5 mg

## 2024-01-17 MED ORDER — ALBUTEROL SULFATE (2.5 MG/3ML) 0.083% IN NEBU
INHALATION_SOLUTION | RESPIRATORY_TRACT | Status: AC
  Filled 2024-01-17: qty 3

## 2024-01-17 MED ORDER — LIDOCAINE VISCOUS HCL 2 % MT SOLN
OROMUCOSAL | Status: AC
  Filled 2024-01-17: qty 15

## 2024-01-17 MED ORDER — FENTANYL CITRATE PF 50 MCG/ML IJ SOSY
PREFILLED_SYRINGE | INTRAMUSCULAR | Status: DC | PRN
  Administered 2024-01-17: 25 ug via INTRAVENOUS
  Administered 2024-01-17 (×3): 25 ug

## 2024-01-17 MED ORDER — BUTAMBEN-TETRACAINE-BENZOCAINE 2-2-14 % EX AERO
INHALATION_SPRAY | CUTANEOUS | Status: AC
  Filled 2024-01-17: qty 5

## 2024-01-17 MED ORDER — FENTANYL CITRATE (PF) 100 MCG/2ML IJ SOLN
INTRAMUSCULAR | Status: AC
Start: 2024-01-17 — End: 2024-01-17
  Filled 2024-01-17: qty 4

## 2024-01-17 MED ORDER — MIDAZOLAM HCL 2 MG/2ML IJ SOLN
INTRAMUSCULAR | Status: AC
  Filled 2024-01-17: qty 4

## 2024-01-17 NOTE — Progress Notes (Signed)
 Transesophageal Echocardiogram :  Indication: MRSA bacteremia, rule out endocarditis Requesting/ordering  physician: Dr. Dorinda  Procedure: Benzocaine  spray x2 and 2 mls x 2 of viscous lidocaine  were given orally to provide local anesthesia to the oropharynx. The patient was positioned supine on the left side, bite block provided. The patient was moderately sedated with the doses of versed  and fentanyl  as detailed below.  Using digital technique an omniplane probe was advanced into the distal esophagus without incident.   Moderate sedation: 1. Sedation used:  Versed : 1.5 mg IV, Fentanyl : 75 mcg IV 2. Time administered: 12:30 PM    time when patient started recovery: 1 PM Total sedation time 30 minutes 3. I was face to face during this time  See report in EPIC  for complete details: In brief, no valve vegetation noted 1. Left ventricular ejection fraction, by estimation, is 55 to 60%. The left ventricle has normal function. The left ventricle has no regional wall motion abnormalities. Left ventricular diastolic parameters are indeterminate.   2. Right ventricular systolic function is normal. The right ventricular size is normal.   3. No left atrial/left atrial appendage thrombus was detected.   4. The mitral valve is normal in structure. Mild to moderate mitral valve regurgitation. No evidence of mitral stenosis.   5. The aortic valve is normal in structure. Aortic valve regurgitation is trivial. No aortic stenosis is present.   6. The inferior vena cava is normal in size with greater than 50% respiratory variability, suggesting right atrial pressure of 3 mmHg.   7. Agitated saline contrast bubble study was negative, with no evidence of any interatrial shunt.   Diane Chambers 01/17/2024 2:10 PM '

## 2024-01-17 NOTE — Progress Notes (Signed)
 Patient stable.  Bedside handoff given to Amy RN

## 2024-01-17 NOTE — Progress Notes (Signed)
 Occupational Therapy Treatment Patient Details Name: Diane Chambers MRN: [id number] DOB: 1933-12-04 Today's Date: 01/17/2024   History of present illness Pt is an 89 y.o. female with medical history significant of A fib on Eliquis , dCHF, HTN, HLD, multiple stroke with left arm weakness, vascular dementia, hypothyroidism, colitis, carotid artery stenosis, who presents with SOB. MD assessment includes: severe sepsis due to acute bronchitis and possible staph bacteremia, chronic atrial fibrillation with RVR, and myocardial injury with troponin 35, likely demand ischemia.   OT comments  Chart reviewed to date, pt greeted in bed, oriented to self and place, agreeable to OT tx session targeting improving functional activity tolerance in prep for ADL tasks. Pt is making progress towards goals as evidenced by performing bed mobility with CGA, STS with MIN A with RW, amb in room with CGA-MIN A. Grooming tasks completed with supervision-MIN A for package/container management. MAX A required for LB dressing. Noted weakness in LUE with grooming/FMC tasks. Pt is making progress towards goals, discharge recommendation remains appropriate. OT will continue to follow.       If plan is discharge home, recommend the following:  A lot of help with walking and/or transfers;A lot of help with bathing/dressing/bathroom;Supervision due to cognitive status   Equipment Recommendations  Other (comment) (defer to next venue)    Recommendations for Other Services      Precautions / Restrictions Precautions Precautions: Fall Recall of Precautions/Restrictions: Impaired Restrictions Weight Bearing Restrictions Per Provider Order: No       Mobility Bed Mobility Overal bed mobility: Needs Assistance Bed Mobility: Supine to Sit     Supine to sit: HOB elevated, Used rails, Contact guard          Transfers Overall transfer level: Needs assistance Equipment used: Rolling walker (2 wheels) Transfers: Sit to/from  Stand Sit to Stand: Min assist                 Balance Overall balance assessment: Needs assistance Sitting-balance support: Feet supported Sitting balance-Leahy Scale: Good     Standing balance support: Bilateral upper extremity supported, During functional activity, Reliant on assistive device for balance Standing balance-Leahy Scale: Fair                             ADL either performed or assessed with clinical judgement   ADL Overall ADL's : Needs assistance/impaired     Grooming: Wash/dry face;Oral care;Brushing hair;Sitting;Supervision/safety-MIN A           Upper Body Dressing : Moderate assistance;Sitting   Lower Body Dressing: Maximal assistance;Sitting/lateral leans   Toilet Transfer: Contact guard assist;Minimal assistance;Ambulation;Rolling walker (2 wheels) Toilet Transfer Details (indicate cue type and reason): intermittent vcs for technique Toileting- Clothing Manipulation and Hygiene: Maximal assistance;Sit to/from stand       Functional mobility during ADLs: Contact guard assist;Minimal assistance;Rolling walker (2 wheels) (approx 15' two attempts)      Extremity/Trunk Assessment              Vision       Perception     Praxis     Communication Communication Communication: Impaired Factors Affecting Communication: Hearing impaired   Cognition Arousal: Alert Behavior During Therapy: WFL for tasks assessed/performed Cognition: History of cognitive impairments, No family/caregiver present to determine baseline, Cognition impaired   Orientation impairments: Time   Memory impairment (select all impairments): Declarative long-term memory Attention impairment (select first level of impairment): Selective attention  Following commands: Impaired Following commands impaired: Follows one step commands with increased time      Cueing   Cueing Techniques: Verbal cues, Gestural cues  Exercises  Other Exercises Other Exercises: edu re role of OT, role of rehab, discharge recommendations    Shoulder Instructions       General Comments vss throughout    Pertinent Vitals/ Pain       Pain Assessment Pain Assessment: No/denies pain  Home Living                                          Prior Functioning/Environment              Frequency  Min 2X/week        Progress Toward Goals  OT Goals(current goals can now be found in the care plan section)  Progress towards OT goals: Progressing toward goals  Acute Rehab OT Goals Time For Goal Achievement: 01/30/24  Plan      Co-evaluation    PT/OT/SLP Co-Evaluation/Treatment: Yes Reason for Co-Treatment: For patient/therapist safety;To address functional/ADL transfers (progress mobility) PT goals addressed during session: Mobility/safety with mobility;Balance;Proper use of DME OT goals addressed during session: ADL's and self-care      AM-PAC OT 6 Clicks Daily Activity     Outcome Measure   Help from another person eating meals?: A Little Help from another person taking care of personal grooming?: A Little Help from another person toileting, which includes using toliet, bedpan, or urinal?: A Lot Help from another person bathing (including washing, rinsing, drying)?: A Lot Help from another person to put on and taking off regular upper body clothing?: A Little Help from another person to put on and taking off regular lower body clothing?: A Lot 6 Click Score: 15    End of Session Equipment Utilized During Treatment: Rolling walker (2 wheels);Gait belt  OT Visit Diagnosis: Other abnormalities of gait and mobility (R26.89);Muscle weakness (generalized) (M62.81)   Activity Tolerance Patient tolerated treatment well   Patient Left in chair;with call bell/phone within reach;with chair alarm set   Nurse Communication Mobility status        Time: 9082-9052 OT Time Calculation (min): 30  min  Charges: OT General Charges $OT Visit: 1 Visit OT Treatments $Self Care/Home Management : 8-22 mins  Therisa Sheffield, OTD OTR/L  01/17/24, 12:29 PM

## 2024-01-17 NOTE — OR Nursing (Signed)
 1430:  Patient returned from TEE to Rm: 252.

## 2024-01-17 NOTE — Progress Notes (Signed)
 Progress Note   Patient: Diane Chambers FMW:[id number] DOB: 1933/11/25 DOA: 01/14/2024     3 DOS: the patient was seen and examined on 01/17/2024    HPI on admission: Diane Chambers is a 89 y.o. female with medical history significant of A fib on Eliquis , dCHF, HTN, HLD, multiple stroke with left arm weakness, vascular dementia, hypothyroidism, colitis, carotid artery stenosis, who presents with SOB.   Per patient and her son at the bedside, patient has generalized weakness and decreased appetite and oral intake for more than 3 days.  Since this morning, patient developed SOB, which has been progressively worsening.  Patient has dry cough, no chest pain. Patient has fever and chills.  Her temperature is 100.7 in ED.  Per report, patient has oxygen desaturation to 87% in facility.  He is oxygen saturation is 94% in the ED.  Her son states that patient's heart rate was up to 175 earlier.  Her heart rate is 140-160 in ED.... See H&P for full HPI on admission & ED course.   Patient was admitted for further evaluation and management of acute bronchitis complicated by A-fib with RVR requiring Cardizem  infusion for heart rate control.  Pt was started on Rocephin  and Zithromax .   On next hospital day, blood cultures positive for staph, as well as MRSA positive nares PCR screen.  Vancomycin  was added and Infectious disease consulted.   Further hospital course and management as outlined below.     Assessment and Plan:   Severe Sepsis due to Staph bacteremia, acute bronchitis, metapneumovirus infection Pt met criteria for severe sepsis on admission with fever 100.7 and tachycardia with A-fib with RVR.  Lactic acid elevated 2.6-->  1.6.  +Blood cultures --Continue Rocephin , Zithromax  --Vancomycin  added 8/25 -Factious disease on board -- Cardiologist has planned TEE today --Follow sputum cultures - RT to induce sputum if needed --Follow blood cultures & obtain repeats --IS, flutter for pulmonary  hygiene --Mucolytics PRN --Prednisone  --Duonebs   Chronic atrial fibrillation with RVR  --Continue Eliquis  --Increased home metoprolol  25 mg daily to 50 mg BID (Unclear reason for once daily dosing for metoprolol  tartrate as is home dose) --Will attempt to wean off Cardizem  drip today, HR's improved to 60-80's --Telemetry --Maintain K>4, Mg>2 --Mgmt of acute illness above, precipitating RVR    Chronic diastolic CHF (congestive heart failure) (HCC): 2D echo on 12/30/2014 showed EF of 55 to 60% with grade 1 diastolic dysfunction.  Patient has had elevated BNP of 293.   Echo this admission -- EF 65-70%, indeterminate diastolic paramters, mild-mod MR, mild-mod TR, mild AR Continue Lasix  --Monitor volume status, renal function & electrolytes   Myocardial injury: Troponin 35 --> 34, likely demand ischemia due to Afib RVR and sepsis --deferred aspirin  since patient is on Eliquis  Continue Lipitor   Hypothyroidism: TSH and T4 normal -Continue Synthroid  175 mcg daily   TIA (transient ischemic attack) -On Eliquis  and Lipitor   Essential hypertension - IV hydralazine as needed - Metoprolol    HLD (hyperlipidemia) - Lipitor    Subjective:   Denies nausea vomiting abdominal pain cough Pain plan for TEE today   Physical Exam:  General exam: awake, alert, no acute distress HEENT: moist mucus membranes, hearing grossly normal  Respiratory system: CTAB, rhonchi vs upper airway secretion sounds, normal respiratory effort at rest on room air. Cardiovascular system: normal S1/S2, RRR, no pedal edema.   Gastrointestinal system: soft, NT, ND, +bowel sounds. Central nervous system: A&O x2+. no gross focal neurologic deficits, normal speech  Skin: dry, intact, normal temperature Psychiatry: normal mood, congruent affect, judgement and insight appear normal          Family Communication: Discussed with patient's son at bedside Disposition: Status is: Inpatient Remains inpatient  appropriate because: remains on IV therapies as above, ongoing evaluation for bacteremia with TEE planned.      Planned Discharge Destination: Home   Data Reviewed:    Latest Ref Rng & Units 01/17/2024    6:29 AM 01/15/2024    5:28 AM 01/14/2024    3:36 PM  CBC  WBC 4.0 - 10.5 K/uL 7.4  7.5  9.4   Hemoglobin 12.0 - 15.0 g/dL 89.4  89.0  88.2   Hematocrit 36.0 - 46.0 % 33.1  34.3  36.0   Platelets 150 - 400 K/uL 419  385  405        Latest Ref Rng & Units 01/17/2024    6:29 AM 01/16/2024    5:17 AM 01/15/2024    5:28 AM  BMP  Glucose 70 - 99 mg/dL 883  875  835   BUN 8 - 23 mg/dL 20  27  18    Creatinine 0.44 - 1.00 mg/dL 9.46  9.41  9.35   Sodium 135 - 145 mmol/L 139  137  139   Potassium 3.5 - 5.1 mmol/L 3.7  3.8  4.0   Chloride 98 - 111 mmol/L 107  105  104   CO2 22 - 32 mmol/L 27  26  21    Calcium  8.9 - 10.3 mg/dL 8.8  8.8  8.7     Vitals:   01/17/24 1300 01/17/24 1315 01/17/24 1330 01/17/24 1423  BP: (!) 117/52 105/65 (!) 113/57 129/81  Pulse: 90 84 78 84  Resp: 14 14 13 18   Temp:    (!) 97.5 F (36.4 C)  TempSrc:    Oral  SpO2: 100% 97% 95% 94%  Weight:      Height:         Author: Drue ONEIDA Potter, MD 01/17/2024 5:28 PM  For on call review [email or web address].

## 2024-01-17 NOTE — Plan of Care (Signed)
   Problem: Education: Goal: Knowledge of General Education information will improve Description: Including pain rating scale, medication(s)/side effects and non-pharmacologic comfort measures Outcome: Progressing   Problem: Clinical Measurements: Goal: Respiratory complications will improve Outcome: Progressing

## 2024-01-17 NOTE — OR Nursing (Signed)
 1145: Patient left Rm: 252 with hospital transporter via stretcher for TEE procedure today.

## 2024-01-17 NOTE — Progress Notes (Signed)
 Date of Admission:  01/14/2024   T  ID: Diane Chambers is a 89 y.o. female  Principal Problem:   Acute bronchitis Active Problems:   TIA (transient ischemic attack)   Essential hypertension   Sepsis (HCC)   HLD (hyperlipidemia)   Chronic diastolic CHF (congestive heart failure) (HCC)   Hypothyroidism   Myocardial injury   Chronic atrial fibrillation with RVR (HCC)   Bacteremia   Atrial fibrillation with RVR (HCC)   Pneumonia due to human metapneumovirus   MRSA bacteremia   Endocarditis  89 y.o. female with a history of Afib on eliquis , HTN, HLD, CVA, CHF, left TKA, laminectomy presents from SNF  with cough and shorthess of breath of 3 days duration. She also has decreased appetite and generalized weakness She has fever and chills. I spoke to her son [name] and then asked [facility] to fax her discharge summary and I reviewed that  Pt used to live in [place] . In June 2025 /Juy 2025 she was admitted to [facility] and [facility] due to severe case of crusted scabies with hypereosinophilia  , she had multiple  strokes She was treated for scabies and then came to [facility] for [facility]hab at the end of July 2025. She has h/o afib and had undergone cardioversion at one time  Subjective: Pt is feeling better Going for TEE  Medications:   apixaban   2.5 mg Oral BID   ascorbic acid   500 mg Oral Daily   atorvastatin   20 mg Oral Daily   B-complex with vitamin C   1 tablet Oral Daily   cholecalciferol   1,000 Units Oral Daily   empagliflozin   10 mg Oral Daily   furosemide   40 mg Oral Daily   gabapentin   100 mg Oral TID   ipratropium-albuterol   3 mL Nebulization BID   levothyroxine   137 mcg Oral q AM   loratadine   10 mg Oral Daily   metoprolol  tartrate  50 mg Oral BID   multivitamin with minerals  1 tablet Oral Daily   mupirocin  ointment  1 Application Nasal BID   sertraline   25 mg Oral Daily   traZODone   100 mg Oral QHS    triamcinolone  ointment  1 Application Topical QHS    Objective: Vital signs in last 24 hours: Patient Vitals for the past 24 hrs:  BP Temp Temp src Pulse Resp SpO2 Weight  01/17/24 1423 129/81 (!) 97.5 F (36.4 C) Oral 84 18 94 % --  01/17/24 1330 (!) 113/57 -- -- 78 13 95 % --  01/17/24 1315 105/65 -- -- 84 14 97 % --  01/17/24 1300 (!) 117/52 -- -- 90 14 100 % --  01/17/24 1209 (!) 146/72 97.8 F (36.6 C) Temporal 86 16 97 % --  01/17/24 0825 -- (!) 97.5 F (36.4 C) Oral -- -- -- --  01/17/24 0416 (!) 140/75 98.2 F (36.8 C) Oral -- -- -- 50.5 kg  01/17/24 0200 125/69 -- -- 79 16 95 % --  01/17/24 0033 (!) 140/73 (!) 97.5 F (36.4 C) Oral 74 15 97 % --  01/16/24 2100 124/73 -- -- 97 20 97 % --  01/16/24 2005 -- -- -- 100 18 97 % --  01/16/24 2000 114/62 97.8 F (36.6 C) Axillary 70 19 98 % --  01/16/24 1900 113/82 -- -- 73 20 97 % --     Lines and Device Date on insertion # of days DC  [employer]  Port/Pacemaker     Foley     ETT       PHYSICAL EXAM:  General: Alert, cooperative, no distress, appears stated age.  Head: Normocephalic, without obvious abnormality, atraumatic. Eyes: Conjunctivae clear, anicteric sclerae. Pupils are equal ENT Nares normal. No drainage or sinus tenderness. Lips, mucosa, and tongue normal. No Thrush Neck: Supple, symmetrical, no adenopathy, thyroid : non tender no carotid bruit and no JVD. Back: No CVA tenderness. Lungs: b/l air entry Few crepts  Heart:irregular. Abdomen: Soft, non-tender,not distended. Bowel sounds normal. No masses Extremities: atraumatic, no cyanosis. No edema. No clubbing Skin: no lesions Rt cheek verrucous lesion Lymph: Cervical, supraclavicular normal. Neurologic: Grossly non-focal  Lab Results    Latest Ref Rng & Units 01/17/2024    6:29 AM 01/15/2024    5:28 AM 01/14/2024    3:36 PM  CBC  WBC 4.0 - 10.5 K/uL 7.4  7.5  9.4   Hemoglobin 12.0 - 15.0 g/dL 89.4  89.0  88.2   Hematocrit 36.0 - 46.0 % 33.1   34.3  36.0   Platelets 150 - 400 K/uL 419  385  405        Latest Ref Rng & Units 01/17/2024    6:29 AM 01/16/2024    5:17 AM 01/15/2024    5:28 AM  CMP  Glucose 70 - 99 mg/dL 883  875  835   BUN 8 - 23 mg/dL 20  27  18    Creatinine 0.44 - 1.00 mg/dL 9.46  9.41  9.35   Sodium 135 - 145 mmol/L 139  137  139   Potassium 3.5 - 5.1 mmol/L 3.7  3.8  4.0   Chloride 98 - 111 mmol/L 107  105  104   CO2 22 - 32 mmol/L 27  26  21    Calcium  8.9 - 10.3 mg/dL 8.8  8.8  8.7       Microbiology: 8/24 -  BC both sets 1 bottle each has MRSA 8/24 MRSA nares positive 8/26 BC Sent Studies/Results: ECHO TEE Result Date: 01/17/2024    TRANSESOPHOGEAL ECHO REPORT   Patient Name:   Diane Chambers Date of Exam: 01/17/2024 Medical Rec #:  [id number]    Height:       66.5 in Accession #:    [id number]   Weight:       111.3 lb Date of Birth:  February 16, 1934     BSA:          1.568 m Patient Age:    89 years     BP:           140/75 mmHg Patient Gender: F            HR:           95 bpm. Exam Location:  [facility] Procedure: Transesophageal Echo, Color Doppler and Saline Contrast Bubble Study            (Both Spectral and Color Flow Doppler were utilized during            procedure). Indications:     Bacteremia  History:         Patient has prior history of Echocardiogram examinations, most                  recent 01/15/2024.  Sonographer:     Ashley McNeely-Sloane Referring Phys:  [id number] [facility] Diagnosing Phys: Evalene Lunger MD PROCEDURE: After discussion of the risks and benefits of a TEE, an informed consent  was obtained from the patient. TEE procedure time was 30 minutes. The transesophogeal probe was passed without difficulty through the esophogus of the patient. Imaged were obtained with the patient in a left lateral decubitus position. Sedation performed by performing physician. Patients was under conscious sedation during this procedure. Anesthetic administered: 75mcg of Fentanyl , 1.5mg  of Versed . Image quality was  excellent. The patient's vital signs; including heart rate, blood pressure, and oxygen saturation; remained stable throughout the procedure. The patient developed no complications during the procedure.  IMPRESSIONS  1. Left ventricular ejection fraction, by estimation, is 55 to 60%. The left ventricle has normal function. The left ventricle has no regional wall motion abnormalities. Left ventricular diastolic parameters are indeterminate.  2. Right ventricular systolic function is normal. The right ventricular size is normal.  3. No left atrial/left atrial appendage thrombus was detected.  4. The mitral valve is normal in structure. Mild to moderate mitral valve regurgitation. No evidence of mitral stenosis.  5. The aortic valve is normal in structure. Aortic valve regurgitation is trivial. No aortic stenosis is present.  6. The inferior vena cava is normal in size with greater than 50% respiratory variability, suggesting right atrial pressure of 3 mmHg.  7. Agitated saline contrast bubble study was negative, with no evidence of any interatrial shunt.  8. Conclusion(s)/Recommendation(s): Normal biventricular function without evidence of hemodynamically significant valvular heart disease. FINDINGS  Left Ventricle: Left ventricular ejection fraction, by estimation, is 55 to 60%. The left ventricle has normal function. The left ventricle has no regional wall motion abnormalities. The left ventricular internal cavity size was normal in size. There is  no left ventricular hypertrophy. Left ventricular diastolic parameters are indeterminate. Right Ventricle: The right ventricular size is normal. No increase in right ventricular wall thickness. Right ventricular systolic function is normal. Left Atrium: Left atrial size was normal in size. No left atrial/left atrial appendage thrombus was detected. Right Atrium: Right atrial size was normal in size. Pericardium: There is no evidence of pericardial effusion. Mitral Valve:  The mitral valve is normal in structure. Mild to moderate mitral valve regurgitation. No evidence of mitral valve stenosis. There is no evidence of mitral valve vegetation. Tricuspid Valve: The tricuspid valve is normal in structure. Tricuspid valve regurgitation is mild . No evidence of tricuspid stenosis. There is no evidence of tricuspid valve vegetation. Aortic Valve: The aortic valve is normal in structure. Aortic valve regurgitation is trivial. No aortic stenosis is present. There is no evidence of aortic valve vegetation. Pulmonic Valve: The pulmonic valve was normal in structure. Pulmonic valve regurgitation is not visualized. No evidence of pulmonic stenosis. There is no evidence of pulmonic valve vegetation. Aorta: The aortic root is normal in size and structure. Venous: The inferior vena cava is normal in size with greater than 50% respiratory variability, suggesting right atrial pressure of 3 mmHg. IAS/Shunts: No atrial level shunt detected by color flow Doppler. Agitated saline contrast was given intravenously to evaluate for intracardiac shunting. Agitated saline contrast bubble study was negative, with no evidence of any interatrial shunt. There  is no evidence of a patent foramen ovale. There is no evidence of an atrial septal defect. Additional Comments: 3D was performed not requiring image post processing on an independent workstation and was indeterminate. Timothy Gollan MD Electronically signed by Evalene Lunger MD Signature Date/Time: 01/17/2024/2:07:50 PM    Final (Updated)      Assessment/Plan: MRSA bacteremia 2 d echo no obvious vegetation Repeat blood culture Neg so far TEE  today  Pt is on Vancomycin  IV Spoke to lab to get Daptomycin  MIC  Meta pneumovirus  bronchitis and lower resp infection   AFIB with RVR on metoprolol  and eliquis   Anemia  H/o CHF  H/o crusted scabies treated in [place]  H/o CVA- some residual left sided weakness  Vascular dementia  B/l  TKA Hypothyroidism- on synthroid   Discussed with patient

## 2024-01-17 NOTE — Progress Notes (Signed)
 Physical Therapy Treatment Patient Details Name: Diane Chambers MRN: [id number] DOB: August 17, 1933 Today's Date: 01/17/2024   History of Present Illness Pt is an 89 y.o. female with medical history significant of A fib on Eliquis , dCHF, HTN, HLD, multiple stroke with left arm weakness, vascular dementia, hypothyroidism, colitis, carotid artery stenosis, who presents with SOB. MD assessment includes: severe sepsis due to acute bronchitis and possible staph bacteremia, chronic atrial fibrillation with RVR, and myocardial injury with troponin 35, likely demand ischemia.    PT Comments  Patient received in bed she is pleasant and agrees to PT session. Patient is HOH. She is cga for bed mobility and min A for sit to stand. Cga for ambulation into bathroom and back to recliner. Occasional cues for safe use of AD and safety with mobility. Patient has some L UE weakness, however is able to hold to walker effectively. She will continue to benefit from skilled PT to improve safety and independence with mobility.       If plan is discharge home, recommend the following: A little help with walking and/or transfers;A little help with bathing/dressing/bathroom;Assist for transportation;Supervision due to cognitive status;Direct supervision/assist for medications management;Direct supervision/assist for financial management   Can travel by private vehicle     Yes  Equipment Recommendations  Rolling walker (2 wheels)    Recommendations for Other Services       Precautions / Restrictions Precautions Precautions: Fall Recall of Precautions/Restrictions: Impaired Restrictions Weight Bearing Restrictions Per Provider Order: No     Mobility  Bed Mobility Overal bed mobility: Needs Assistance Bed Mobility: Supine to Sit     Supine to sit: HOB elevated, Used rails, Contact guard          Transfers Overall transfer level: Needs assistance Equipment used: Rolling walker (2 wheels) Transfers: Sit to/from  Stand Sit to Stand: Min assist                Ambulation/Gait Ambulation/Gait assistance: Contact guard assist Gait Distance (Feet): 25 Feet Assistive device: Rolling walker (2 wheels) Gait Pattern/deviations: Step-through pattern, Decreased step length - right, Decreased step length - left, Decreased stride length Gait velocity: decreased     General Gait Details: cues needed for safety with mobility and occasional use of RW ( negotiating in tight spaces)   Stairs             Wheelchair Mobility     Tilt Bed    Modified Rankin (Stroke Patients Only)       Balance Overall balance assessment: Needs assistance Sitting-balance support: Feet supported Sitting balance-Leahy Scale: Good     Standing balance support: Bilateral upper extremity supported, During functional activity, Reliant on assistive device for balance Standing balance-Leahy Scale: Fair                              Hotel manager: Impaired Factors Affecting Communication: Hearing impaired  Cognition Arousal: Alert Behavior During Therapy: WFL for tasks assessed/performed   PT - Cognitive impairments: No family/caregiver present to determine baseline, Problem solving, Safety/Judgement, Sequencing, Initiation                         Following commands: Impaired Following commands impaired: Follows one step commands with increased time    Cueing Cueing Techniques: Verbal cues, Gestural cues  Exercises      General Comments        Pertinent Vitals/Pain  Pain Assessment Pain Assessment: No/denies pain    Home Living                          Prior Function            PT Goals (current goals can now be found in the care plan section) Acute Rehab PT Goals Patient Stated Goal: To walk better PT Goal Formulation: With patient Time For Goal Achievement: 01/29/24 Potential to Achieve Goals: Good Progress towards PT goals:  Progressing toward goals    Frequency    Min 2X/week      PT Plan      Co-evaluation PT/OT/SLP Co-Evaluation/Treatment: Yes Reason for Co-Treatment: For patient/therapist safety;To address functional/ADL transfers PT goals addressed during session: Mobility/safety with mobility;Balance;Proper use of DME        AM-PAC PT 6 Clicks Mobility   Outcome Measure  Help needed turning from your back to your side while in a flat bed without using bedrails?: A Little Help needed moving from lying on your back to sitting on the side of a flat bed without using bedrails?: A Little Help needed moving to and from a bed to a chair (including a wheelchair)?: A Little Help needed standing up from a chair using your arms (e.g., wheelchair or bedside chair)?: A Little Help needed to walk in hospital room?: A Little Help needed climbing 3-5 steps with a railing? : A Lot 6 Click Score: 17    End of Session Equipment Utilized During Treatment: Gait belt Activity Tolerance: Patient tolerated treatment well Patient left: in chair;with call bell/phone within reach;with chair alarm set Nurse Communication: Mobility status PT Visit Diagnosis: Unsteadiness on feet (R26.81);Difficulty in walking, not elsewhere classified (R26.2);Muscle weakness (generalized) (M62.81);Hemiplegia and hemiparesis Hemiplegia - Right/Left: Left Hemiplegia - dominant/non-dominant: Non-dominant Hemiplegia - caused by: Cerebral infarction     Time: 9082-9054 PT Time Calculation (min) (ACUTE ONLY): 28 min  Charges:    $Gait Training: 8-22 mins PT General Charges $$ ACUTE PT VISIT: 1 Visit                     Makayle Krahn, PT, GCS 01/17/24,10:09 AM

## 2024-01-18 ENCOUNTER — Other Ambulatory Visit: Payer: Self-pay

## 2024-01-18 ENCOUNTER — Encounter: Payer: Self-pay | Admitting: Cardiovascular Disease

## 2024-01-18 DIAGNOSIS — B9561 Methicillin susceptible Staphylococcus aureus infection as the cause of diseases classified elsewhere: Secondary | ICD-10-CM

## 2024-01-18 DIAGNOSIS — R7881 Bacteremia: Secondary | ICD-10-CM | POA: Diagnosis not present

## 2024-01-18 LAB — CBC WITH DIFFERENTIAL/PLATELET
Abs Immature Granulocytes: 0.04 K/uL (ref 0.00–0.07)
Basophils Absolute: 0 K/uL (ref 0.0–0.1)
Basophils Relative: 0 %
Eosinophils Absolute: 0.1 K/uL (ref 0.0–0.5)
Eosinophils Relative: 1 %
HCT: 34.8 % — ABNORMAL LOW (ref 36.0–46.0)
Hemoglobin: 11.2 g/dL — ABNORMAL LOW (ref 12.0–15.0)
Immature Granulocytes: 1 %
Lymphocytes Relative: 31 %
Lymphs Abs: 2.2 K/uL (ref 0.7–4.0)
MCH: 30.9 pg (ref 26.0–34.0)
MCHC: 32.2 g/dL (ref 30.0–36.0)
MCV: 96.1 fL (ref 80.0–100.0)
Monocytes Absolute: 0.6 K/uL (ref 0.1–1.0)
Monocytes Relative: 8 %
Neutro Abs: 4.2 K/uL (ref 1.7–7.7)
Neutrophils Relative %: 59 %
Platelets: 468 K/uL — ABNORMAL HIGH (ref 150–400)
RBC: 3.62 MIL/uL — ABNORMAL LOW (ref 3.87–5.11)
RDW: 15.5 % (ref 11.5–15.5)
Smear Review: NORMAL
WBC: 7.1 K/uL (ref 4.0–10.5)
nRBC: 0 % (ref 0.0–0.2)

## 2024-01-18 LAB — URINE CULTURE: Culture: >=100000 — AB

## 2024-01-18 LAB — BASIC METABOLIC PANEL WITH GFR
Anion gap: 5 (ref 5–15)
BUN: 20 mg/dL (ref 8–23)
CO2: 28 mmol/L (ref 22–32)
Calcium: 8.7 mg/dL — ABNORMAL LOW (ref 8.9–10.3)
Chloride: 105 mmol/L (ref 98–111)
Creatinine, Ser: 0.52 mg/dL (ref 0.44–1.00)
GFR, Estimated: >60 mL/min (ref >60)
Glucose, Bld: 87 mg/dL (ref 70–99)
Potassium: 3.7 mmol/L (ref 3.5–5.1)
Sodium: 138 mmol/L (ref 135–145)

## 2024-01-18 MED ORDER — DOXYCYCLINE HYCLATE 100 MG PO TABS
100.0000 mg | ORAL_TABLET | Freq: Two times a day (BID) | ORAL | Status: DC
  Administered 2024-01-18 – 2024-01-22 (×8): 100 mg via ORAL
  Filled 2024-01-18 (×8): qty 1

## 2024-01-18 MED ORDER — SODIUM CHLORIDE 0.9 % IV SOLN
8.0000 mg/kg | Freq: Every day | INTRAVENOUS | Status: DC
  Administered 2024-01-19 – 2024-01-21 (×3): 400 mg via INTRAVENOUS
  Filled 2024-01-18 (×4): qty 8

## 2024-01-18 NOTE — Progress Notes (Signed)
 PHARMACY CONSULT NOTE FOR:  OUTPATIENT  PARENTERAL ANTIBIOTIC THERAPY (OPAT)  Indication: MRSA bacteremia Regimen: Daptomycin  8mg /kg body weight = 400mg  IV every 24 hours  End date: 02/13/24  [facility] Care Per Protocol:   Labs weekly while on IV antibiotics: _X_ CBC with differential _X_ CK _X_ CMP   While on IV daptomycin  watch for eosinophilia, rhabdomyolysis ( increase in CK and pneumonia   If any of the above then hold antibiotic and contact Dr.Ravishankar for further guidance     _X_ Please pull PIC at completion of IV antibiotics  IV antibiotic discharge orders are pended. To discharging provider:  please sign these orders via discharge navigator,  Select New Orders & click on the button choice - Manage This Unsigned Work.     Thank you for allowing pharmacy to be a part of this patient's care.  Lum VEAR Mania, PharmD, BCPS 01/18/2024, 6:03 PM

## 2024-01-18 NOTE — Progress Notes (Signed)
 Date of Admission:  01/14/2024   T  ID: Diane Chambers is a 89 y.o. female  Principal Problem:   Acute bronchitis Active Problems:   TIA (transient ischemic attack)   Essential hypertension   Sepsis (HCC)   HLD (hyperlipidemia)   Chronic diastolic CHF (congestive heart failure) (HCC)   Hypothyroidism   Myocardial injury   Chronic atrial fibrillation with RVR (HCC)   Bacteremia   Atrial fibrillation with RVR (HCC)   Pneumonia due to human metapneumovirus   MRSA bacteremia   Endocarditis  89 y.o. female with a history of Afib on eliquis , HTN, HLD, CVA, CHF, left TKA, laminectomy presents from SNF  with cough and shorthess of breath of 3 days duration. She also has decreased appetite and generalized weakness She has fever and chills. I spoke to her son [name] and then asked [facility] to fax her discharge summary and I reviewed that  Pt used to live in [place] . In June 2025 /Juy 2025 she was admitted to [facility] and [facility] due to severe case of crusted scabies with hypereosinophilia  , she had multiple  strokes She was treated for scabies and then came to [facility] at the end of July 2025. She has h/o afib and had undergone cardioversion at one time  Subjective: Pt is doing fine Cough better  Medications:   apixaban   2.5 mg Oral BID   ascorbic acid   500 mg Oral Daily   atorvastatin   20 mg Oral Daily   B-complex with vitamin C   1 tablet Oral Daily   cholecalciferol   1,000 Units Oral Daily   empagliflozin   10 mg Oral Daily   furosemide   40 mg Oral Daily   gabapentin   100 mg Oral TID   ipratropium-albuterol   3 mL Nebulization BID   levothyroxine   137 mcg Oral q AM   loratadine   10 mg Oral Daily   metoprolol  tartrate  50 mg Oral BID   multivitamin with minerals  1 tablet Oral Daily   mupirocin  ointment  1 Application Nasal BID   sertraline   25 mg Oral Daily   traZODone   100 mg Oral QHS    triamcinolone  ointment  1 Application Topical QHS    Objective: Vital signs in last 24 hours: Patient Vitals for the past 24 hrs:  BP Temp Temp src Pulse Resp SpO2 Weight  01/18/24 1100 -- 97.9 F (36.6 C) Oral -- -- -- --  01/18/24 0900 -- -- -- 86 14 97 % --  01/18/24 0855 138/78 (!) 97.5 F (36.4 C) Oral 83 13 96 % --  01/18/24 0850 -- -- -- 68 14 98 % --  01/18/24 0845 -- -- -- 64 13 97 % --  01/18/24 0840 -- -- -- 81 17 94 % --  01/18/24 0835 -- -- -- 85 16 99 % --  01/18/24 0830 -- -- -- 80 13 100 % --  01/18/24 0825 -- -- -- 77 18 100 % --  01/18/24 0823 -- -- -- -- -- 100 % --  01/18/24 0820 -- -- -- 86 20 99 % --  01/18/24 0815 -- -- -- 88 20 97 % --  01/18/24 0810 -- -- -- 78 (!) 23 98 % --  01/18/24 0805 -- -- -- 85 (!) 24 97 % --  01/18/24 0800 -- -- -- 84 (!) 24 98 % --  01/18/24 0500 -- -- -- -- -- -- 52.1 kg  01/18/24 0441 -- (!) 97.1 F (36.2 C) -- -- -- -- --  01/18/24 0011 -- (!) 97.5 F (36.4 C) -- -- -- -- --  01/17/24 1938 -- 97.6 F (36.4 C) -- -- -- -- --     Lines and Device Date on insertion # of days DC  Central line Port/Pacemaker     Foley     ETT       PHYSICAL EXAM:  General: Alert, cooperative, no distress, appears stated age.  Lungs: b/l air entry  Heart:irregular. Abdomen: Soft, non-tender,not distended. Bowel sounds normal. No masses Extremities: b/l knee surgical scars Skin: no lesions Rt cheek verrucous lesion Lymph: Cervical, supraclavicular normal. Neurologic: Grossly non-focal  Lab Results    Latest Ref Rng & Units 01/18/2024    6:35 AM 01/17/2024    6:29 AM 01/15/2024    5:28 AM  CBC  WBC 4.0 - 10.5 K/uL 7.1  7.4  7.5   Hemoglobin 12.0 - 15.0 g/dL 88.7  89.4  89.0   Hematocrit 36.0 - 46.0 % 34.8  33.1  34.3   Platelets 150 - 400 K/uL 468  419  385        Latest Ref Rng & Units 01/18/2024    6:35 AM 01/17/2024    6:29 AM 01/16/2024    5:17 AM  CMP  Glucose 70 - 99 mg/dL 87  883  875   BUN 8 - 23 mg/dL 20  20  27     Creatinine 0.44 - 1.00 mg/dL 9.47  9.46  9.41   Sodium 135 - 145 mmol/L 138  139  137   Potassium 3.5 - 5.1 mmol/L 3.7  3.7  3.8   Chloride 98 - 111 mmol/L 105  107  105   CO2 22 - 32 mmol/L 28  27  26    Calcium  8.9 - 10.3 mg/dL 8.7  8.8  8.8       Microbiology: 8/24 -  BC both sets 1 bottle each has MRSA 8/24 MRSA nares positive 8/26 BC Sent Studies/Results: ECHO TEE Result Date: 01/17/2024    TRANSESOPHOGEAL ECHO REPORT   Patient Name:   DENISIA HARPOLE Date of Exam: 01/17/2024 Medical Rec #:  [id number]    Height:       66.5 in Accession #:    [id number]   Weight:       111.3 lb Date of Birth:  [birth year]     BSA:          1.568 m Patient Age:    89 years     BP:           140/75 mmHg Patient Gender: F            HR:           95 bpm. Exam Location:  [facility] Procedure: Transesophageal Echo, Color Doppler and Saline Contrast Bubble Study            (Both Spectral and Color Flow Doppler were utilized during            procedure). Indications:     Bacteremia  History:         Patient has prior history of Echocardiogram examinations, most                  recent 01/15/2024.  Sonographer:     Ashley McNeely-Sloane Referring Phys:  [id number] [facility] L CAREY Diagnosing Phys: Evalene Lunger MD PROCEDURE: After discussion of the risks and benefits of  a TEE, an informed consent was obtained from the patient. TEE procedure time was 30 minutes. The transesophogeal probe was passed without difficulty through the esophogus of the patient. Imaged were obtained with the patient in a left lateral decubitus position. Sedation performed by performing physician. Patients was under conscious sedation during this procedure. Anesthetic administered: 75mcg of Fentanyl , 1.5mg  of Versed . Image quality was excellent. The patient's vital signs; including heart rate, blood pressure, and oxygen saturation; remained stable throughout the procedure. The patient developed no complications during the procedure.  IMPRESSIONS  1. Left  ventricular ejection fraction, by estimation, is 55 to 60%. The left ventricle has normal function. The left ventricle has no regional wall motion abnormalities. Left ventricular diastolic parameters are indeterminate.  2. Right ventricular systolic function is normal. The right ventricular size is normal.  3. No left atrial/left atrial appendage thrombus was detected.  4. The mitral valve is normal in structure. Mild to moderate mitral valve regurgitation. No evidence of mitral stenosis.  5. The aortic valve is normal in structure. Aortic valve regurgitation is trivial. No aortic stenosis is present.  6. The inferior vena cava is normal in size with greater than 50% respiratory variability, suggesting right atrial pressure of 3 mmHg.  7. Agitated saline contrast bubble study was negative, with no evidence of any interatrial shunt.  8. Conclusion(s)/Recommendation(s): Normal biventricular function without evidence of hemodynamically significant valvular heart disease. FINDINGS  Left Ventricle: Left ventricular ejection fraction, by estimation, is 55 to 60%. The left ventricle has normal function. The left ventricle has no regional wall motion abnormalities. The left ventricular internal cavity size was normal in size. There is  no left ventricular hypertrophy. Left ventricular diastolic parameters are indeterminate. Right Ventricle: The right ventricular size is normal. No increase in right ventricular wall thickness. Right ventricular systolic function is normal. Left Atrium: Left atrial size was normal in size. No left atrial/left atrial appendage thrombus was detected. Right Atrium: Right atrial size was normal in size. Pericardium: There is no evidence of pericardial effusion. Mitral Valve: The mitral valve is normal in structure. Mild to moderate mitral valve regurgitation. No evidence of mitral valve stenosis. There is no evidence of mitral valve vegetation. Tricuspid Valve: The tricuspid valve is normal in  structure. Tricuspid valve regurgitation is mild . No evidence of tricuspid stenosis. There is no evidence of tricuspid valve vegetation. Aortic Valve: The aortic valve is normal in structure. Aortic valve regurgitation is trivial. No aortic stenosis is present. There is no evidence of aortic valve vegetation. Pulmonic Valve: The pulmonic valve was normal in structure. Pulmonic valve regurgitation is not visualized. No evidence of pulmonic stenosis. There is no evidence of pulmonic valve vegetation. Aorta: The aortic root is normal in size and structure. Venous: The inferior vena cava is normal in size with greater than 50% respiratory variability, suggesting right atrial pressure of 3 mmHg. IAS/Shunts: No atrial level shunt detected by color flow Doppler. Agitated saline contrast was given intravenously to evaluate for intracardiac shunting. Agitated saline contrast bubble study was negative, with no evidence of any interatrial shunt. There  is no evidence of a patent foramen ovale. There is no evidence of an atrial septal defect. Additional Comments: 3D was performed not requiring image post processing on an independent workstation and was indeterminate. Timothy Gollan MD Electronically signed by Evalene Lunger MD Signature Date/Time: 01/17/2024/2:07:50 PM    Final (Updated)      Assessment/Plan: MRSA bacteremia Repeat blood culture Neg so far TEE Neg  from 8/27 No pain in the spine B/l TKA sites are fine I would presume that MRSA bacteremia was secondary to metapneumovirus  bronchitis/pneumonia   Pt is on Vancomycin  IV Will switch to Dapto for total of 4 weeks as Vanco mycin high risk for kiney injury in elderly and needs close monitoring of levels  With dapto need to ceck weekly CK, cbc, cmp and observe closely for eosinophilia or rhabdo Will do doxy 100mg  PO BID for lung for 2 week total  We need to make sure that SNF has daptomycin  on hand and can administer without any delay as it will be the  weekend   OPAT orders placed  Meta pneumovirus  bronchitis and lower resp infection   AFIB with RVR on metoprolol  and eliquis   Anemia  H/o CHF  H/o crusted scabies treated in [place]  H/o CVA- some residual left sided weakness  Vascular dementia  B/l TKA Hypothyroidism- on synthroid   Discussed with patient, and son in detail Discussed with Dr.Djan Will follow her as OP

## 2024-01-18 NOTE — Treatment Plan (Signed)
 Diagnosis: MRSA bacteremia Baseline Creatinine <1    Allergies  Allergen Reactions   Calcium -Containing Compounds Other (See Comments)    Restless legs   Lactose Intolerance (Gi) Diarrhea    OPAT Orders Discharge antibiotics: Daptomycin  8mg /kg body weight = 400mg  IV every 24 hours  Duration: 4 weeks End Date:02/13/24  And  Doxycycline  100mg  PO Q 12 for 2 weeks End date 01/28/24   [facility] Care Per Protocol:  Labs weekly while on IV antibiotics: _X_ CBC with differential _X_ CK _X_ CMP  While on IV daptomycin  watch for eosinophilia, rhabdomyolysis ( increase in CK and pneumonia  If any of the above then hold antibiotic and contact Dr.Jecenia Leamer for further guidance   _X_ Please pull PIC at completion of IV antibiotics   Fax weekly lab results  promptly to [phone] and [phone]  Clinic Follow Up Appt: 02/06/24 at 11.30 AM   Call [phone] with critical values or questions

## 2024-01-18 NOTE — Progress Notes (Signed)
 Occupational Therapy Treatment Patient Details Name: Diane Chambers MRN: [id number] DOB: [birth year] Today's Date: 01/18/2024   History of present illness Pt is an 89 y.o. female with medical history significant of A fib on Eliquis , dCHF, HTN, HLD, multiple stroke with left arm weakness, vascular dementia, hypothyroidism, colitis, carotid artery stenosis, who presents with SOB. MD assessment includes: severe sepsis due to acute bronchitis and possible staph bacteremia, chronic atrial fibrillation with RVR, and myocardial injury with troponin 35, likely demand ischemia.   OT comments  Pt seen for OT treatment on this date. Upon arrival to room pt semi supine in bed, agreeable to tx. Pt requires MINA for supine<>sitting on the EOB, CGA STS from slightly elevated bed height with RW, CGA throughout amb to the bathroom with use of RW. Noted slow yet steady gait, requiring an increase in physical assistance for DME management as pt became more fatigued. Pt required MODA for pericare in standing as pt was soiled. Pt returned to bed, declining sitting up in the recliner. RN in room to assess rash throughout peri area. Pt making fair progress toward goals, will continue to follow POC. Discharge recommendation remains appropriate.        If plan is discharge home, recommend the following:  A lot of help with walking and/or transfers;A lot of help with bathing/dressing/bathroom;Supervision due to cognitive status   Equipment Recommendations  Other (comment)    Recommendations for Other Services      Precautions / Restrictions Precautions Precautions: Fall Recall of Precautions/Restrictions: Impaired Restrictions Weight Bearing Restrictions Per Provider Order: No       Mobility Bed Mobility Overal bed mobility: Needs Assistance Bed Mobility: Sit to Supine, Supine to Sit, Rolling Rolling: Used rails (with cues)   Supine to sit: Min assist, Used rails, HOB elevated Sit to supine: Contact guard  assist, HOB elevated, Used rails   General bed mobility comments: tactile/verbal cues required for sequencing and technique    Transfers Overall transfer level: Needs assistance Equipment used: Rolling walker (2 wheels) Transfers: Sit to/from Stand Sit to Stand: Contact guard assist           General transfer comment: Close CGA for STS from slightly elevated EOB with heavy reliance on RW     Balance Overall balance assessment: Needs assistance Sitting-balance support: Feet supported Sitting balance-Leahy Scale: Good     Standing balance support: Bilateral upper extremity supported, During functional activity, Reliant on assistive device for balance Standing balance-Leahy Scale: Fair Standing balance comment: Fair static standing with RW, no LOB                           ADL either performed or assessed with clinical judgement   ADL Overall ADL's : Needs assistance/impaired Eating/Feeding: Set up;Sitting   Grooming: Wash/dry face;Brushing hair;Sitting;Set up       Lower Body Bathing: Maximal assistance;Sit to/from stand       Lower Body Dressing: Maximal assistance;Sit to/from stand   Toilet Transfer: Minimal assistance;Ambulation;Rolling walker (2 wheels);Comfort height toilet;Contact guard assist   Toileting- Clothing Manipulation and Hygiene: Moderate assistance;Sit to/from stand Toileting - Clothing Manipulation Details (indicate cue type and reason): peri care     Functional mobility during ADLs: Contact guard assist;Minimal assistance;Rolling walker (2 wheels) (MINA for cues and DME management)       Communication Communication Communication: Impaired Factors Affecting Communication: Hearing impaired   Cognition Arousal: Alert Behavior During Therapy: WFL for tasks assessed/performed Cognition: History  of cognitive impairments, No family/caregiver present to determine baseline, Cognition impaired       Memory impairment (select all  impairments): Declarative long-term memory Attention impairment (select first level of impairment): Selective attention Executive functioning impairment (select all impairments): Sequencing, Problem solving                   Following commands: Impaired Following commands impaired: Follows one step commands with increased time      Cueing   Cueing Techniques: Verbal cues, Gestural cues  Exercises Exercises: Other exercises Other Exercises Other Exercises: Edu: Role of OT session, DME management           General Comments RN notified of breakdown in peri area and need for clean barrier pad - RN in room    Pertinent Vitals/ Pain       Pain Assessment Pain Assessment: No/denies pain                                                          Frequency  Min 2X/week        Progress Toward Goals  OT Goals(current goals can now be found in the care plan section)  Progress towards OT goals: Progressing toward goals  Acute Rehab OT Goals OT Goal Formulation: With patient Time For Goal Achievement: 01/30/24 Potential to Achieve Goals: Good ADL Goals Pt Will Perform Grooming: sitting;with supervision Pt Will Perform Lower Body Dressing: with mod assist;sit to/from stand;sitting/lateral leans Pt Will Transfer to Toilet: with contact guard assist;ambulating Pt Will Perform Toileting - Clothing Manipulation and hygiene: sitting/lateral leans;sit to/from stand;with min assist   AM-PAC OT 6 Clicks Daily Activity     Outcome Measure   Help from another person eating meals?: A Little Help from another person taking care of personal grooming?: A Little Help from another person toileting, which includes using toliet, bedpan, or urinal?: A Lot Help from another person bathing (including washing, rinsing, drying)?: A Lot Help from another person to put on and taking off regular upper body clothing?: A Little Help from another person to put on and  taking off regular lower body clothing?: A Lot 6 Click Score: 15    End of Session Equipment Utilized During Treatment: Rolling walker (2 wheels);Gait belt  OT Visit Diagnosis: Other abnormalities of gait and mobility (R26.89);Muscle weakness (generalized) (M62.81)   Activity Tolerance Patient tolerated treatment well   Patient Left in bed;with bed alarm set;with call bell/phone within reach   Nurse Communication Other (comment) (Pericare breakdown and rash on buttocks)        Time: 1420-1509 OT Time Calculation (min): 49 min  Charges: OT General Charges $OT Visit: 1 Visit OT Treatments $Self Care/Home Management : 23-37 mins $Therapeutic Activity: 8-22 mins  Larraine Colas M.S. OTR/L  01/18/24, 3:27 PM

## 2024-01-18 NOTE — Progress Notes (Signed)
 Progress Note   Patient: Diane Chambers FMW:[id number] DOB: [birth year] DOA: 01/14/2024     4 DOS: the patient was seen and examined on 01/18/2024   Brief hospital course:   HPI on admission: Diane Chambers is a 89 y.o. female with medical history significant of A fib on Eliquis , dCHF, HTN, HLD, multiple stroke with left arm weakness, vascular dementia, hypothyroidism, colitis, carotid artery stenosis, who presents with SOB.   Per patient and her son at the bedside, patient has generalized weakness and decreased appetite and oral intake for more than 3 days.  Since this morning, patient developed SOB, which has been progressively worsening.  Patient has dry cough, no chest pain. Patient has fever and chills.  Her temperature is 100.7 in ED.  Per report, patient has oxygen desaturation to 87% in facility.  He is oxygen saturation is 94% in the ED.  Her son states that patient's heart rate was up to 175 earlier.  Her heart rate is 140-160 in ED.... See H&P for full HPI on admission & ED course.   Patient was admitted for further evaluation and management of acute bronchitis complicated by A-fib with RVR requiring Cardizem  infusion for heart rate control.  Pt was started on Rocephin  and Zithromax .   On next hospital day, blood cultures positive for staph, as well as MRSA positive nares PCR screen.  Vancomycin  was added and Infectious disease consulted.   Further hospital course and management as outlined below.     Assessment and Plan:   Severe Sepsis due to Staph bacteremia, acute bronchitis, metapneumovirus infection Pt met criteria for severe sepsis on admission with fever 100.7 and tachycardia with A-fib with RVR.  Lactic acid elevated 2.6-->  1.6.  +Blood cultures --Continue Rocephin , Zithromax  --Vancomycin  added 8/25 We will follow infectious disease recommendation concerning discharge antibiotics --Patient underwent TEE on 01/17/2024 that was normal --Follow sputum cultures - RT to induce  sputum if needed --Follow blood cultures & obtain repeats --IS, flutter for pulmonary hygiene --Mucolytics PRN --Prednisone  --Duonebs   Chronic atrial fibrillation with RVR  --Continue Eliquis  --Increased home metoprolol  25 mg daily to 50 mg BID (Unclear reason for once daily dosing for metoprolol  tartrate as is home dose) --Has been weaned off Cardizem  drip --Telemetry --Maintain K>4, Mg>2 --Mgmt of acute illness above, precipitating RVR    Chronic diastolic CHF (congestive heart failure) (HCC): 2D echo on 12/30/2014 showed EF of 55 to 60% with grade 1 diastolic dysfunction.  Patient has had elevated BNP of 293.   Echo this admission -- EF 65-70%, indeterminate diastolic paramters, mild-mod MR, mild-mod TR, mild AR Continue Lasix  --Monitor volume status, renal function & electrolytes   Myocardial injury: Troponin 35 --> 34, likely demand ischemia due to Afib RVR and sepsis --deferred aspirin  since patient is on Eliquis  Continue Lipitor   Hypothyroidism: TSH and T4 normal -Continue Synthroid  175 mcg daily   TIA (transient ischemic attack) -On Eliquis  and Lipitor   Essential hypertension - IV hydralazine as needed - Metoprolol    HLD (hyperlipidemia) - Lipitor     Subjective:    Denies nausea vomiting abdominal pain cough Patient underwent TEE yesterday that was normal Plan of care discussed with infectious disease   Physical Exam:   General exam: awake, alert, no acute distress HEENT: moist mucus membranes, hearing grossly normal  Respiratory system: CTAB, rhonchi vs upper airway secretion sounds, normal respiratory effort at rest on room air. Cardiovascular system: normal S1/S2, RRR, no pedal edema.   Gastrointestinal system:  soft, NT, ND, +bowel sounds. Central nervous system: A&O x2+. no gross focal neurologic deficits, normal speech Skin: dry, intact, normal temperature Psychiatry: normal mood, congruent affect, judgement and insight appear normal       Family Communication: Discussed with patient's son at bedside Disposition: Status is: Inpatient Remains inpatient appropriate because: remains on IV therapies as above, ongoing evaluation for bacteremia with TEE planned.      Planned Discharge Destination: Home   Data Reviewed:    Latest Ref Rng & Units 01/18/2024    6:35 AM 01/17/2024    6:29 AM 01/15/2024    5:28 AM  CBC  WBC 4.0 - 10.5 K/uL 7.1  7.4  7.5   Hemoglobin 12.0 - 15.0 g/dL 88.7  89.4  89.0   Hematocrit 36.0 - 46.0 % 34.8  33.1  34.3   Platelets 150 - 400 K/uL 468  419  385        Latest Ref Rng & Units 01/18/2024    6:35 AM 01/17/2024    6:29 AM 01/16/2024    5:17 AM  BMP  Glucose 70 - 99 mg/dL 87  883  875   BUN 8 - 23 mg/dL 20  20  27    Creatinine 0.44 - 1.00 mg/dL 9.47  9.46  9.41   Sodium 135 - 145 mmol/L 138  139  137   Potassium 3.5 - 5.1 mmol/L 3.7  3.7  3.8   Chloride 98 - 111 mmol/L 105  107  105   CO2 22 - 32 mmol/L 28  27  26    Calcium  8.9 - 10.3 mg/dL 8.7  8.8  8.8      Vitals:   01/18/24 0855 01/18/24 0900 01/18/24 1100 01/18/24 1500  BP: 138/78     Pulse: 83 86    Resp: 13 14  16   Temp: (!) 97.5 F (36.4 C)  97.9 F (36.6 C) 97.9 F (36.6 C)  TempSrc: Oral  Oral Oral  SpO2: 96% 97%    Weight:      Height:         Author: Drue ONEIDA Potter, MD 01/18/2024 4:53 PM  For on call review [email or web address].

## 2024-01-19 DIAGNOSIS — R7881 Bacteremia: Secondary | ICD-10-CM | POA: Diagnosis not present

## 2024-01-19 DIAGNOSIS — B9561 Methicillin susceptible Staphylococcus aureus infection as the cause of diseases classified elsewhere: Secondary | ICD-10-CM | POA: Diagnosis not present

## 2024-01-19 LAB — CBC WITH DIFFERENTIAL/PLATELET
Abs Immature Granulocytes: 0.05 K/uL (ref 0.00–0.07)
Basophils Absolute: 0.1 K/uL (ref 0.0–0.1)
Basophils Relative: 1 %
Eosinophils Absolute: 0.6 K/uL — ABNORMAL HIGH (ref 0.0–0.5)
Eosinophils Relative: 6 %
HCT: 37.5 % (ref 36.0–46.0)
Hemoglobin: 11.7 g/dL — ABNORMAL LOW (ref 12.0–15.0)
Immature Granulocytes: 1 %
Lymphocytes Relative: 18 %
Lymphs Abs: 2 K/uL (ref 0.7–4.0)
MCH: 30 pg (ref 26.0–34.0)
MCHC: 31.2 g/dL (ref 30.0–36.0)
MCV: 96.2 fL (ref 80.0–100.0)
Monocytes Absolute: 0.7 K/uL (ref 0.1–1.0)
Monocytes Relative: 7 %
Neutro Abs: 7.4 K/uL (ref 1.7–7.7)
Neutrophils Relative %: 67 %
Platelets: 528 K/uL — ABNORMAL HIGH (ref 150–400)
RBC: 3.9 MIL/uL (ref 3.87–5.11)
RDW: 15.7 % — ABNORMAL HIGH (ref 11.5–15.5)
WBC: 10.8 K/uL — ABNORMAL HIGH (ref 4.0–10.5)
nRBC: 0 % (ref 0.0–0.2)

## 2024-01-19 LAB — BASIC METABOLIC PANEL WITH GFR
Anion gap: 9 (ref 5–15)
BUN: 15 mg/dL (ref 8–23)
CO2: 29 mmol/L (ref 22–32)
Calcium: 8.6 mg/dL — ABNORMAL LOW (ref 8.9–10.3)
Chloride: 100 mmol/L (ref 98–111)
Creatinine, Ser: 0.51 mg/dL (ref 0.44–1.00)
GFR, Estimated: >60 mL/min (ref >60)
Glucose, Bld: 84 mg/dL (ref 70–99)
Potassium: 3.6 mmol/L (ref 3.5–5.1)
Sodium: 138 mmol/L (ref 135–145)

## 2024-01-19 LAB — MINIMUM INHIBITORY CONC. (1 DRUG)

## 2024-01-19 LAB — CK: Total CK: 28 U/L — ABNORMAL LOW (ref 38–234)

## 2024-01-19 LAB — MIC RESULT

## 2024-01-19 MED ORDER — SODIUM CHLORIDE 0.9% FLUSH: 10.0000 mL | INTRAVENOUS | Status: DC | PRN

## 2024-01-19 MED ORDER — CHLORHEXIDINE GLUCONATE CLOTH 2 % EX PADS
6.0000 | MEDICATED_PAD | Freq: Every day | CUTANEOUS | Status: DC
  Administered 2024-01-19 – 2024-01-22 (×4): 6 via TOPICAL

## 2024-01-19 MED ORDER — SODIUM CHLORIDE 0.9% FLUSH
10.0000 mL | Freq: Two times a day (BID) | INTRAVENOUS | Status: DC
  Administered 2024-01-19: 10 mL
  Administered 2024-01-19: 40 mL
  Administered 2024-01-20 – 2024-01-22 (×5): 10 mL

## 2024-01-19 NOTE — Progress Notes (Signed)
 Peripherally Inserted Central Catheter Placement  The IV Nurse has discussed with the patient and/or persons authorized to consent for the patient, the purpose of this procedure and the potential benefits and risks involved with this procedure.  The benefits include less needle sticks, lab draws from the catheter, and the patient may be discharged home with the catheter. Risks include, but not limited to, infection, bleeding, blood clot (thrombus formation), and puncture of an artery; nerve damage and irregular heartbeat and possibility to perform a PICC exchange if needed/ordered by physician.  Alternatives to this procedure were also discussed.  Bard Power PICC patient education guide, fact sheet on infection prevention and patient information card has been provided to patient /or left at bedside.    PICC Placement Documentation  PICC Single Lumen 01/19/24 Right Basilic 35 cm 0 cm (Active)  Indication for Insertion or Continuance of Line Home intravenous therapies (PICC only) 01/19/24 1038  Exposed Catheter (cm) 0 cm 01/19/24 1038  Site Assessment Clean, Dry, Intact 01/19/24 1038  Line Status Saline locked;Blood return noted 01/19/24 1038  Dressing Type Transparent;Securing device 01/19/24 1038  Dressing Status Antimicrobial disc/dressing in place;Clean, Dry, Intact 01/19/24 1038  Line Care Connections checked and tightened 01/19/24 1038  Line Adjustment (NICU/IV Team Only) No 01/19/24 1038  Dressing Intervention New dressing 01/19/24 1038  Dressing Change Due 01/26/24 01/19/24 1038       Matthias Merle Chenice 01/19/2024, 10:38 AM

## 2024-01-19 NOTE — NC FL2 (Signed)
 [place]  MEDICAID FL2 LEVEL OF CARE FORM     IDENTIFICATION  Patient Name: Diane Chambers Birthdate: November 02, 1933 Sex: female Admission Date (Current Location): 01/14/2024  [facility] and [place] Number:  Chiropodist and Address:  [facility], [address] [place], [place] [address]      Provider Number: [id number]  Attending Physician Name and Address:  Dorinda Drue DASEN, MD  Relative Name and Phone Number:       Current Level of Care: Hospital Recommended Level of Care: Skilled Nursing Facility Prior Approval Number:    Date Approved/Denied:   PASRR Number: [id number]  Discharge Plan: SNF    Current Diagnoses: Patient Active Problem List   Diagnosis Date Noted   Endocarditis 01/17/2024   Atrial fibrillation with RVR (HCC) 01/16/2024   Pneumonia due to human metapneumovirus 01/16/2024   MRSA bacteremia 01/16/2024   Acute bronchitis 01/14/2024   Sepsis (HCC) 01/14/2024   HLD (hyperlipidemia) 01/14/2024   Chronic diastolic CHF (congestive heart failure) (HCC) 01/14/2024   Hypothyroidism 01/14/2024   Myocardial injury 01/14/2024   Chronic atrial fibrillation with RVR (HCC) 01/14/2024   Bacteremia 01/14/2024   OA (osteoarthritis) of knee 01/29/2018   Colitis 12/16/2016   Confusion 12/29/2014   TIA (transient ischemic attack) 12/29/2014   Essential hypertension 12/29/2014   Hyponatremia 12/29/2014    Orientation RESPIRATION BLADDER Height & Weight     Self, Situation, Place  Normal Incontinent Weight: 113 lb 8.6 oz (51.5 kg) Height:  5' 6.5 (168.9 cm)  BEHAVIORAL SYMPTOMS/MOOD NEUROLOGICAL BOWEL NUTRITION STATUS   (None)  (None) Incontinent Diet (Heart healthy)  AMBULATORY STATUS COMMUNICATION OF NEEDS Skin   Limited Assist Verbally Bruising, Other (Comment) (Erythema/redness, rash.)                       Personal Care Assistance Level of Assistance  Bathing, Feeding, Dressing Bathing Assistance: Maximum  assistance Feeding assistance: Limited assistance Dressing Assistance: Maximum assistance     Functional Limitations Info  Sight, Hearing, Speech Sight Info: Adequate Hearing Info: Adequate Speech Info: Adequate    SPECIAL CARE FACTORS FREQUENCY  PT (By licensed PT), OT (By licensed OT)     PT Frequency: 5 x week OT Frequency: 5 x week            Contractures Contractures Info: Not present    Additional Factors Info  Code Status, Allergies, Isolation Precautions Code Status Info: DNR Allergies Info: Calcium -containing compounds, Lactose Intolerance (gi)     Isolation Precautions Info: Droplet/contact     Current Medications (01/19/2024):  This is the current hospital active medication list Current Facility-Administered Medications  Medication Dose Route Frequency Provider Last Rate Last Admin   albuterol  (PROVENTIL ) (2.5 MG/3ML) 0.083% nebulizer solution 3 mL  3 mL Inhalation Q4H PRN Niu, Xilin, MD   3 mL at 01/17/24 1218   apixaban  (ELIQUIS ) tablet 2.5 mg  2.5 mg Oral BID Niu, Xilin, MD   2.5 mg at 01/19/24 1059   ascorbic acid  (VITAMIN C ) tablet 500 mg  500 mg Oral Daily Niu, Xilin, MD   500 mg at 01/19/24 1059   atorvastatin  (LIPITOR) tablet 20 mg  20 mg Oral Daily Niu, Xilin, MD   20 mg at 01/19/24 1059   B-complex with vitamin C  tablet 1 tablet  1 tablet Oral Daily Niu, Xilin, MD   1 tablet at 01/19/24 1058   Chlorhexidine  Gluconate Cloth 2 % PADS 6 each  6 each Topical Daily Djan,  Drue DASEN, MD   6 each at 01/19/24 1100   cholecalciferol  (VITAMIN D3) 25 MCG (1000 UNIT) tablet 1,000 Units  1,000 Units Oral Daily Niu, Xilin, MD   1,000 Units at 01/19/24 1059   DAPTOmycin  (CUBICIN ) 400 mg in sodium chloride  0.9 % IVPB  8 mg/kg Intravenous Q1400 Fayette Bodily, MD   Stopped at 01/19/24 1349   dextromethorphan-guaiFENesin  (MUCINEX  DM) 30-600 MG per 12 hr tablet 1 tablet  1 tablet Oral BID PRN Niu, Xilin, MD       doxycycline  (VIBRA -TABS) tablet 100 mg  100 mg Oral  Q12H Fayette Bodily, MD   100 mg at 01/19/24 1059   empagliflozin  (JARDIANCE ) tablet 10 mg  10 mg Oral Daily Niu, Xilin, MD   10 mg at 01/19/24 1058   furosemide  (LASIX ) tablet 40 mg  40 mg Oral Daily Niu, Xilin, MD   40 mg at 01/19/24 1059   gabapentin  (NEURONTIN ) capsule 100 mg  100 mg Oral TID Niu, Xilin, MD   100 mg at 01/19/24 1059   hydrOXYzine  (ATARAX ) tablet 10 mg  10 mg Oral Q8H PRN Niu, Xilin, MD       ipratropium-albuterol  (DUONEB) 0.5-2.5 (3) MG/3ML nebulizer solution 3 mL  3 mL Nebulization BID Fausto Sor A, DO   3 mL at 01/19/24 9163   levothyroxine  (SYNTHROID ) tablet 137 mcg  137 mcg Oral q AM Niu, Xilin, MD   137 mcg at 01/19/24 9482   loratadine  (CLARITIN ) tablet 10 mg  10 mg Oral Daily Niu, Xilin, MD   10 mg at 01/19/24 1059   metoprolol  tartrate (LOPRESSOR ) tablet 50 mg  50 mg Oral BID Fausto Sor A, DO   50 mg at 01/19/24 1058   multivitamin with minerals tablet 1 tablet  1 tablet Oral Daily Niu, Xilin, MD   1 tablet at 01/19/24 1100   Oral care mouth rinse  15 mL Mouth Rinse PRN Niu, Xilin, MD       sertraline  (ZOLOFT ) tablet 25 mg  25 mg Oral Daily Niu, Xilin, MD   25 mg at 01/19/24 1058   sodium chloride  flush (NS) 0.9 % injection 10-40 mL  10-40 mL Intracatheter Q12H Dorinda Drue T, MD   10 mL at 01/19/24 1102   sodium chloride  flush (NS) 0.9 % injection 10-40 mL  10-40 mL Intracatheter PRN Dorinda Drue DASEN, MD       traZODone  (DESYREL ) tablet 100 mg  100 mg Oral QHS Niu, Xilin, MD   100 mg at 01/18/24 2014   triamcinolone  ointment (KENALOG ) 0.1 % 1 Application  1 Application Topical QHS Niu, Xilin, MD   1 Application at 01/18/24 2014     Discharge Medications: Please see discharge summary for a list of discharge medications.  Relevant Imaging Results:  Relevant Lab Results:   Additional Information SS#: [id number]  Lauraine JAYSON Carpen, LCSW

## 2024-01-19 NOTE — Plan of Care (Signed)

## 2024-01-19 NOTE — TOC Progression Note (Signed)
 Transition of Care [facility] [place]) - Progression Note    Patient Details  Name: Diane Chambers MRN: [id number] Date of Birth: 07/23/1933  [facility] CM/SW Contact  Alvaro Louder, [place] Phone Number: 01/19/2024, 9:37 AM  Clinical Narrative:   LCSWA spoke to patients son over the phone. Son wanted to know process of SNF placement and where the patient would be going at discharge. LCSWA educated son on process of SNF placement and let him know that the [facility] can [facility] today when she is medically ready. Son was in agreement and thankful.   TOC to follow for discharge                      Expected Discharge Plan and Services                                               Social Drivers of Health (SDOH) Interventions SDOH Screenings   Food Insecurity: No Food Insecurity (01/16/2024)  Housing: Unknown (01/16/2024)  Transportation Needs: No Transportation Needs (01/16/2024)  Tobacco Use: Medium Risk (01/16/2024)    Readmission Risk Interventions     No data to display

## 2024-01-19 NOTE — Progress Notes (Signed)
 Physical Therapy Treatment Patient Details Name: Diane Chambers MRN: [id number] DOB: 1933-12-09 Today's Date: 01/19/2024   History of Present Illness Pt is an 89 y.o. female with medical history significant of A fib on Eliquis , dCHF, HTN, HLD, multiple stroke with left arm weakness, vascular dementia, hypothyroidism, colitis, carotid artery stenosis, who presents with SOB. MD assessment includes: severe sepsis due to acute bronchitis and possible staph bacteremia, chronic atrial fibrillation with RVR, and myocardial injury with troponin 35, likely demand ischemia.    PT Comments  Patient received in bed with NT giving bath. She is agreeable to ambulate after bath. Patient requires min A for bed mobility, cga for sit to stand and cga for ambulation with RW ~ 35 feet. Patient is fatigued with this distance. She will continue to benefit from skilled PT to improve strength and independence with mobility.      If plan is discharge home, recommend the following: A little help with walking and/or transfers;A little help with bathing/dressing/bathroom;Assist for transportation;Supervision due to cognitive status;Direct supervision/assist for medications management;Direct supervision/assist for financial management   Can travel by private vehicle     Yes  Equipment Recommendations       Recommendations for Other Services       Precautions / Restrictions Precautions Precautions: Fall Recall of Precautions/Restrictions: Impaired Restrictions Weight Bearing Restrictions Per Provider Order: No     Mobility  Bed Mobility Overal bed mobility: (P) Needs Assistance Bed Mobility: (P) Rolling, Sidelying to Sit, Sit to Sidelying Rolling: (P) Min assist Sidelying to sit: (P) Min assist     Sit to sidelying: (P) Min assist General bed mobility comments: (P) tactile/verbal cues required for sequencing and technique    Transfers Overall transfer level: (P) Needs assistance Equipment used: (P) Rolling  walker (2 wheels) Transfers: (P) Sit to/from Stand Sit to Stand: (P) Contact guard assist                Ambulation/Gait Ambulation/Gait assistance: (P) Contact guard assist Gait Distance (Feet): 35 Feet Assistive device: Rolling walker (2 wheels) Gait Pattern/deviations: Step-through pattern, Decreased step length - right, Decreased step length - left, Decreased stride length Gait velocity: decreased     General Gait Details: Patient ambulated with RW HR in Afib throughout session, up to 130s. CGA. Quite fatigued with this short distance.   Stairs             Wheelchair Mobility     Tilt Bed    Modified Rankin (Stroke Patients Only)       Balance Overall balance assessment: Needs assistance Sitting-balance support: Feet supported Sitting balance-Leahy Scale: Good     Standing balance support: Bilateral upper extremity supported, During functional activity, Reliant on assistive device for balance Standing balance-Leahy Scale: Fair                              Hotel manager: Impaired Factors Affecting Communication: Hearing impaired  Cognition Arousal: Alert Behavior During Therapy: WFL for tasks assessed/performed   PT - Cognitive impairments: No apparent impairments   Orientation impairments: Person, Place                     Following commands: Intact Following commands impaired: Follows one step commands with increased time    Cueing Cueing Techniques: Verbal cues, Gestural cues  Exercises      General Comments        Pertinent Vitals/Pain Pain  Assessment Pain Assessment: Faces Faces Pain Scale: Hurts a little bit Pain Location: bottom is sore and red Pain Descriptors / Indicators: Discomfort, Sore    Home Living                          Prior Function            PT Goals (current goals can now be found in the care plan section) Acute Rehab PT Goals Patient Stated  Goal: To walk better PT Goal Formulation: With patient Time For Goal Achievement: 01/29/24 Potential to Achieve Goals: Good Progress towards PT goals: Progressing toward goals    Frequency    Min 2X/week      PT Plan      Co-evaluation     PT goals addressed during session: Mobility/safety with mobility;Balance        AM-PAC PT 6 Clicks Mobility   Outcome Measure  Help needed turning from your back to your side while in a flat bed without using bedrails?: A Little Help needed moving from lying on your back to sitting on the side of a flat bed without using bedrails?: A Little Help needed moving to and from a bed to a chair (including a wheelchair)?: A Little Help needed standing up from a chair using your arms (e.g., wheelchair or bedside chair)?: A Little Help needed to walk in hospital room?: A Little Help needed climbing 3-5 steps with a railing? : A Lot 6 Click Score: 17    End of Session Equipment Utilized During Treatment: Gait belt Activity Tolerance: Patient limited by fatigue Patient left: in bed;with call bell/phone within reach;with nursing/sitter in room Nurse Communication: Mobility status PT Visit Diagnosis: Muscle weakness (generalized) (M62.81);Other abnormalities of gait and mobility (R26.89);Hemiplegia and hemiparesis;Difficulty in walking, not elsewhere classified (R26.2);Unsteadiness on feet (R26.81) Hemiplegia - Right/Left: Left Hemiplegia - dominant/non-dominant: Non-dominant Hemiplegia - caused by: Cerebral infarction     Time: 9056-8994 PT Time Calculation (min) (ACUTE ONLY): 22 min  Charges:    $Gait Training: 8-22 mins PT General Charges $$ ACUTE PT VISIT: 1 Visit                     Maxten Shuler, PT, GCS 01/19/24,10:21 AM

## 2024-01-19 NOTE — Plan of Care (Signed)
  Problem: Education: Goal: Knowledge of General Education information will improve Description: Including pain rating scale, medication(s)/side effects and non-pharmacologic comfort measures Outcome: Progressing   Problem: Health Behavior/Discharge Planning: Goal: Ability to manage health-related needs will improve Outcome: Progressing   Problem: Activity: Goal: Risk for activity intolerance will decrease Outcome: Progressing   Problem: Nutrition: Goal: Adequate nutrition will be maintained Outcome: Progressing   Problem: Coping: Goal: Level of anxiety will decrease Outcome: Progressing   Problem: Elimination: Goal: Will not experience complications related to bowel motility Outcome: Progressing Goal: Will not experience complications related to urinary retention Outcome: Progressing   Problem: Pain Managment: Goal: General experience of comfort will improve and/or be controlled Outcome: Progressing   Problem: Safety: Goal: Ability to remain free from injury will improve Outcome: Progressing   Problem: Skin Integrity: Goal: Risk for impaired skin integrity will decrease Outcome: Progressing   Problem: Activity: Goal: Capacity to carry out activities will improve Outcome: Progressing   Problem: Cardiac: Goal: Ability to achieve and maintain adequate cardiopulmonary perfusion will improve Outcome: Progressing   Problem: Respiratory: Goal: Ability to maintain adequate ventilation will improve Outcome: Progressing Goal: Ability to maintain a clear airway will improve Outcome: Progressing

## 2024-01-19 NOTE — Progress Notes (Signed)
 Progress Note   Patient: Diane Chambers FMW:[id number] DOB: 1933-08-02 DOA: 01/14/2024     5 DOS: the patient was seen and examined on 01/19/2024     Brief hospital course:   HPI on admission: Diane Chambers is a 89 y.o. female with medical history significant of A fib on Eliquis , dCHF, HTN, HLD, multiple stroke with left arm weakness, vascular dementia, hypothyroidism, colitis, carotid artery stenosis, who presents with SOB.   Per patient and her son at the bedside, patient has generalized weakness and decreased appetite and oral intake for more than 3 days.  Since this morning, patient developed SOB, which has been progressively worsening.  Patient has dry cough, no chest pain. Patient has fever and chills.  Her temperature is 100.7 in ED.  Per report, patient has oxygen desaturation to 87% in facility.  He is oxygen saturation is 94% in the ED.  Her son states that patient's heart rate was up to 175 earlier.  Her heart rate is 140-160 in ED.... See H&P for full HPI on admission & ED course.   Patient was admitted for further evaluation and management of acute bronchitis complicated by A-fib with RVR requiring Cardizem  infusion for heart rate control.  Pt was started on Rocephin  and Zithromax .   On next hospital day, blood cultures positive for staph, as well as MRSA positive nares PCR screen.  Vancomycin  was added and Infectious disease consulted.   Further hospital course and management as outlined below.     Assessment and Plan:   Severe Sepsis due to Staph bacteremia, acute bronchitis, metapneumovirus infection Pt met criteria for severe sepsis on admission with fever 100.7 and tachycardia with A-fib with RVR.  Lactic acid elevated 2.6-->  1.6.  +Blood cultures Continue daptomycin  We will follow infectious disease recommendation concerning discharge antibiotics --Patient underwent TEE on 01/17/2024 that was normal --Follow sputum cultures - RT to induce sputum if needed --Follow blood  cultures & obtain repeats --IS, flutter for pulmonary hygiene TOC working on placement   Chronic atrial fibrillation with RVR  --Continue Eliquis  --Increased home metoprolol  25 mg daily to 50 mg BID (Unclear reason for once daily dosing for metoprolol  tartrate as is home dose) --Has been weaned off Cardizem  drip --Telemetry --Maintain K>4, Mg>2 --Mgmt of acute illness above, precipitating RVR    Chronic diastolic CHF (congestive heart failure) (HCC): 2D echo on 12/30/2014 showed EF of 55 to 60% with grade 1 diastolic dysfunction.  Patient has had elevated BNP of 293.   Echo this admission -- EF 65-70%, indeterminate diastolic paramters, mild-mod MR, mild-mod TR, mild AR Continue Lasix  --Monitor volume status, renal function & electrolytes   Myocardial injury: Troponin 35 --> 34, likely demand ischemia due to Afib RVR and sepsis --deferred aspirin  since patient is on Eliquis  Continue Lipitor   Hypothyroidism: TSH and T4 normal -Continue Synthroid  175 mcg daily   TIA (transient ischemic attack) -On Eliquis  and Lipitor   Essential hypertension - IV hydralazine as needed - Metoprolol    HLD (hyperlipidemia) - Lipitor     Subjective:  Seen and examined at bedside this morning Had PICC line placed today Currently awaiting placement   Physical Exam:   General exam: awake, alert, no acute distress HEENT: moist mucus membranes, hearing grossly normal  Respiratory system: CTAB, rhonchi vs upper airway secretion sounds, normal respiratory effort at rest on room air. Cardiovascular system: normal S1/S2, RRR, no pedal edema.   Gastrointestinal system: soft, NT, ND, +bowel sounds. Central nervous system: A&O  x2+. no gross focal neurologic deficits, normal speech Skin: dry, intact, normal temperature Psychiatry: normal mood, congruent affect, judgement and insight appear normal       Family Communication: Discussed with patient's son at bedside Disposition: Status is:  Inpatient Remains inpatient appropriate because: remains on IV therapies as above, ongoing evaluation for bacteremia with TEE planned.      Planned Discharge Destination: Home   Data Reviewed:  Vitals:   01/19/24 0500 01/19/24 0820 01/19/24 1220 01/19/24 1531  BP:  (!) 134/93 (!) 142/94   Pulse:      Resp:      Temp:  97.7 F (36.5 C) 98 F (36.7 C) 97.8 F (36.6 C)  TempSrc:  Oral Oral Oral  SpO2:      Weight: 51.5 kg     Height:          Latest Ref Rng & Units 01/19/2024    6:52 AM 01/18/2024    6:35 AM 01/17/2024    6:29 AM  CBC  WBC 4.0 - 10.5 K/uL 10.8  7.1  7.4   Hemoglobin 12.0 - 15.0 g/dL 88.2  88.7  89.4   Hematocrit 36.0 - 46.0 % 37.5  34.8  33.1   Platelets 150 - 400 K/uL 528  468  419        Latest Ref Rng & Units 01/19/2024    6:52 AM 01/18/2024    6:35 AM 01/17/2024    6:29 AM  BMP  Glucose 70 - 99 mg/dL 84  87  883   BUN 8 - 23 mg/dL 15  20  20    Creatinine 0.44 - 1.00 mg/dL 9.48  9.47  9.46   Sodium 135 - 145 mmol/L 138  138  139   Potassium 3.5 - 5.1 mmol/L 3.6  3.7  3.7   Chloride 98 - 111 mmol/L 100  105  107   CO2 22 - 32 mmol/L 29  28  27    Calcium  8.9 - 10.3 mg/dL 8.6  8.7  8.8      Author: Drue ONEIDA Potter, MD 01/19/2024 5:05 PM  For on call review [email or web address].

## 2024-01-19 NOTE — Progress Notes (Signed)
 Date of Admission:  01/14/2024   T  ID: BRISIA SCHUERMANN is a 89 y.o. female  Principal Problem:   Acute bronchitis Active Problems:   TIA (transient ischemic attack)   Essential hypertension   Sepsis (HCC)   HLD (hyperlipidemia)   Chronic diastolic CHF (congestive heart failure) (HCC)   Hypothyroidism   Myocardial injury   Chronic atrial fibrillation with RVR (HCC)   Bacteremia   Atrial fibrillation with RVR (HCC)   Pneumonia due to human metapneumovirus   MRSA bacteremia   Endocarditis  89 y.o. female with a history of Afib on eliquis , HTN, HLD, CVA, CHF, left TKA, laminectomy presents from SNF  with cough and shorthess of breath of 3 days duration. She also has decreased appetite and generalized weakness She has fever and chills. I spoke to her son [name] and then asked [facility] to [facility] her discharge summary and I reviewed that  Pt used to live in [place] . In June 2025 /Juy 2025 she was admitted to [facility] and [facility] due to severe case of crusted scabies with hypereosinophilia  , she had multiple  strokes She was treated for scabies and then came to [facility] for Rehab at the end of July 2025. She has h/o afib and had undergone cardioversion at one time  Subjective:  patient is feeling better Cough is better Still not able to bring up any phlegm Medications:   apixaban   2.5 mg Oral BID   ascorbic acid   500 mg Oral Daily   atorvastatin   20 mg Oral Daily   B-complex with vitamin C   1 tablet Oral Daily   Chlorhexidine  Gluconate Cloth  6 each Topical Daily   cholecalciferol   1,000 Units Oral Daily   doxycycline   100 mg Oral Q12H   empagliflozin   10 mg Oral Daily   furosemide   40 mg Oral Daily   gabapentin   100 mg Oral TID   ipratropium-albuterol   3 mL Nebulization BID   levothyroxine   137 mcg Oral q AM   loratadine   10 mg Oral Daily   metoprolol  tartrate  50 mg Oral BID   multivitamin with minerals  1  tablet Oral Daily   sertraline   25 mg Oral Daily   sodium chloride  flush  10-40 mL Intracatheter Q12H   traZODone   100 mg Oral QHS   triamcinolone  ointment  1 Application Topical QHS    Objective: Vital signs in last 24 hours: Patient Vitals for the past 24 hrs:  BP Temp Temp src Resp SpO2 Weight  01/19/24 0820 (!) 134/93 97.7 F (36.5 C) Oral -- -- --  01/19/24 0500 -- -- -- -- -- 51.5 kg  01/19/24 0413 -- 97.7 F (36.5 C) -- -- -- --  01/18/24 2338 -- 97.9 F (36.6 C) -- -- -- --  01/18/24 2015 -- -- -- -- 96 % --  01/18/24 2010 -- 98.2 F (36.8 C) -- 20 -- --  01/18/24 1500 -- 97.9 F (36.6 C) Oral 16 -- --  PICC line in place PHYSICAL EXAM:  General: Alert, cooperative, no distress, appears stated age.  Lungs: b/l air entry  Heart:irregular. Abdomen: Soft, non-tender,not distended. Bowel sounds normal. No masses Extremities: b/l knee surgical scars Skin: no lesions Rt cheek verrucous lesion Lymph: Cervical, supraclavicular normal. Neurologic: Grossly non-focal  Lab Results    Latest Ref Rng & Units 01/19/2024    6:52 AM 01/18/2024    6:35 AM 01/17/2024  6:29 AM  CBC  WBC 4.0 - 10.5 K/uL 10.8  7.1  7.4   Hemoglobin 12.0 - 15.0 g/dL 88.2  88.7  89.4   Hematocrit 36.0 - 46.0 % 37.5  34.8  33.1   Platelets 150 - 400 K/uL 528  468  419        Latest Ref Rng & Units 01/19/2024    6:52 AM 01/18/2024    6:35 AM 01/17/2024    6:29 AM  CMP  Glucose 70 - 99 mg/dL 84  87  883   BUN 8 - 23 mg/dL 15  20  20    Creatinine 0.44 - 1.00 mg/dL 9.48  9.47  9.46   Sodium 135 - 145 mmol/L 138  138  139   Potassium 3.5 - 5.1 mmol/L 3.6  3.7  3.7   Chloride 98 - 111 mmol/L 100  105  107   CO2 22 - 32 mmol/L 29  28  27    Calcium  8.9 - 10.3 mg/dL 8.6  8.7  8.8       Microbiology: 8/24 -  BC both sets 1 bottle each has MRSA 8/24 MRSA nares positive 8/26 BC Sent Studies/Results: US  EKG SITE RITE Result Date: 01/18/2024 If Site Rite image not attached, placement could not be  confirmed due to current cardiac rhythm.  ECHO TEE Result Date: 01/17/2024    TRANSESOPHOGEAL ECHO REPORT   Patient Name:   ROSHANA SHUFFIELD Date of Exam: 01/17/2024 Medical Rec #:  [id number]    Height:       66.5 in Accession #:    [id number]   Weight:       111.3 lb Date of Birth:  17-Apr-1934     BSA:          1.568 m Patient Age:    89 years     BP:           140/75 mmHg Patient Gender: F            HR:           95 bpm. Exam Location:  [facility] Procedure: Transesophageal Echo, Color Doppler and Saline Contrast Bubble Study            (Both Spectral and Color Flow Doppler were utilized during            procedure). Indications:     Bacteremia  History:         Patient has prior history of Echocardiogram examinations, most                  recent 01/15/2024.  Sonographer:     Ashley McNeely-Sloane Referring Phys:  [id number] [facility] CARE[facility] Diagnosing Phys: Evalene Lunger MD PROCEDURE: After discussion of the risks and benefits of a TEE, an informed consent was obtained from the patient. TEE procedure time was 30 minutes. The transesophogeal probe was passed without difficulty through the esophogus of the patient. Imaged were obtained with the patient in a left lateral decubitus position. Sedation performed by performing physician. Patients was under conscious sedation during this procedure. Anesthetic administered: 75mcg of Fentanyl , 1.5mg  of Versed . Image quality was excellent. The patient's vital signs; including heart rate, blood pressure, and oxygen saturation; remained stable throughout the procedure. The patient developed no complications during the procedure.  IMPRESSIONS  1. Left ventricular ejection fraction, by estimation, is 55 to 60%. The left ventricle has normal function. The left ventricle has no regional wall motion abnormalities. Left ventricular diastolic  parameters are indeterminate.  2. Right ventricular systolic function is normal. The right ventricular size is normal.  3. No left atrial/left atrial  appendage thrombus was detected.  4. The mitral valve is normal in structure. Mild to moderate mitral valve regurgitation. No evidence of mitral stenosis.  5. The aortic valve is normal in structure. Aortic valve regurgitation is trivial. No aortic stenosis is present.  6. The inferior vena cava is normal in size with greater than 50% respiratory variability, suggesting right atrial pressure of 3 mmHg.  7. Agitated saline contrast bubble study was negative, with no evidence of any interatrial shunt.  8. Conclusion(s)/Recommendation(s): Normal biventricular function without evidence of hemodynamically significant valvular heart disease. FINDINGS  Left Ventricle: Left ventricular ejection fraction, by estimation, is 55 to 60%. The left ventricle has normal function. The left ventricle has no regional wall motion abnormalities. The left ventricular internal cavity size was normal in size. There is  no left ventricular hypertrophy. Left ventricular diastolic parameters are indeterminate. Right Ventricle: The right ventricular size is normal. No increase in right ventricular wall thickness. Right ventricular systolic function is normal. Left Atrium: Left atrial size was normal in size. No left atrial/left atrial appendage thrombus was detected. Right Atrium: Right atrial size was normal in size. Pericardium: There is no evidence of pericardial effusion. Mitral Valve: The mitral valve is normal in structure. Mild to moderate mitral valve regurgitation. No evidence of mitral valve stenosis. There is no evidence of mitral valve vegetation. Tricuspid Valve: The tricuspid valve is normal in structure. Tricuspid valve regurgitation is mild . No evidence of tricuspid stenosis. There is no evidence of tricuspid valve vegetation. Aortic Valve: The aortic valve is normal in structure. Aortic valve regurgitation is trivial. No aortic stenosis is present. There is no evidence of aortic valve vegetation. Pulmonic Valve: The pulmonic  valve was normal in structure. Pulmonic valve regurgitation is not visualized. No evidence of pulmonic stenosis. There is no evidence of pulmonic valve vegetation. Aorta: The aortic root is normal in size and structure. Venous: The inferior vena cava is normal in size with greater than 50% respiratory variability, suggesting right atrial pressure of 3 mmHg. IAS/Shunts: No atrial level shunt detected by color flow Doppler. Agitated saline contrast was given intravenously to evaluate for intracardiac shunting. Agitated saline contrast bubble study was negative, with no evidence of any interatrial shunt. There  is no evidence of a patent foramen ovale. There is no evidence of an atrial septal defect. Additional Comments: 3D was performed not requiring image post processing on an independent workstation and was indeterminate. Timothy Gollan MD Electronically signed by Evalene Lunger MD Signature Date/Time: 01/17/2024/2:07:50 PM    Final (Updated)      Assessment/Plan: MRSA bacteremia Repeat blood culture Neg so far TEE Neg from 8/27 No pain in the spine B/l TKA sites are fine I would presume that MRSA bacteremia was secondary to metapneumovirus  bronchitis/pneumonia   Vancomycin  was switched to daptomycin .  Vancomycin  high risk for kidney injury in elderly and needs close monitoring of levels  With dapto need to check weekly CK, cbc, cmp and observe closely for eosinophilia or rhabdo Will do doxy 100mg  PO BID for lung infection for 2 week total  We need to make sure that SNF has daptomycin  on hand and can administer without any delay as it will be the weekend   OPAT orders placed  Meta pneumovirus  bronchitis and lower resp infection   AFIB with RVR on metoprolol  and eliquis   Anemia  H/o CHF  H/o crusted scabies treated in [place]  H/o CVA- some residual left sided weakness  Vascular dementia  B/l TKA Hypothyroidism- on synthroid   Discussed with patient, and son in detail Discussed with  Dr.Djan Will follow her as OP

## 2024-01-19 NOTE — TOC Progression Note (Signed)
 Transition of Care [facility]) - Progression Note    Patient Details  Name: Diane Chambers MRN: [id number] Date of Birth: 1934-04-14  Transition of Care [facility]) CM/SW Contact  Lauraine JAYSON Carpen, LCSW Phone Number: 01/19/2024, 5:05 PM  Clinical Narrative:  [facility] admissions coordinator said they would not have a bed until Sunday. They will get IV abx from their backup pharmacy.   Expected Discharge Plan and Services                                               Social Drivers of Health (SDOH) Interventions SDOH Screenings   Food Insecurity: No Food Insecurity (01/16/2024)  Housing: Unknown (01/16/2024)  Transportation Needs: No Transportation Needs (01/16/2024)  Tobacco Use: Medium Risk (01/16/2024)    Readmission Risk Interventions     No data to display

## 2024-01-19 NOTE — Progress Notes (Signed)
 PHARMACY CONSULT NOTE FOR:  OUTPATIENT  PARENTERAL ANTIBIOTIC THERAPY (OPAT)  Indication: MRSA bacteremia Regimen: Daptomycin  8mg /kg body weight = 400mg  IV every 24 hours  End date: 02/13/24  *Also to receive Doxycycline  100mg  PO BID until 01/28/2024  [facility] Care Per Protocol   Labs weekly while on IV antibiotics: _X_ CBC with differential _X_ CK _X_ CMP Fax weekly lab results  promptly to [phone] and [phone]    While on IV daptomycin  watch for eosinophilia, rhabdomyolysis ( increase in CK and pneumonia   Call [phone] with critical values or questions    _X_ Please pull PIC at completion of IV antibiotics  IV antibiotic discharge orders are pended. To discharging provider:  please sign these orders via discharge navigator,  Select New Orders & click on the button choice - Manage This Unsigned Work.     Thank you for allowing pharmacy to be a part of this patient's care.  Glendy Barsanti, PharmD, BCPS, BCIDP Work Cell: [phone] 01/19/2024 3:32 PM

## 2024-01-20 DIAGNOSIS — R7881 Bacteremia: Secondary | ICD-10-CM | POA: Diagnosis not present

## 2024-01-20 DIAGNOSIS — B9562 Methicillin resistant Staphylococcus aureus infection as the cause of diseases classified elsewhere: Secondary | ICD-10-CM | POA: Diagnosis not present

## 2024-01-20 LAB — CBC WITH DIFFERENTIAL/PLATELET
Abs Immature Granulocytes: 0.07 K/uL (ref 0.00–0.07)
Basophils Absolute: 0 K/uL (ref 0.0–0.1)
Basophils Relative: 0 %
Eosinophils Absolute: 0.9 K/uL — ABNORMAL HIGH (ref 0.0–0.5)
Eosinophils Relative: 10 %
HCT: 34.1 % — ABNORMAL LOW (ref 36.0–46.0)
Hemoglobin: 11 g/dL — ABNORMAL LOW (ref 12.0–15.0)
Immature Granulocytes: 1 %
Lymphocytes Relative: 23 %
Lymphs Abs: 2.2 K/uL (ref 0.7–4.0)
MCH: 30.1 pg (ref 26.0–34.0)
MCHC: 32.3 g/dL (ref 30.0–36.0)
MCV: 93.2 fL (ref 80.0–100.0)
Monocytes Absolute: 0.8 K/uL (ref 0.1–1.0)
Monocytes Relative: 8 %
Neutro Abs: 5.5 K/uL (ref 1.7–7.7)
Neutrophils Relative %: 58 %
Platelets: 500 K/uL — ABNORMAL HIGH (ref 150–400)
RBC: 3.66 MIL/uL — ABNORMAL LOW (ref 3.87–5.11)
RDW: 15.5 % (ref 11.5–15.5)
WBC: 9.5 K/uL (ref 4.0–10.5)
nRBC: 0 % (ref 0.0–0.2)

## 2024-01-20 LAB — BASIC METABOLIC PANEL WITH GFR
Anion gap: 8 (ref 5–15)
BUN: 14 mg/dL (ref 8–23)
CO2: 28 mmol/L (ref 22–32)
Calcium: 8.6 mg/dL — ABNORMAL LOW (ref 8.9–10.3)
Chloride: 101 mmol/L (ref 98–111)
Creatinine, Ser: 0.55 mg/dL (ref 0.44–1.00)
GFR, Estimated: >60 mL/min (ref >60)
Glucose, Bld: 100 mg/dL — ABNORMAL HIGH (ref 70–99)
Potassium: 3.3 mmol/L — ABNORMAL LOW (ref 3.5–5.1)
Sodium: 137 mmol/L (ref 135–145)

## 2024-01-20 MED ORDER — POTASSIUM CHLORIDE 20 MEQ PO PACK
40.0000 meq | PACK | Freq: Once | ORAL | Status: AC
  Administered 2024-01-20: 40 meq via ORAL
  Filled 2024-01-20: qty 2

## 2024-01-20 NOTE — Progress Notes (Signed)
 Physical Therapy Treatment Patient Details Name: Diane Chambers MRN: [id number] DOB: 1933/09/04 Today's Date: 01/20/2024   History of Present Illness Pt is an 89 y.o. female with medical history significant of A fib on Eliquis , dCHF, HTN, HLD, multiple stroke with left arm weakness, vascular dementia, hypothyroidism, colitis, carotid artery stenosis, who presents with SOB. MD assessment includes: severe sepsis due to acute bronchitis and possible staph bacteremia, chronic atrial fibrillation with RVR, and myocardial injury with troponin 35, likely demand ischemia.    PT Comments  Pt seen for PT tx with pt agreeable. Pt presents with decreased overall awareness & decreased safety awareness. Pt requires up to min assist for bed mobility, sit>stand with min assist with cuing re: hand placement & to scoot out to edge of seat prior to transfer. Pt ambulates to BSC>sink>Bed with RW & min assist with impaired gait pattern as noted below. Pt with poor standing balance when performing hand hygiene at sink; at one point fully leaning forward on sink/counter with decreased awareness & ability to correct. Pt notes fatigue after task. Pt declined sitting in chair 2/2 discomfort so positioned in bed in chair position. Recommend post acute rehab <3 hours therapy/day.    If plan is discharge home, recommend the following: A little help with walking and/or transfers;A little help with bathing/dressing/bathroom;Assist for transportation;Supervision due to cognitive status;Direct supervision/assist for medications management;Direct supervision/assist for financial management   Can travel by private vehicle     Yes  Equipment Recommendations  Rolling walker (2 wheels);BSC/3in1    Recommendations for Other Services       Precautions / Restrictions Precautions Precautions: Fall Restrictions Weight Bearing Restrictions Per Provider Order: No     Mobility  Bed Mobility Overal bed mobility: Needs Assistance Bed  Mobility: Supine to Sit     Supine to sit: Supervision, HOB elevated (exited R side of bed) Sit to supine: Used rails, HOB elevated, Min assist (min assist to reposition straight in bed)   General bed mobility comments: max assist to scoot to [facility]    Transfers Overall transfer level: Needs assistance   Transfers: Sit to/from Stand Sit to Stand: Min assist           General transfer comment: sit>stand from EOB, BSC, with cuing re: hand placement, assistance to push up to standing    Ambulation/Gait Ambulation/Gait assistance: Min assist Gait Distance (Feet): 5 Feet (+ 5 ft + 5 ft) Assistive device: Rolling walker (2 wheels) Gait Pattern/deviations: Decreased step length - left, Decreased step length - right, Decreased stride length Gait velocity: decreased     General Gait Details: extra time for weight shifting L<>R, decreased weight shift to LLE in stance phase, decreased foot clearance, extra time to advance LLE   Stairs             Wheelchair Mobility     Tilt Bed    Modified Rankin (Stroke Patients Only)       Balance Overall balance assessment: Needs assistance Sitting-balance support: Feet supported Sitting balance-Leahy Scale: Fair Sitting balance - Comments: close supervision for static sitting Postural control: Left lateral lean Standing balance support: Bilateral upper extremity supported, During functional activity, Reliant on assistive device for balance Standing balance-Leahy Scale: Poor                              Communication Communication Communication: Impaired Factors Affecting Communication: Hearing impaired  Cognition   Behavior During Therapy:  WFL for tasks assessed/performed   PT - Cognitive impairments: Awareness, Safety/Judgement, Problem solving                           Following commands impaired: Follows one step commands with increased time    Cueing Cueing Techniques: Verbal cues, Gestural  cues  Exercises      General Comments General comments (skin integrity, edema, etc.): continent void on BSC, small incontinent BM in underwear (pt unaware)      Pertinent Vitals/Pain Pain Assessment Pain Assessment: Faces Faces Pain Scale: Hurts a little bit Pain Location: bottom is sore and red Pain Descriptors / Indicators: Discomfort, Sore Pain Intervention(s): Monitored during session    Home Living                          Prior Function            PT Goals (current goals can now be found in the care plan section) Acute Rehab PT Goals Patient Stated Goal: To walk better PT Goal Formulation: With patient Time For Goal Achievement: 01/29/24 Potential to Achieve Goals: Good Progress towards PT goals: Progressing toward goals    Frequency    Min 2X/week      PT Plan      Co-evaluation              AM-PAC PT 6 Clicks Mobility   Outcome Measure  Help needed turning from your back to your side while in a flat bed without using bedrails?: A Little Help needed moving from lying on your back to sitting on the side of a flat bed without using bedrails?: A Little Help needed moving to and from a bed to a chair (including a wheelchair)?: A Little Help needed standing up from a chair using your arms (e.g., wheelchair or bedside chair)?: A Little Help needed to walk in hospital room?: A Little Help needed climbing 3-5 steps with a railing? : A Lot 6 Click Score: 17    End of Session Equipment Utilized During Treatment: Gait belt Activity Tolerance: Patient limited by fatigue Patient left: in bed;with call bell/phone within reach;with bed alarm set;with nursing/sitter in room (bed in chair position) Nurse Communication:  (BM & Void during session) PT Visit Diagnosis: Muscle weakness (generalized) (M62.81);Other abnormalities of gait and mobility (R26.89);Hemiplegia and hemiparesis;Difficulty in walking, not elsewhere classified (R26.2);Unsteadiness on  feet (R26.81) Hemiplegia - Right/Left: Left Hemiplegia - dominant/non-dominant: Non-dominant Hemiplegia - caused by: Cerebral infarction     Time: 8591-8567 PT Time Calculation (min) (ACUTE ONLY): 24 min  Charges:    $Therapeutic Activity: 23-37 mins PT General Charges $$ ACUTE PT VISIT: 1 Visit                     Richerd Pinal, PT, DPT 01/20/24, 2:41 PM   Richerd CHRISTELLA Pinal 01/20/2024, 2:39 PM

## 2024-01-20 NOTE — Progress Notes (Signed)
 Progress Note   Patient: Diane Chambers FMW:[id number] DOB: 13-Nov-1933 DOA: 01/14/2024     6 DOS: the patient was seen and examined on 01/20/2024    Brief hospital course:   HPI on admission: Diane Chambers is a 89 y.o. female with medical history significant of A fib on Eliquis , dCHF, HTN, HLD, multiple stroke with left arm weakness, vascular dementia, hypothyroidism, colitis, carotid artery stenosis, who presents with SOB.   Per patient and her son at the bedside, patient has generalized weakness and decreased appetite and oral intake for more than 3 days.  Since this morning, patient developed SOB, which has been progressively worsening.  Patient has dry cough, no chest pain. Patient has fever and chills.  Her temperature is 100.7 in ED.  Per report, patient has oxygen desaturation to 87% in facility.  He is oxygen saturation is 94% in the ED.  Her son states that patient's heart rate was up to 175 earlier.  Her heart rate is 140-160 in ED.... See H&P for full HPI on admission & ED course.   Patient was admitted for further evaluation and management of acute bronchitis complicated by A-fib with RVR requiring Cardizem  infusion for heart rate control.  Pt was started on Rocephin  and Zithromax .   On next hospital day, blood cultures positive for staph, as well as MRSA positive nares PCR screen.  Vancomycin  was added and Infectious disease consulted.   Further hospital course and management as outlined below.     Assessment and Plan:   Severe Sepsis due to Staph bacteremia, acute bronchitis, metapneumovirus infection Pt met criteria for severe sepsis on admission with fever 100.7 and tachycardia with A-fib with RVR.  Lactic acid elevated 2.6-->  1.6.  +Blood cultures Continue daptomycin  We will follow infectious disease recommendation concerning discharge antibiotics --Patient underwent TEE on 01/17/2024 that was normal --Follow sputum cultures - RT to induce sputum if needed --Follow blood  cultures & obtain repeats --IS, flutter for pulmonary hygiene According to [facility] facility will be ready for patient tomorrow I discussed with case management and skilled nursing facility will have her daptomycin  available tomorrow   Chronic atrial fibrillation with RVR  --Continue Eliquis  --Increased home metoprolol  25 mg daily to 50 mg BID (Unclear reason for once daily dosing for metoprolol  tartrate as is home dose) --Has been weaned off Cardizem  drip --Telemetry --Maintain K>4, Mg>2 --Mgmt of acute illness above, precipitating RVR    Chronic diastolic CHF (congestive heart failure) (HCC): 2D echo on 12/30/2014 showed EF of 55 to 60% with grade 1 diastolic dysfunction.  Patient has had elevated BNP of 293.   Echo this admission -- EF 65-70%, indeterminate diastolic paramters, mild-mod MR, mild-mod TR, mild AR Continue Lasix  --Monitor volume status, renal function & electrolytes   Myocardial injury: Troponin 35 --> 34, likely demand ischemia due to Afib RVR and sepsis --deferred aspirin  since patient is on Eliquis  Continue Lipitor   Hypothyroidism: TSH and T4 normal -Continue Synthroid  175 mcg daily   TIA (transient ischemic attack) -On Eliquis  and Lipitor   Essential hypertension - IV hydralazine as needed - Metoprolol    HLD (hyperlipidemia) - Lipitor     Subjective:  Seen and examined at bedside this morning Patient awaiting placement tomorrow   Physical Exam:   General exam: awake, alert, no acute distress HEENT: moist mucus membranes, hearing grossly normal  Respiratory system: CTAB, rhonchi vs upper airway secretion sounds, normal respiratory effort at rest on room air. Cardiovascular system: normal S1/S2, RRR,  no pedal edema.   Gastrointestinal system: soft, NT, ND, +bowel sounds. Central nervous system: A&O x2+. no gross focal neurologic deficits, normal speech Skin: dry, intact, normal temperature Psychiatry: normal mood, congruent affect, judgement and insight  appear normal       Family Communication: Discussed with patient's son at bedside Disposition: Status is: Inpatient Remains inpatient appropriate because: remains on IV therapies as above, ongoing evaluation for bacteremia with TEE planned.      Planned Discharge Destination: Home   Data Reviewed:    Latest Ref Rng & Units 01/20/2024    5:39 AM 01/19/2024    6:52 AM 01/18/2024    6:35 AM  BMP  Glucose 70 - 99 mg/dL 899  84  87   BUN 8 - 23 mg/dL 14  15  20    Creatinine 0.44 - 1.00 mg/dL 9.44  9.48  9.47   Sodium 135 - 145 mmol/L 137  138  138   Potassium 3.5 - 5.1 mmol/L 3.3  3.6  3.7   Chloride 98 - 111 mmol/L 101  100  105   CO2 22 - 32 mmol/L 28  29  28    Calcium  8.9 - 10.3 mg/dL 8.6  8.6  8.7      Vitals:   01/20/24 0800 01/20/24 1000 01/20/24 1200 01/20/24 1621  BP: 113/79 118/82 98/64 116/74  Pulse: 63 93 (!) 110 95  Resp: 15 (!) 21 15 16   Temp:  98.4 F (36.9 C) 98 F (36.7 C) 99.3 F (37.4 C)  TempSrc:   Oral   SpO2: 98% 96% 96% 94%  Weight:      Height:          Latest Ref Rng & Units 01/20/2024    5:39 AM 01/19/2024    6:52 AM 01/18/2024    6:35 AM  CBC  WBC 4.0 - 10.5 K/uL 9.5  10.8  7.1   Hemoglobin 12.0 - 15.0 g/dL 88.9  88.2  88.7   Hematocrit 36.0 - 46.0 % 34.1  37.5  34.8   Platelets 150 - 400 K/uL 500  528  468      Author: Drue ONEIDA Potter, MD 01/20/2024 6:01 PM  For on call review [email or web address].

## 2024-01-21 DIAGNOSIS — B9562 Methicillin resistant Staphylococcus aureus infection as the cause of diseases classified elsewhere: Secondary | ICD-10-CM | POA: Diagnosis not present

## 2024-01-21 DIAGNOSIS — R7881 Bacteremia: Secondary | ICD-10-CM | POA: Diagnosis not present

## 2024-01-21 LAB — CULTURE, BLOOD (ROUTINE X 2)
Culture: NO GROWTH
Culture: NO GROWTH
Special Requests: ADEQUATE
Special Requests: ADEQUATE

## 2024-01-21 MED ORDER — DAPTOMYCIN IV (FOR PTA / DISCHARGE USE ONLY): 400.0000 mg | INTRAVENOUS | 0 refills | Status: AC

## 2024-01-21 MED ORDER — DOXYCYCLINE HYCLATE 100 MG PO TABS
100.0000 mg | ORAL_TABLET | Freq: Two times a day (BID) | ORAL | Status: AC
Start: 2024-01-21 — End: 2024-01-28

## 2024-01-21 MED ORDER — POTASSIUM CHLORIDE CRYS ER 20 MEQ PO TBCR
40.0000 meq | EXTENDED_RELEASE_TABLET | ORAL | Status: AC
  Administered 2024-01-21 (×2): 40 meq via ORAL
  Filled 2024-01-21 (×2): qty 2

## 2024-01-21 NOTE — Plan of Care (Signed)

## 2024-01-21 NOTE — Progress Notes (Signed)
 Progress Note   Patient: Diane Chambers FMW:[id number] DOB: [birth year] DOA: 01/14/2024     7 DOS: the patient was seen and examined on 01/21/2024     Brief hospital course:   HPI on admission: Diane Chambers is a 89 y.o. female with medical history significant of A fib on Eliquis , dCHF, HTN, HLD, multiple stroke with left arm weakness, vascular dementia, hypothyroidism, colitis, carotid artery stenosis, who presents with SOB.   Per patient and her son at the bedside, patient has generalized weakness and decreased appetite and oral intake for more than 3 days.  Since this morning, patient developed SOB, which has been progressively worsening.  Patient has dry cough, no chest pain. Patient has fever and chills.  Her temperature is 100.7 in ED.  Per report, patient has oxygen desaturation to 87% in facility.  He is oxygen saturation is 94% in the ED.  Her son states that patient's heart rate was up to 175 earlier.  Her heart rate is 140-160 in ED.... See H&P for full HPI on admission & ED course.   Patient was admitted for further evaluation and management of acute bronchitis complicated by A-fib with RVR requiring Cardizem  infusion for heart rate control.  Pt was started on Rocephin  and Zithromax .   On next hospital day, blood cultures positive for staph, as well as MRSA positive nares PCR screen.  Vancomycin  was added and Infectious disease consulted.   Further hospital course and management as outlined below.     Assessment and Plan:   Severe Sepsis due to Staph bacteremia, acute bronchitis, metapneumovirus infection Pt met criteria for severe sepsis on admission with fever 100.7 and tachycardia with A-fib with RVR.  Lactic acid elevated 2.6-->  1.6.  +Blood cultures Continue daptomycin  We will follow infectious disease recommendation concerning discharge antibiotics --Patient underwent TEE on 01/17/2024 that was normal Continue daptomycin  and doxycycline  with end date as indicated in  infectious disease note Transition of care manager working on placement   Chronic atrial fibrillation with RVR  --Continue Eliquis  --Increased home metoprolol  25 mg daily to 50 mg BID (Unclear reason for once daily dosing for metoprolol  tartrate as is home dose) --Has been weaned off Cardizem  drip --Telemetry --Maintain K>4, Mg>2 --Mgmt of acute illness above, precipitating RVR    Chronic diastolic CHF (congestive heart failure) (HCC): 2D echo on 12/30/2014 showed EF of 55 to 60% with grade 1 diastolic dysfunction.  Patient has had elevated BNP of 293.   Echo this admission -- EF 65-70%, indeterminate diastolic paramters, mild-mod MR, mild-mod TR, mild AR Continue Lasix  --Monitor volume status, renal function & electrolytes   Myocardial injury: Troponin 35 --> 34, likely demand ischemia due to Afib RVR and sepsis --deferred aspirin  since patient is on Eliquis  Continue Lipitor   Hypothyroidism: TSH and T4 normal -Continue Synthroid  175 mcg daily   TIA (transient ischemic attack) -On Eliquis  and Lipitor   Essential hypertension - IV hydralazine as needed - Metoprolol    HLD (hyperlipidemia) - Continue Lipitor     Subjective:  Seen and examined at bedside this morning Patient denies nausea vomiting abdominal pain chest pain Transition of care manager still coordinating with skilled nursing facility for bed availability   Physical Exam:   General exam: awake, alert, no acute distress HEENT: moist mucus membranes, hearing grossly normal  Respiratory system: CTAB, rhonchi vs upper airway secretion sounds, normal respiratory effort at rest on room air. Cardiovascular system: normal S1/S2, RRR, no pedal edema.   Gastrointestinal system:  soft, NT, ND, +bowel sounds. Central nervous system: A&O x2+. no gross focal neurologic deficits, normal speech Skin: dry, intact, normal temperature Psychiatry: normal mood, congruent affect, judgement and insight appear normal       Family  Communication: Discussed with patient's son at bedside Disposition: Status is: Inpatient Remains inpatient appropriate because: remains on IV therapies as above, ongoing evaluation for bacteremia with TEE planned.      Planned Discharge Destination: Home   Data Reviewed:  Vitals:   01/21/24 0450 01/21/24 0500 01/21/24 0758 01/21/24 1128  BP: (!) 115/56  127/64 115/64  Pulse: 78  93 89  Resp: 16  17 17   Temp: 97.6 F (36.4 C)  98.4 F (36.9 C) 98 F (36.7 C)  TempSrc:      SpO2: 95%  100% 97%  Weight:  49.5 kg    Height:          Latest Ref Rng & Units 01/20/2024    5:39 AM 01/19/2024    6:52 AM 01/18/2024    6:35 AM  CBC  WBC 4.0 - 10.5 K/uL 9.5  10.8  7.1   Hemoglobin 12.0 - 15.0 g/dL 88.9  88.2  88.7   Hematocrit 36.0 - 46.0 % 34.1  37.5  34.8   Platelets 150 - 400 K/uL 500  528  468        Latest Ref Rng & Units 01/20/2024    5:39 AM 01/19/2024    6:52 AM 01/18/2024    6:35 AM  BMP  Glucose 70 - 99 mg/dL 899  84  87   BUN 8 - 23 mg/dL 14  15  20    Creatinine 0.44 - 1.00 mg/dL 9.44  9.48  9.47   Sodium 135 - 145 mmol/L 137  138  138   Potassium 3.5 - 5.1 mmol/L 3.3  3.6  3.7   Chloride 98 - 111 mmol/L 101  100  105   CO2 22 - 32 mmol/L 28  29  28    Calcium  8.9 - 10.3 mg/dL 8.6  8.6  8.7      Author: Drue ONEIDA Potter, MD 01/21/2024 1:30 PM  For on call review [email or web address].

## 2024-01-22 DIAGNOSIS — B9562 Methicillin resistant Staphylococcus aureus infection as the cause of diseases classified elsewhere: Secondary | ICD-10-CM | POA: Diagnosis not present

## 2024-01-22 DIAGNOSIS — R7881 Bacteremia: Secondary | ICD-10-CM | POA: Diagnosis not present

## 2024-01-22 LAB — CULTURE, BLOOD (ROUTINE X 2): Special Requests: ADEQUATE

## 2024-01-22 NOTE — Plan of Care (Signed)
  Problem: Education: Goal: Knowledge of General Education information will improve Description: Including pain rating scale, medication(s)/side effects and non-pharmacologic comfort measures Outcome: Adequate for Discharge   Problem: Clinical Measurements: Goal: Ability to maintain clinical measurements within normal limits will improve Outcome: Adequate for Discharge Goal: Will remain free from infection Outcome: Adequate for Discharge Goal: Diagnostic test results will improve Outcome: Adequate for Discharge Goal: Respiratory complications will improve Outcome: Adequate for Discharge Goal: Cardiovascular complication will be avoided Outcome: Adequate for Discharge   Problem: Activity: Goal: Risk for activity intolerance will decrease Outcome: Adequate for Discharge   Problem: Nutrition: Goal: Adequate nutrition will be maintained Outcome: Adequate for Discharge   Problem: Coping: Goal: Level of anxiety will decrease Outcome: Adequate for Discharge   Problem: Elimination: Goal: Will not experience complications related to bowel motility Outcome: Adequate for Discharge Goal: Will not experience complications related to urinary retention Outcome: Adequate for Discharge

## 2024-01-22 NOTE — Plan of Care (Signed)

## 2024-01-22 NOTE — TOC Progression Note (Signed)
 Transition of Care [facility]) - Progression Note    Patient Details  Name: Diane Chambers MRN: [id number] Date of Birth: [birth year]  [facility] CM/SW Contact  Lorraine LILLETTE Fenton, LCSW Phone Number: 01/22/2024, 10:27 AM  Clinical Narrative:     CSW reached out to Tammy at [facility], can accept pt for return. [place] to MD advising, Pt clear for DC. CSW called son left compliant VM after completed medical necessity and added to [facility] for transport back. DC Summary inf printed and brought to floor- no other ICM needs.     Barriers to Discharge: No Barriers Identified               Expected Discharge Plan and Services         Expected Discharge Date: 01/22/24                                     Social Drivers of Health (SDOH) Interventions SDOH Screenings   Food Insecurity: No Food Insecurity (01/16/2024)  Housing: Unknown (01/16/2024)  Transportation Needs: No Transportation Needs (01/16/2024)  Tobacco Use: Medium Risk (01/16/2024)    Readmission Risk Interventions     No data to display

## 2024-01-22 NOTE — Progress Notes (Signed)
 This RN attempted to call report twice to [facility] at 11:30 and again at 11:56, no one available to take report at this time.Was told I would get a call back from receiving nurse at [facility]. Consulting civil engineer notified.

## 2024-01-22 NOTE — Progress Notes (Signed)
 Physical Therapy Treatment Patient Details Name: Diane Chambers MRN: [id number] DOB: [birth year] Today's Date: 01/22/2024   History of Present Illness Pt is an 89 y.o. female with medical history significant of A fib on Eliquis , dCHF, HTN, HLD, multiple stroke with left arm weakness, vascular dementia, hypothyroidism, colitis, carotid artery stenosis, who presents with SOB. MD assessment includes: severe sepsis due to acute bronchitis and possible staph bacteremia, chronic atrial fibrillation with RVR, and myocardial injury with troponin 35, likely demand ischemia.    PT Comments  Pt resting in bed upon PT arrival; pt agreeable to therapy.  During session pt was SBA with bed mobility; min assist with transfers; and min assist to ambulate 10 feet with RW use (limited distance ambulating d/t pt fatigue/generalized weakness).  Impaired activity tolerance noted.  Will continue to focus on strengthening, endurance, and progressive functional mobility during hospitalization.   If plan is discharge home, recommend the following: A little help with walking and/or transfers;A little help with bathing/dressing/bathroom;Assist for transportation;Supervision due to cognitive status;Direct supervision/assist for medications management;Direct supervision/assist for financial management   Can travel by private vehicle        Equipment Recommendations  Rolling walker (2 wheels);BSC/3in1    Recommendations for Other Services       Precautions / Restrictions Precautions Precautions: Fall Recall of Precautions/Restrictions: Impaired Restrictions Weight Bearing Restrictions Per Provider Order: No     Mobility  Bed Mobility Overal bed mobility: Needs Assistance Bed Mobility: Supine to Sit, Sit to Supine     Supine to sit: Supervision, HOB elevated Sit to supine: Supervision, HOB elevated   General bed mobility comments: increased effort/time to perform on own    Transfers Overall transfer level:  Needs assistance Equipment used: Rolling walker (2 wheels) Transfers: Sit to/from Stand Sit to Stand: Min assist           General transfer comment: x1 trial standing from bed and x2 trials standing from [facility] (pt unable to fully stand on 1st trial standing from [facility] with 1 assist); vc's for UE/LE positioning and overall technique    Ambulation/Gait Ambulation/Gait assistance: Min assist Gait Distance (Feet): 10 Feet Assistive device: Rolling walker (2 wheels)   Gait velocity: decreased     General Gait Details: decreased L LE weight shift during L LE stance phase; decreased B LE foot clearance and step length; extra time to advance L LE; assist to maneuver walker intermittently   Stairs             Wheelchair Mobility     Tilt Bed    Modified Rankin (Stroke Patients Only)       Balance Overall balance assessment: Needs assistance Sitting-balance support: No upper extremity supported, Feet supported Sitting balance-Leahy Scale: Good Sitting balance - Comments: steady reaching within BOS   Standing balance support: Bilateral upper extremity supported, During functional activity, Reliant on assistive device for balance Standing balance-Leahy Scale: Poor Standing balance comment: intermittent assist to steady with dynamic standing activities                            Communication Communication Communication: Impaired Factors Affecting Communication: Hearing impaired  Cognition Arousal: Alert Behavior During Therapy: WFL for tasks assessed/performed   PT - Cognitive impairments: Awareness, Safety/Judgement, Problem solving                       PT - Cognition Comments: Pt alert and oriented to  name, DOB, location, and date.  Pt did not know why she was in the hospital. Following commands: Intact Following commands impaired: Follows one step commands with increased time    Cueing Cueing Techniques: Verbal cues, Gestural cues  Exercises       General Comments  Nursing cleared pt for participation in physical therapy.  Pt agreeable to PT session.      Pertinent Vitals/Pain Pain Assessment Pain Assessment: No/denies pain Pain Intervention(s): Limited activity within patient's tolerance, Monitored during session HR about 85 bpm at rest and increased to about 107 bpm with activity; SpO2 sats 95% or greater (at rest and with activity).     Home Living                          Prior Function            PT Goals (current goals can now be found in the care plan section) Acute Rehab PT Goals Patient Stated Goal: To walk better PT Goal Formulation: With patient Time For Goal Achievement: 01/29/24 Potential to Achieve Goals: Good Progress towards PT goals: Progressing toward goals    Frequency    Min 2X/week      PT Plan      Co-evaluation              AM-PAC PT 6 Clicks Mobility   Outcome Measure  Help needed turning from your back to your side while in a flat bed without using bedrails?: A Little Help needed moving from lying on your back to sitting on the side of a flat bed without using bedrails?: A Little Help needed moving to and from a bed to a chair (including a wheelchair)?: A Little Help needed standing up from a chair using your arms (e.g., wheelchair or bedside chair)?: A Little Help needed to walk in hospital room?: A Little Help needed climbing 3-5 steps with a railing? : A Lot 6 Click Score: 17    End of Session Equipment Utilized During Treatment: Gait belt Activity Tolerance: Patient limited by fatigue Patient left: in bed;with call bell/phone within reach;with bed alarm set Nurse Communication: Mobility status;Precautions PT Visit Diagnosis: Muscle weakness (generalized) (M62.81);Other abnormalities of gait and mobility (R26.89);Hemiplegia and hemiparesis;Difficulty in walking, not elsewhere classified (R26.2);Unsteadiness on feet (R26.81) Hemiplegia - Right/Left:  Left Hemiplegia - dominant/non-dominant: Non-dominant Hemiplegia - caused by: Cerebral infarction     Time: 9159-9089 PT Time Calculation (min) (ACUTE ONLY): 30 min  Charges:    $Gait Training: 8-22 mins $Therapeutic Activity: 8-22 mins PT General Charges $$ ACUTE PT VISIT: 1 Visit                     Damien Caulk, PT 01/22/24, 12:04 PM

## 2024-01-22 NOTE — Discharge Summary (Signed)
 Physician Discharge Summary   Patient: Diane Chambers MRN: [id number] DOB: [birth year]  Admit date:     01/14/2024  Discharge date: 01/22/24  Discharge Physician: Drue ONEIDA Potter   PCP: Pcp, No   Recommendations at discharge:  Follow-up with infectious disease  Discharge Diagnoses:  Severe Sepsis due to Staph bacteremia, acute bronchitis, metapneumovirus infection Chronic atrial fibrillation with RVR  Chronic diastolic CHF (congestive heart failure) Myocardial injury Hypothyroidism: TSH and T4 normal TIA (transient ischemic attack) Essential hypertension HLD (hyperlipidemia)   Hospital Course:   HPI on admission: Diane Chambers is a 89 y.o. female with medical history significant of A fib on Eliquis , dCHF, HTN, HLD, multiple stroke with left arm weakness, vascular dementia, hypothyroidism, colitis, carotid artery stenosis, who presents with SOB.   Per patient and her son at the bedside, patient has generalized weakness and decreased appetite and oral intake for more than 3 days.  Since this morning, patient developed SOB, which has been progressively worsening.  Patient has dry cough, no chest pain. Patient has fever and chills.  Her temperature is 100.7 in ED.  Per report, patient has oxygen desaturation to 87% in facility.  He is oxygen saturation is 94% in the ED.  Her son states that patient's heart rate was up to 175 earlier.  Her heart rate is 140-160 in ED.... See H&P for full HPI on admission & ED course.   Patient was admitted for further evaluation and management of acute bronchitis complicated by A-fib with RVR requiring Cardizem  infusion for heart rate control.  Pt was started on Rocephin  and Zithromax .   On next hospital day, blood cultures positive for staph, as well as MRSA positive nares PCR screen.  Vancomycin  was added and Infectious disease consulted.  Patient underwent TEE on 01/17/2024 that was normal.  ID made decision to switch vancomycin  to daptomycin  given  vancomycin  is high risk for kidney injury in elderly patient and the need for close monitoring of the levels.  While on daptomycin  patient will need weekly CPK, CBC, CMP and to observe closely for eosinophilia or rhabdomyolysis.  Patient also need doxycycline  for lung infection to complete a total of 2 weeks course. Patient will be on daptomycin  with end date 02/13/2024. Doxycycline  and dated 01/28/2024.  Consultants: Infectious disease Procedures performed: None Disposition: Skilled nursing facility Diet recommendation:  Cardiac diet DISCHARGE MEDICATION: Allergies as of 01/22/2024       Reactions   Calcium -containing Compounds Other (See Comments)   Restless legs   Lactose Intolerance (gi) Diarrhea        Medication List     TAKE these medications    ascorbic acid  500 MG tablet Commonly known as: VITAMIN C  Take 500 mg by mouth daily.   atorvastatin  20 MG tablet Commonly known as: LIPITOR Take 20 mg by mouth daily.   b complex vitamins capsule Take 1 capsule by mouth daily.   cetirizine 10 MG tablet Commonly known as: ZYRTEC Take 10 mg by mouth 2 (two) times daily.   cholecalciferol  25 MCG (1000 UNIT) tablet Commonly known as: VITAMIN D3 Take 1,000 Units by mouth daily.   daptomycin  IVPB Commonly known as: CUBICIN  Inject 400 mg into the vein daily for 26 days. Indication:  MRSA Bacteremia  Last Day of Therapy:  02/13/24 Labs - Once weekly:  CBC/D, BMP, CPK Fax weekly lab results  promptly to [phone] and [phone] Method of administration: IV Push Method of administration may be changed at the discretion of facility  and its pharmacy Remove PICC upon completion of antibiotics Call [phone] with critical values or questions   doxycycline  100 MG tablet Commonly known as: VIBRA -TABS Take 1 tablet (100 mg total) by mouth every 12 (twelve) hours for 7 days.   Eliquis  2.5 MG Tabs tablet Generic drug: apixaban  Take 2.5 mg by mouth 2 (two) times daily.    empagliflozin  10 MG Tabs tablet Commonly known as: JARDIANCE  Take 10 mg by mouth daily.   furosemide  40 MG tablet Commonly known as: LASIX  Take 40 mg by mouth daily.   gabapentin  100 MG capsule Commonly known as: NEURONTIN  Take 100 mg by mouth 3 (three) times daily.   halobetasol 0.05 % cream Commonly known as: ULTRAVATE Apply 1 Application topically 2 (two) times daily as needed.   hydrocortisone 2.5 % ointment Apply 1 Application topically 2 (two) times daily as needed. Use twice daily for 1 week, take 1 week break, then resume. Only use up to 2 weeks per month.   hydrOXYzine  10 MG tablet Commonly known as: ATARAX  Take 10 mg by mouth every 8 (eight) hours as needed for itching.   levothyroxine  137 MCG tablet Commonly known as: SYNTHROID  Take 137 mcg by mouth in the morning.   metoprolol  tartrate 25 MG tablet Commonly known as: LOPRESSOR  Take 25 mg by mouth daily. Hold for SBP <110   multivitamin with minerals Tabs tablet Take 1 tablet by mouth daily.   permethrin 5 % cream Commonly known as: ELIMITE Apply 1 Application topically 2 (two) times a week. Apply 1 application topical, once a day on Monday, Thursday   potassium chloride  10 MEQ tablet Commonly known as: KLOR-CON  Take 10 mEq by mouth daily.   sertraline  25 MG tablet Commonly known as: ZOLOFT  Take 25 mg by mouth daily.   traZODone  100 MG tablet Commonly known as: DESYREL  Take 100 mg by mouth at bedtime.   triamcinolone  ointment 0.1 % Commonly known as: KENALOG  Apply 1 Application topically at bedtime. Apply topically at bedtime. Soak in tub for 10-15 min in lukewarm water , get out and drip dry before applying ointment to all itchy areas.               Discharge Care Instructions  (From admission, onward)           Start     Ordered   01/21/24 0000  Change dressing on IV access line weekly and PRN  (Home infusion instructions - [facility] )        01/21/24 0905             Follow-up Information     [facility]. Go on 01/24/2024.   Specialty: Cardiology Why: Hospital Follow-Up   [facility] 01/24/2024 @ 11:00 [facility], [address], Second Floor Free Valet Parking at the door Contact information: [address] [place]  [address] [phone]               Discharge Exam: Diane Chambers   01/20/24 0726 01/21/24 0500 01/22/24 0453  Weight: 48.8 kg 49.5 kg 48.2 kg    General exam: awake, alert, no acute distress HEENT: moist mucus membranes, hearing grossly normal  Respiratory system: CTAB, rhonchi vs upper airway secretion sounds, normal respiratory effort at rest on room air. Cardiovascular system: normal S1/S2, RRR, no pedal edema.   Gastrointestinal system: soft, NT, ND, +bowel sounds. Central nervous system: A&O x3. no gross focal neurologic deficits, normal  speech Skin: dry, intact, normal temperature Psychiatry: normal mood, congruent affect, judgement and insight appear normal  Condition at discharge: good  The results of significant diagnostics from this hospitalization (including imaging, microbiology, ancillary and laboratory) are listed below for reference.   Imaging Studies: US  EKG SITE RITE Result Date: 01/18/2024 If Site Rite image not attached, placement could not be confirmed due to current cardiac rhythm.  ECHO TEE Result Date: 01/17/2024    TRANSESOPHOGEAL ECHO REPORT   Patient Name:   Diane Chambers Date of Exam: 01/17/2024 Medical Rec #:  [id number]    Height:       66.5 in Accession #:    [id number]   Weight:       111.3 lb Date of Birth:  [birth year]     BSA:          1.568 m Patient Age:    89 years     BP:           140/75 mmHg Patient Gender: F            HR:           95 bpm. Exam Location:  [facility] Procedure: Transesophageal Echo, Color Doppler and Saline Contrast Bubble Study            (Both Spectral and Color Flow Doppler  were utilized during            procedure). Indications:     Bacteremia  History:         Patient has prior history of Echocardiogram examinations, most                  recent 01/15/2024.  Sonographer:     Ashley McNeely-Sloane Referring Phys:  [id number] [facility] CAREY Diagnosing Phys: Evalene Lunger MD PROCEDURE: After discussion of the risks and benefits of a TEE, an informed consent was obtained from the patient. TEE procedure time was 30 minutes. The transesophogeal probe was passed without difficulty through the esophogus of the patient. Imaged were obtained with the patient in a left lateral decubitus position. Sedation performed by performing physician. Patients was under conscious sedation during this procedure. Anesthetic administered: 75mcg of Fentanyl , 1.5mg  of Versed . Image quality was excellent. The patient's vital signs; including heart rate, blood pressure, and oxygen saturation; remained stable throughout the procedure. The patient developed no complications during the procedure.  IMPRESSIONS  1. Left ventricular ejection fraction, by estimation, is 55 to 60%. The left ventricle has normal function. The left ventricle has no regional wall motion abnormalities. Left ventricular diastolic parameters are indeterminate.  2. Right ventricular systolic function is normal. The right ventricular size is normal.  3. No left atrial/left atrial appendage thrombus was detected.  4. The mitral valve is normal in structure. Mild to moderate mitral valve regurgitation. No evidence of mitral stenosis.  5. The aortic valve is normal in structure. Aortic valve regurgitation is trivial. No aortic stenosis is present.  6. The inferior vena cava is normal in size with greater than 50% respiratory variability, suggesting right atrial pressure of 3 mmHg.  7. Agitated saline contrast bubble study was negative, with no evidence of any interatrial shunt.  8. Conclusion(s)/Recommendation(s): Normal biventricular function without  evidence of hemodynamically significant valvular heart disease. FINDINGS  Left Ventricle: Left ventricular ejection fraction, by estimation, is 55 to 60%. The left ventricle has normal function. The left ventricle has no regional wall motion abnormalities. The left ventricular internal cavity size was normal in size.  There is  no left ventricular hypertrophy. Left ventricular diastolic parameters are indeterminate. Right Ventricle: The right ventricular size is normal. No increase in right ventricular wall thickness. Right ventricular systolic function is normal. Left Atrium: Left atrial size was normal in size. No left atrial/left atrial appendage thrombus was detected. Right Atrium: Right atrial size was normal in size. Pericardium: There is no evidence of pericardial effusion. Mitral Valve: The mitral valve is normal in structure. Mild to moderate mitral valve regurgitation. No evidence of mitral valve stenosis. There is no evidence of mitral valve vegetation. Tricuspid Valve: The tricuspid valve is normal in structure. Tricuspid valve regurgitation is mild . No evidence of tricuspid stenosis. There is no evidence of tricuspid valve vegetation. Aortic Valve: The aortic valve is normal in structure. Aortic valve regurgitation is trivial. No aortic stenosis is present. There is no evidence of aortic valve vegetation. Pulmonic Valve: The pulmonic valve was normal in structure. Pulmonic valve regurgitation is not visualized. No evidence of pulmonic stenosis. There is no evidence of pulmonic valve vegetation. Aorta: The aortic root is normal in size and structure. Venous: The inferior vena cava is normal in size with greater than 50% respiratory variability, suggesting right atrial pressure of 3 mmHg. IAS/Shunts: No atrial level shunt detected by color flow Doppler. Agitated saline contrast was given intravenously to evaluate for intracardiac shunting. Agitated saline contrast bubble study was negative, with no  evidence of any interatrial shunt. There  is no evidence of a patent foramen ovale. There is no evidence of an atrial septal defect. Additional Comments: 3D was performed not requiring image post processing on an independent workstation and was indeterminate. Timothy Gollan MD Electronically signed by Evalene Lunger MD Signature Date/Time: 01/17/2024/2:07:50 PM    Final (Updated)    ECHOCARDIOGRAM COMPLETE Result Date: 01/15/2024    ECHOCARDIOGRAM REPORT   Patient Name:   Diane Chambers Date of Exam: 01/15/2024 Medical Rec #:  [id number]    Height:       66.5 in Accession #:    [id number]   Weight:       124.1 lb Date of Birth:  08-Jul-1933     BSA:          1.642 m Patient Age:    89 years     BP:           113/70 mmHg Patient Gender: F            HR:           88 bpm. Exam Location:  [facility] Procedure: 2D Echo, Cardiac Doppler and Color Doppler (Both Spectral and Color            Flow Doppler were utilized during procedure). Indications:     Bacteremia R78.81  History:         Patient has prior history of Echocardiogram examinations, most                  recent 12/30/2014. Signs/Symptoms:Syncope; Risk                  Factors:Hypertension.  Sonographer:     Christopher Furnace Referring Phys:  [id number] DONALD BERLIN Diagnosing Phys: Keller Alluri IMPRESSIONS  1. Left ventricular ejection fraction, by estimation, is 65 to 70%. The left ventricle has normal function. There is mild left ventricular hypertrophy. Left ventricular diastolic parameters are indeterminate.  2. Right ventricular systolic function is normal. The right ventricular size is normal.  3. Left atrial size was  moderately dilated.  4. Mild to moderate mitral valve regurgitation. Moderate mitral annular calcification.  5. Tricuspid valve regurgitation is mild to moderate.  6. The aortic valve is tricuspid. Aortic valve regurgitation is mild. Conclusion(s)/Recommendation(s): No evidence of valvular vegetations on this transthoracic echocardiogram. Consider a  transesophageal echocardiogram to exclude infective endocarditis if clinically indicated. FINDINGS  Left Ventricle: Left ventricular ejection fraction, by estimation, is 65 to 70%. The left ventricle has normal function. The left ventricular internal cavity size was normal in size. There is mild left ventricular hypertrophy. Left ventricular diastolic  parameters are indeterminate. Right Ventricle: The right ventricular size is normal. No increase in right ventricular wall thickness. Right ventricular systolic function is normal. Left Atrium: Left atrial size was moderately dilated. Right Atrium: Right atrial size was normal in size. Pericardium: There is no evidence of pericardial effusion. Mitral Valve: There is mild thickening of the mitral valve leaflet(s). Moderate mitral annular calcification. Mild to moderate mitral valve regurgitation. Tricuspid Valve: The tricuspid valve is normal in structure. Tricuspid valve regurgitation is mild to moderate. Aortic Valve: The aortic valve is tricuspid. Aortic valve regurgitation is mild. Aortic valve mean gradient measures 4.0 mmHg. Aortic valve peak gradient measures 8.1 mmHg. Aortic valve area, by VTI measures 3.06 cm. Pulmonic Valve: The pulmonic valve was not well visualized. Pulmonic valve regurgitation is not visualized. Aorta: The aortic root is normal in size and structure. IAS/Shunts: The atrial septum is grossly normal.  LEFT VENTRICLE PLAX 2D LVIDd:         3.25 cm   Diastology LVIDs:         2.25 cm   LV e' lateral:   8.51 cm/s LV PW:         1.21 cm   LV E/e' lateral: 9.5 LV IVS:        1.34 cm LVOT diam:     2.00 cm LV SV:         61 LV SV Index:   37 LVOT Area:     3.14 cm  RIGHT VENTRICLE RV Basal diam:  3.24 cm RV Mid diam:    2.79 cm LEFT ATRIUM             Index        RIGHT ATRIUM           Index LA diam:        4.20 cm 2.56 cm/m   RA Area:     14.80 cm LA Vol (A2C):   76.8 ml 46.77 ml/m  RA Volume:   36.90 ml  22.47 ml/m LA Vol (A4C):   64.7 ml  39.40 ml/m LA Biplane Vol: 70.2 ml 42.75 ml/m  AORTIC VALVE AV Area (Vmax):    2.63 cm AV Area (Vmean):   2.61 cm AV Area (VTI):     3.06 cm AV Vmax:           142.00 cm/s AV Vmean:          94.000 cm/s AV VTI:            0.198 m AV Peak Grad:      8.1 mmHg AV Mean Grad:      4.0 mmHg LVOT Vmax:         119.00 cm/s LVOT Vmean:        78.100 cm/s LVOT VTI:          0.193 m LVOT/AV VTI ratio: 0.97  AORTA Ao Root diam: 2.90 cm MITRAL VALVE  TRICUSPID VALVE MV Area (PHT): 8.52 cm     TR Peak grad:   44.9 mmHg MV Decel Time: 89 msec      TR Vmax:        335.00 cm/s MV E velocity: 80.50 cm/s MV A velocity: 135.00 cm/s  SHUNTS MV E/A ratio:  0.60         Systemic VTI:  0.19 m                             Systemic Diam: 2.00 cm Keller Paterson Electronically signed by Keller Paterson Signature Date/Time: 01/15/2024/6:14:16 PM    Final    DG Chest Port 1 View Result Date: 01/14/2024 CLINICAL DATA:  Question of sepsis to evaluate for abnormality. EXAM: PORTABLE CHEST 1 VIEW COMPARISON:  None Available. FINDINGS: Borderline heart size with normal pulmonary vascularity. Calcification in the mitral valve annulus. Calcification of the aorta. Peribronchial thickening with hazy perihilar infiltrates. This could indicate edema or bronchitis. No focal consolidation. No pleural effusion or pneumothorax. Mediastinal contours appear intact. Calcification of the aorta. Degenerative changes in the spine and shoulders. Surgical clips in the right breast. IMPRESSION: Borderline heart size. Peribronchial thickening with hazy perihilar infiltrates may indicate edema or bronchitis. No focal consolidation. Electronically Signed   By: Elsie Gravely M.D.   On: 01/14/2024 16:43    Microbiology: Results for orders placed or performed during the hospital encounter of 01/14/24  Blood Culture (routine x 2)     Status: Abnormal (Preliminary result)   Collection Time: 01/14/24  3:36 PM   Specimen: Right Antecubital; Blood   Result Value Ref Range Status   Specimen Description   Final    RIGHT ANTECUBITAL Performed at [facility], [address], [place], [place] [address]    Special Requests   Final    BOTTLES DRAWN AEROBIC AND ANAEROBIC Blood Culture adequate volume Performed at [facility], [address] [place], [place] [address]    Culture  Setup Time   Final    GRAM POSITIVE COCCI AEROBIC BOTTLE ONLY Organism ID to follow CRITICAL RESULT CALLED TO, READ BACK BY AND VERIFIED WITH: MOSE BLEW PHARMD [id number] 01/15/24 HNM GRAM STAIN REVIEWED-AGREE WITH RESULT DRT    Culture (A)  Final    METHICILLIN RESISTANT STAPHYLOCOCCUS AUREUS Sent to [facility] for further susceptibility testing. Performed at [facility], [address], [place], [place] [address]    Report Status PENDING  Incomplete   Organism ID, Bacteria METHICILLIN RESISTANT STAPHYLOCOCCUS AUREUS  Final      Susceptibility   Methicillin resistant staphylococcus aureus - MIC*    CIPROFLOXACIN  <=0.5 SENSITIVE Sensitive     ERYTHROMYCIN <=0.25 SENSITIVE Sensitive     GENTAMICIN <=0.5 SENSITIVE Sensitive     OXACILLIN >=4 RESISTANT Resistant     TETRACYCLINE <=1 SENSITIVE Sensitive     VANCOMYCIN  1 SENSITIVE Sensitive     TRIMETH/SULFA <=10 SENSITIVE Sensitive     CLINDAMYCIN <=0.25 SENSITIVE Sensitive     RIFAMPIN <=0.5 SENSITIVE Sensitive     Inducible Clindamycin NEGATIVE Sensitive     LINEZOLID 2 SENSITIVE Sensitive     * METHICILLIN RESISTANT STAPHYLOCOCCUS AUREUS  Blood Culture ID Panel (Reflexed)     Status: Abnormal   Collection Time: 01/14/24  3:36 PM  Result Value Ref Range Status   Enterococcus faecalis NOT DETECTED NOT DETECTED Final   Enterococcus Faecium NOT DETECTED NOT DETECTED Final   Listeria monocytogenes  NOT DETECTED NOT DETECTED Final   Staphylococcus species DETECTED (A) NOT DETECTED Final    Comment: CRITICAL RESULT CALLED TO, READ BACK BY AND VERIFIED WITH: MOSE BLEW PHARMD  [id number] 01/15/24 HNM    Staphylococcus aureus (BCID) DETECTED (A) NOT DETECTED Final    Comment: Methicillin (oxacillin)-resistant Staphylococcus aureus (MRSA). MRSA is predictably resistant to beta-lactam antibiotics (except ceftaroline). Preferred therapy is vancomycin  unless clinically contraindicated. Patient requires contact precautions if  hospitalized. CRITICAL RESULT CALLED TO, READ BACK BY AND VERIFIED WITH: MOSE BLEW PHARMD [id number] 01/15/24 HNM    Staphylococcus epidermidis NOT DETECTED NOT DETECTED Final   Staphylococcus lugdunensis NOT DETECTED NOT DETECTED Final   Streptococcus species NOT DETECTED NOT DETECTED Final   Streptococcus agalactiae NOT DETECTED NOT DETECTED Final   Streptococcus pneumoniae NOT DETECTED NOT DETECTED Final   Streptococcus pyogenes NOT DETECTED NOT DETECTED Final   A.calcoaceticus-baumannii NOT DETECTED NOT DETECTED Final   Bacteroides fragilis NOT DETECTED NOT DETECTED Final   Enterobacterales NOT DETECTED NOT DETECTED Final   Enterobacter cloacae complex NOT DETECTED NOT DETECTED Final   Escherichia coli NOT DETECTED NOT DETECTED Final   Klebsiella aerogenes NOT DETECTED NOT DETECTED Final   Klebsiella oxytoca NOT DETECTED NOT DETECTED Final   Klebsiella pneumoniae NOT DETECTED NOT DETECTED Final   Proteus species NOT DETECTED NOT DETECTED Final   Salmonella species NOT DETECTED NOT DETECTED Final   Serratia marcescens NOT DETECTED NOT DETECTED Final   Haemophilus influenzae NOT DETECTED NOT DETECTED Final   Neisseria meningitidis NOT DETECTED NOT DETECTED Final   Pseudomonas aeruginosa NOT DETECTED NOT DETECTED Final   Stenotrophomonas maltophilia NOT DETECTED NOT DETECTED Final   Candida albicans NOT DETECTED NOT DETECTED Final   Candida auris NOT DETECTED NOT DETECTED Final   Candida glabrata NOT DETECTED NOT DETECTED Final   Candida krusei NOT DETECTED NOT DETECTED Final   Candida parapsilosis NOT DETECTED NOT DETECTED Final   Candida  tropicalis NOT DETECTED NOT DETECTED Final   Cryptococcus neoformans/gattii NOT DETECTED NOT DETECTED Final   Meth resistant mecA/C and MREJ DETECTED (A) NOT DETECTED Final    Comment: CRITICAL RESULT CALLED TO, READ BACK BY AND VERIFIED WITHBETHA MOSE BLEW [facility] 9193 01/15/24 HNM Performed at [facility], [address] [place], [place] [address]   Blood Culture (routine x 2)     Status: Abnormal   Collection Time: 01/14/24  3:51 PM   Specimen: BLOOD RIGHT FOREARM  Result Value Ref Range Status   Specimen Description   Final    BLOOD RIGHT FOREARM Performed at [facility], [address], [place], [place] [address]    Special Requests   Final    BOTTLES DRAWN AEROBIC AND ANAEROBIC Blood Culture results may not be optimal due to an inadequate volume of blood received in culture bottles Performed at [facility], [address] [place], [place] [address]    Culture  Setup Time   Final    GRAM POSITIVE COCCI ANAEROBIC BOTTLE ONLY CRITICAL VALUE NOTED.  VALUE IS CONSISTENT WITH PREVIOUSLY REPORTED AND CALLED VALUE. GRAM STAIN REVIEWED-AGREE WITH RESULT DRT    Culture (A)  Final    STAPHYLOCOCCUS AUREUS SUSCEPTIBILITIES PERFORMED ON PREVIOUS CULTURE WITHIN THE LAST 5 DAYS. Performed at [facility], [address] [place], [place] [address]    Report Status 01/17/2024 FINAL  Final  Resp panel by RT-PCR (RSV, Flu A&B, Covid) Anterior Nasal Swab     Status: None   Collection Time: 01/14/24  3:52 PM   Specimen: Anterior Nasal Swab  Result Value Ref Range Status   SARS Coronavirus 2 by RT PCR NEGATIVE NEGATIVE Final    Comment: (NOTE) SARS-CoV-2 target nucleic acids are NOT DETECTED.  The SARS-CoV-2 RNA is generally detectable in upper respiratory specimens during the acute phase of infection. The lowest concentration of SARS-CoV-2 viral copies this assay can detect is 138 copies/mL. A negative result does not preclude SARS-Cov-2 infection  and should not be used as the sole basis for treatment or other patient management decisions. A negative result may occur with  improper specimen collection/handling, submission of specimen other than nasopharyngeal swab, presence of viral mutation(s) within the areas targeted by this assay, and inadequate number of viral copies(<138 copies/mL). A negative result must be combined with clinical observations, patient history, and epidemiological information. The expected result is Negative.  Fact Sheet for Patients:  BloggerCourse.com  Fact Sheet for Healthcare Providers:  SeriousBroker.it  This test is no t yet approved or cleared by the [place] States  FDA and  has been authorized for detection and/or diagnosis of SARS-CoV-2 by FDA under an Emergency Use Authorization (EUA). This EUA will remain  in effect (meaning this test can be used) for the duration of the COVID-19 declaration under Section 564(b)(1) of the Act, 21 U.S.C.section 360bbb-3(b)(1), unless the authorization is terminated  or revoked sooner.       Influenza A by PCR NEGATIVE NEGATIVE Final   Influenza B by PCR NEGATIVE NEGATIVE Final    Comment: (NOTE) The Xpert Xpress SARS-CoV-2/FLU/RSV plus assay is intended as an aid in the diagnosis of influenza from Nasopharyngeal swab specimens and should not be used as a sole basis for treatment. Nasal washings and aspirates are unacceptable for Xpert Xpress SARS-CoV-2/FLU/RSV testing.  Fact Sheet for Patients: BloggerCourse.com  Fact Sheet for Healthcare Providers: SeriousBroker.it  This test is not yet approved or cleared by the [place]  FDA and has been authorized for detection and/or diagnosis of SARS-CoV-2 by FDA under an Emergency Use Authorization (EUA). This EUA will remain in effect (meaning this test can be used) for the duration of the COVID-19 declaration  under Section 564(b)(1) of the Act, 21 U.S.C. section 360bbb-3(b)(1), unless the authorization is terminated or revoked.     Resp Syncytial Virus by PCR NEGATIVE NEGATIVE Final    Comment: (NOTE) Fact Sheet for Patients: BloggerCourse.com  Fact Sheet for Healthcare Providers: SeriousBroker.it  This test is not yet approved or cleared by the [place]  FDA and has been authorized for detection and/or diagnosis of SARS-CoV-2 by FDA under an Emergency Use Authorization (EUA). This EUA will remain in effect (meaning this test can be used) for the duration of the COVID-19 declaration under Section 564(b)(1) of the Act, 21 U.S.C. section 360bbb-3(b)(1), unless the authorization is terminated or revoked.  Performed at [facility], [address] [place], [place] [address]   MRSA Next Gen by PCR, Nasal     Status: Abnormal   Collection Time: 01/14/24 10:24 PM   Specimen: Nasal Mucosa; Nasal Swab  Result Value Ref Range Status   MRSA by PCR Next Gen DETECTED (A) NOT DETECTED Final    Comment: RESULT CALLED TO, READ BACK BY AND VERIFIED WITH: KARA CAMPBELL, RN @1254  01/15/2024 COP (NOTE) The GeneXpert MRSA Assay (FDA approved for NASAL specimens only), is one component of a comprehensive MRSA colonization surveillance program. It is not intended to diagnose MRSA infection nor to guide or monitor treatment for MRSA infections. Test performance  is not FDA approved in patients less than 67 years old. Performed at [facility], [address] [place], [place] [address]   Respiratory (~20 pathogens) panel by PCR     Status: Abnormal   Collection Time: 01/15/24  4:11 PM   Specimen: Nasopharyngeal Swab; Respiratory  Result Value Ref Range Status   Adenovirus NOT DETECTED NOT DETECTED Final   Coronavirus 229E NOT DETECTED NOT DETECTED Final    Comment: (NOTE) The Coronavirus on the Respiratory Panel, DOES NOT test  for the novel  Coronavirus (2019 nCoV)    Coronavirus HKU1 NOT DETECTED NOT DETECTED Final   Coronavirus NL63 NOT DETECTED NOT DETECTED Final   Coronavirus OC43 NOT DETECTED NOT DETECTED Final   Metapneumovirus DETECTED (A) NOT DETECTED Final   Rhinovirus / Enterovirus NOT DETECTED NOT DETECTED Final   Influenza A NOT DETECTED NOT DETECTED Final   Influenza B NOT DETECTED NOT DETECTED Final   Parainfluenza Virus 1 NOT DETECTED NOT DETECTED Final   Parainfluenza Virus 2 NOT DETECTED NOT DETECTED Final   Parainfluenza Virus 3 NOT DETECTED NOT DETECTED Final   Parainfluenza Virus 4 NOT DETECTED NOT DETECTED Final   Respiratory Syncytial Virus NOT DETECTED NOT DETECTED Final   Bordetella pertussis NOT DETECTED NOT DETECTED Final   Bordetella Parapertussis NOT DETECTED NOT DETECTED Final   Chlamydophila pneumoniae NOT DETECTED NOT DETECTED Final   Mycoplasma pneumoniae NOT DETECTED NOT DETECTED Final    Comment: Performed at [facility], [address], [place], [place] [address]  Culture, blood (Routine X 2) w Reflex to ID Panel     Status: None   Collection Time: 01/16/24  3:43 PM   Specimen: BLOOD  Result Value Ref Range Status   Specimen Description BLOOD BLOOD RIGHT HAND AEROBIC BOTTLE ONLY  Final   Special Requests AEROBIC BOTTLE ONLY Blood Culture adequate volume  Final   Culture   Final    NO GROWTH 5 DAYS Performed at [facility], [address] [place], [place] [address]    Report Status 01/21/2024 FINAL  Final  Culture, blood (Routine X 2) w Reflex to ID Panel     Status: None   Collection Time: 01/16/24  3:43 PM   Specimen: BLOOD  Result Value Ref Range Status   Specimen Description BLOOD BLOOD LEFT HAND  Final   Special Requests   Final    BOTTLES DRAWN AEROBIC AND ANAEROBIC Blood Culture adequate volume   Culture   Final    NO GROWTH 5 DAYS Performed at [facility], [address], [place], [place] [address]    Report Status 01/21/2024  FINAL  Final  MIC (1 Drug)-blood culture; 01/14/2024; BLOOD; MRSA; Daptomycin      Status: Abnormal   Collection Time: 01/16/24  6:26 PM   Specimen: BLOOD  Result Value Ref Range Status   Min Inhibitory Conc (1 Drug) Final report (A)  Corrected    Comment: (NOTE) Performed At: [facility] [address] [place], [place] [id number] Jennette Shorter MD Ey:[id number] CORRECTED ON 08/29 AT 1435: PREVIOUSLY REPORTED AS Preliminary report    Source BLOOD  Final    Comment: Performed at [facility], [address], [place], [place] [address]  MIC Result     Status: Abnormal   Collection Time: 01/16/24  6:26 PM  Result Value Ref Range Status   Result 1 (MIC) Comment (A)  Final    Comment: (NOTE) Methicillin - resistant Staphylococcus aureus Identification performed  by account, not confirmed by this laboratory. Testing performed by broth microdilution. DAPTOMYCIN   <= 0.25 ug/mL  SUSCEPTIBLE Performed At: [facility] [address] [place], [place] [id number] Jennette Shorter MD Ey:[id number]   Urine Culture     Status: Abnormal   Collection Time: 01/17/24 12:39 AM   Specimen: Urine, Random  Result Value Ref Range Status   Specimen Description   Final    URINE, RANDOM Performed at [facility], [address] [place], [place] [address]    Special Requests   Final    NONE Reflexed from [id number] Performed at [facility], [address] [place], [place] [address]    Culture >=100,000 COLONIES/mL YEAST (A)  Final   Report Status 01/18/2024 FINAL  Final    Labs: CBC: Recent Labs  Lab 01/17/24 0629 01/18/24 0635 01/19/24 0652 01/20/24 0539  WBC 7.4 7.1 10.8* 9.5  NEUTROABS  --  4.2 7.4 5.5  HGB 10.5* 11.2* 11.7* 11.0*  HCT 33.1* 34.8* 37.5 34.1*  MCV 94.6 96.1 96.2 93.2  PLT 419* 468* 528* 500*   Basic Metabolic Panel: Recent Labs  Lab 01/16/24 0517 01/17/24 0629 01/18/24 0635 01/19/24 0652 01/20/24 0539  NA 137 139 138 138  137  K 3.8 3.7 3.7 3.6 3.3*  CL 105 107 105 100 101  CO2 26 27 28 29 28   GLUCOSE 124* 116* 87 84 100*  BUN 27* 20 20 15 14   CREATININE 0.58 0.53 0.52 0.51 0.55  CALCIUM  8.8* 8.8* 8.7* 8.6* 8.6*  MG 2.1  --   --   --   --    Liver Function Tests: No results for input(s): AST, ALT, ALKPHOS, BILITOT, PROT, ALBUMIN in the last 168 hours. CBG: No results for input(s): GLUCAP in the last 168 hours.  Discharge time spent:  37 minutes.  Signed: Drue ONEIDA Potter, MD [facility] 01/22/2024

## 2024-01-23 ENCOUNTER — Telehealth: Payer: Self-pay | Admitting: Family

## 2024-01-23 NOTE — Progress Notes (Deleted)
 [facility] Note   Referring Physician: 08/25 admission PCP: Pcp, No Cardiologist: None   Chief Complaint:    HPI:  Diane Chambers is a 89 y/o female with a history of chronic AF / RVR, hypothyroidism, TIA, HTN, hyperlipidemia, HFpEF, vascular dementia, carotid artery stenosis. Pt used to live in [place] . In June 2025 /Juy 2025 she was admitted to [facility] and transferred to [facility] due to severe case of crusted scabies with hypereosinophilia , she had multiple strokes She was treated for scabies and then came to [facility] at the end of July 2025.   Admitted 01/14/24 with generalized weakness and decreased appetite and oral intake for more than 3 days. She also had worsening SOB, dry cough, fever & chills. Diagnosed with acute bronchitis complicated by A-fib with RVR requiring Cardizem  infusion for heart rate control. Started on Rocephin  and Zithromax . On next hospital day, blood cultures positive for staph, as well as MRSA positive nares PCR screen. Vancomycin  was added and Infectious disease consulted. Echo 01/15/24: EF 65-70%, mild LVH, normal RV, mild/ moderate MR/ TR. Underwent TEE on 01/17/2024 that was normal.  She presents today for her initial HF visit with a chief complaint of    Review of Systems: [y] = yes, [ ]  = no   General: Weight gain [ ] ; Weight loss [ ] ; Anorexia [ ] ; Fatigue [ ] ; Fever [ ] ; Chills [ ] ; Weakness [ ]   Cardiac: Chest pain/pressure [ ] ; Resting SOB [ ] ; Exertional SOB [ ] ; Orthopnea [ ] ; Pedal Edema [ ] ; Palpitations [ ] ; Syncope [ ] ; Presyncope [ ] ; Paroxysmal nocturnal dyspnea[ ]   Pulmonary: Cough [ ] ; Wheezing[ ] ; Hemoptysis[ ] ; Sputum [ ] ; Snoring [ ]   GI: Vomiting[ ] ; Dysphagia[ ] ; Melena[ ] ; Hematochezia [ ] ; Heartburn[ ] ; Abdominal pain [ ] ; Constipation [ ] ; Diarrhea [ ] ; BRBPR [ ]   GU: Hematuria[ ] ; Dysuria [ ] ; Nocturia[ ]   Vascular: Pain in legs with walking [ ] ; Pain in feet  with lying flat [ ] ; Non-healing sores [ ] ; Stroke [ ] ; TIA [ ] ; Slurred speech [ ] ;  Neuro: Headaches[ ] ; Vertigo[ ] ; Seizures[ ] ; Paresthesias[ ] ;Blurred vision [ ] ; Diplopia [ ] ; Vision changes [ ]   Ortho/Skin: Arthritis [ ] ; Joint pain [ ] ; Muscle pain [ ] ; Joint swelling [ ] ; Back Pain [ ] ; Rash [ ]   Psych: Depression[ ] ; Anxiety[ ]   Heme: Bleeding problems [ ] ; Clotting disorders [ ] ; Anemia [ ]   Endocrine: Diabetes [ ] ; Thyroid  dysfunction[ ]    Past Medical History:  Diagnosis Date   Age-related macular degeneration, wet, right eye (HCC)    Carotid artery stenosis, asymptomatic, bilateral    last duplex in epic 02-18-2015  bilateral ICA <50%   Chronically dry eyes    Fracture 01/24/2018   right foot-neck of fifth metatarsal    GERD (gastroesophageal reflux disease)    Hiatal hernia    History of colitis 12/16/2016   secondary to champylobacter   History of recurrent TIAs    total x4  last one 12-29-2014---  (01-23-2018 per pt no residual's)   History of syncope    11/ 2018 approx. , cardiac work-up done by dr ladona    Hypertension    Hypothyroidism    followed by pcp   Lower urinary tract symptoms (LUTS)    OA (osteoarthritis)    knees   RLS (restless legs syndrome)    Venous stasis  01-23-2018 per pt elevates feet when sitting , if not toes become numb   Wears glasses    Wears hearing aid in right ear     Current Outpatient Medications  Medication Sig Dispense Refill   ascorbic acid  (VITAMIN C ) 500 MG tablet Take 500 mg by mouth daily.     atorvastatin  (LIPITOR) 20 MG tablet Take 20 mg by mouth daily.     b complex vitamins capsule Take 1 capsule by mouth daily.     cetirizine (ZYRTEC) 10 MG tablet Take 10 mg by mouth 2 (two) times daily.     cholecalciferol  (VITAMIN D3) 25 MCG (1000 UNIT) tablet Take 1,000 Units by mouth daily.     daptomycin  (CUBICIN ) IVPB Inject 400 mg into the vein daily for 26 days. Indication:  MRSA Bacteremia  Last Day of Therapy:   02/13/24 Labs - Once weekly:  CBC/D, BMP, CPK Fax weekly lab results  promptly to [phone] and [phone] Method of administration: IV Push Method of administration may be changed at the discretion of facility and its pharmacy Remove PICC upon completion of antibiotics Call [phone] with critical values or questions 26 Units 0   doxycycline  (VIBRA -TABS) 100 MG tablet Take 1 tablet (100 mg total) by mouth every 12 (twelve) hours for 7 days.     ELIQUIS  2.5 MG TABS tablet Take 2.5 mg by mouth 2 (two) times daily.     empagliflozin  (JARDIANCE ) 10 MG TABS tablet Take 10 mg by mouth daily.     furosemide  (LASIX ) 40 MG tablet Take 40 mg by mouth daily.     gabapentin  (NEURONTIN ) 100 MG capsule Take 100 mg by mouth 3 (three) times daily.     halobetasol (ULTRAVATE) 0.05 % cream Apply 1 Application topically 2 (two) times daily as needed.     hydrocortisone 2.5 % ointment Apply 1 Application topically 2 (two) times daily as needed. Use twice daily for 1 week, take 1 week break, then resume. Only use up to 2 weeks per month.     hydrOXYzine  (ATARAX ) 10 MG tablet Take 10 mg by mouth every 8 (eight) hours as needed for itching.     levothyroxine  (SYNTHROID ) 137 MCG tablet Take 137 mcg by mouth in the morning.     metoprolol  tartrate (LOPRESSOR ) 25 MG tablet Take 25 mg by mouth daily. Hold for SBP <110     Multiple Vitamin (MULTIVITAMIN WITH MINERALS) TABS tablet Take 1 tablet by mouth daily.     permethrin (ELIMITE) 5 % cream Apply 1 Application topically 2 (two) times a week. Apply 1 application topical, once a day on Monday, Thursday     potassium chloride  (KLOR-CON ) 10 MEQ tablet Take 10 mEq by mouth daily.     sertraline  (ZOLOFT ) 25 MG tablet Take 25 mg by mouth daily.     traZODone  (DESYREL ) 100 MG tablet Take 100 mg by mouth at bedtime.     triamcinolone  ointment (KENALOG ) 0.1 % Apply 1 Application topically at bedtime. Apply topically at bedtime. Soak in tub for 10-15 min in lukewarm  water , get out and drip dry before applying ointment to all itchy areas.     No current facility-administered medications for this visit.    Allergies  Allergen Reactions   Calcium -Containing Compounds Other (See Comments)    Restless legs   Lactose Intolerance (Gi) Diarrhea      Social History   Socioeconomic History   Marital status: Widowed    Spouse name: Not on file  Number of children: Not on file   Years of education: Not on file   Highest education level: Not on file  Occupational History   Not on file  Tobacco Use   Smoking status: Former    Current packs/day: 0.00    Types: Cigarettes    Start date: 01/23/1990    Quit date: 01/24/2016    Years since quitting: 8.0   Smokeless tobacco: Never  Vaping Use   Vaping status: Never Used  Substance and Sexual Activity   Alcohol use: Yes    Alcohol/week: 12.0 - 14.0 standard drinks of alcohol    Types: 12 - 14 Standard drinks or equivalent per week    Comment: 1-2 drink per night prior to SNF   Drug use: No   Sexual activity: Not on file  Other Topics Concern   Not on file  Social History Narrative   Not on file   Social Drivers of Health   Financial Resource Strain: Not on file  Food Insecurity: No Food Insecurity (01/16/2024)   Hunger Vital Sign    Worried About Running Out of Food in the Last Year: Never true    Ran Out of Food in the Last Year: Never true  Transportation Needs: No Transportation Needs (01/16/2024)   PRAPARE - Administrator, Civil Service (Medical): No    Lack of Transportation (Non-Medical): No  Physical Activity: Not on file  Stress: Not on file  Social Connections: Not on file  Intimate Partner Violence: Not on file      Family History  Problem Relation Age of Onset   Diabetes Mother    Hyperlipidemia Father        PHYSICAL EXAM: General:  Well appearing. No respiratory difficulty HEENT: normal Neck: supple. no JVD. Carotids 2+ bilat; no bruits. No  lymphadenopathy or thyromegaly appreciated. Cor: PMI nondisplaced. Regular rate & rhythm. No rubs, gallops or murmurs. Lungs: clear Abdomen: soft, nontender, nondistended. No hepatosplenomegaly. No bruits or masses. Good bowel sounds. Extremities: no cyanosis, clubbing, rash, edema Neuro: alert & oriented x 3, cranial nerves grossly intact. moves all 4 extremities w/o difficulty. Affect pleasant.  ECG:   ASSESSMENT & PLAN:     Diane DELENA Class, FNP 01/23/24

## 2024-01-23 NOTE — Telephone Encounter (Signed)
 Called to confirm/remind patient of their appointment at the [facility] on 01/24/24.   Appointment:   [] Confirmed  [x] Left mess   [] No answer/No voice mail  [] VM Full/unable to leave message  [] Phone not in service  Patient reminded to bring all medications and/or complete list.  Confirmed patient has transportation. Gave directions, instructed to utilize valet parking.

## 2024-01-24 ENCOUNTER — Encounter: Admitting: Family

## 2024-01-30 LAB — LAB REPORT - SCANNED: EGFR: 87

## 2024-02-01 ENCOUNTER — Telehealth: Payer: Self-pay | Admitting: Family

## 2024-02-01 NOTE — Progress Notes (Deleted)
 Advanced Heart Failure Clinic Note   Referring Physician: 08/25 admission PCP: Pcp, No Cardiologist: None   Chief Complaint:    HPI:  Diane Chambers is a 89 y/o female with a history of chronic AF / RVR, hypothyroidism, TIA, HTN, hyperlipidemia, HFpEF, vascular dementia, carotid artery stenosis. Pt used to live in [place] . In June 2025 /Juy 2025 she was admitted to [facility] and transferred to [facility] wom[facility] hospital due to severe case of crusted scabies with hypereosinophilia , she had multiple strokes She was treated for scabies and then came to [facility] for [facility]hab at the end of July 2025.   Admitted 01/14/24 with generalized weakness and decreased appetite and oral intake for more than 3 days. She also had worsening SOB, dry cough, fever & chills. Diagnosed with acute bronchitis complicated by A-fib with RVR requiring Cardizem  infusion for heart rate control. Started on Rocephin  and Zithromax . On next hospital day, blood cultures positive for staph, as well as MRSA positive nares PCR screen. Vancomycin  was added and Infectious disease consulted. Echo 01/15/24: EF 65-70%, mild LVH, normal RV, mild/ moderate MR/ TR. Underwent TEE on 01/17/2024 that was normal.  She presents today for her initial HF visit with a chief complaint of    Review of Systems: [y] = yes, [ ]  = no   General: Weight gain [ ] ; Weight loss [ ] ; Anorexia [ ] ; Fatigue [ ] ; Fever [ ] ; Chills [ ] ; Weakness [ ]   Cardiac: Chest pain/pressure [ ] ; Resting SOB [ ] ; Exertional SOB [ ] ; Orthopnea [ ] ; Pedal Edema [ ] ; Palpitations [ ] ; Syncope [ ] ; Presyncope [ ] ; Paroxysmal nocturnal dyspnea[ ]   Pulmonary: Cough [ ] ; Wheezing[ ] ; Hemoptysis[ ] ; Sputum [ ] ; Snoring [ ]   GI: Vomiting[ ] ; Dysphagia[ ] ; Melena[ ] ; Hematochezia [ ] ; Heartburn[ ] ; Abdominal pain [ ] ; Constipation [ ] ; Diarrhea [ ] ; BRBPR [ ]   GU: Hematuria[ ] ; Dysuria [ ] ; Nocturia[ ]   Vascular: Pain in legs with walking [ ] ; Pain in feet  with lying flat [ ] ; Non-healing sores [ ] ; Stroke [ ] ; TIA [ ] ; Slurred speech [ ] ;  Neuro: Headaches[ ] ; Vertigo[ ] ; Seizures[ ] ; Paresthesias[ ] ;Blurred vision [ ] ; Diplopia [ ] ; Vision changes [ ]   Ortho/Skin: Arthritis [ ] ; Joint pain [ ] ; Muscle pain [ ] ; Joint swelling [ ] ; Back Pain [ ] ; Rash [ ]   Psych: Depression[ ] ; Anxiety[ ]   Heme: Bleeding problems [ ] ; Clotting disorders [ ] ; Anemia [ ]   Endocrine: Diabetes [ ] ; Thyroid  dysfunction[ ]    Past Medical History:  Diagnosis Date   Age-related macular degeneration, wet, right eye (HCC)    Carotid artery stenosis, asymptomatic, bilateral    last duplex in epic 02-18-2015  bilateral ICA <50%   Chronically dry eyes    Fracture 01/24/2018   right foot-neck of fifth metatarsal    GERD (gastroesophageal reflux disease)    Hiatal hernia    History of colitis 12/16/2016   secondary to champylobacter   History of recurrent TIAs    total x4  last one 12-29-2014---  (01-23-2018 per pt no residual's)   History of syncope    11/ 2018 approx. , cardiac work-up done by dr ladona    Hypertension    Hypothyroidism    followed by pcp   Lower urinary tract symptoms (LUTS)    OA (osteoarthritis)    knees   RLS (restless legs syndrome)    Venous stasis  01-23-2018 per pt elevates feet when sitting , if not toes become numb   Wears glasses    Wears hearing aid in right ear     Current Outpatient Medications  Medication Sig Dispense Refill   ascorbic acid  (VITAMIN C ) 500 MG tablet Take 500 mg by mouth daily.     atorvastatin  (LIPITOR) 20 MG tablet Take 20 mg by mouth daily.     b complex vitamins capsule Take 1 capsule by mouth daily.     cetirizine (ZYRTEC) 10 MG tablet Take 10 mg by mouth 2 (two) times daily.     cholecalciferol  (VITAMIN D3) 25 MCG (1000 UNIT) tablet Take 1,000 Units by mouth daily.     daptomycin  (CUBICIN ) IVPB Inject 400 mg into the vein daily for 26 days. Indication:  MRSA Bacteremia  Last Day of Therapy:   02/13/24 Labs - Once weekly:  CBC/D, BMP, CPK Fax weekly lab results  promptly to [phone] and [phone] Method of administration: IV Push Method of administration may be changed at the discretion of facility and its pharmacy Remove PICC upon completion of antibiotics Call [phone] with critical values or questions 26 Units 0   ELIQUIS  2.5 MG TABS tablet Take 2.5 mg by mouth 2 (two) times daily.     empagliflozin  (JARDIANCE ) 10 MG TABS tablet Take 10 mg by mouth daily.     furosemide  (LASIX ) 40 MG tablet Take 40 mg by mouth daily.     gabapentin  (NEURONTIN ) 100 MG capsule Take 100 mg by mouth 3 (three) times daily.     halobetasol (ULTRAVATE) 0.05 % cream Apply 1 Application topically 2 (two) times daily as needed.     hydrocortisone 2.5 % ointment Apply 1 Application topically 2 (two) times daily as needed. Use twice daily for 1 week, take 1 week break, then resume. Only use up to 2 weeks per month.     hydrOXYzine  (ATARAX ) 10 MG tablet Take 10 mg by mouth every 8 (eight) hours as needed for itching.     levothyroxine  (SYNTHROID ) 137 MCG tablet Take 137 mcg by mouth in the morning.     metoprolol  tartrate (LOPRESSOR ) 25 MG tablet Take 25 mg by mouth daily. Hold for SBP <110     Multiple Vitamin (MULTIVITAMIN WITH MINERALS) TABS tablet Take 1 tablet by mouth daily.     permethrin (ELIMITE) 5 % cream Apply 1 Application topically 2 (two) times a week. Apply 1 application topical, once a day on Monday, Thursday     potassium chloride  (KLOR-CON ) 10 MEQ tablet Take 10 mEq by mouth daily.     sertraline  (ZOLOFT ) 25 MG tablet Take 25 mg by mouth daily.     traZODone  (DESYREL ) 100 MG tablet Take 100 mg by mouth at bedtime.     triamcinolone  ointment (KENALOG ) 0.1 % Apply 1 Application topically at bedtime. Apply topically at bedtime. Soak in tub for 10-15 min in lukewarm water , get out and drip dry before applying ointment to all itchy areas.     No current facility-administered  medications for this visit.    Allergies  Allergen Reactions   Calcium -Containing Compounds Other (See Comments)    Restless legs   Lactose Intolerance (Gi) Diarrhea      Social History   Socioeconomic History   Marital status: Widowed    Spouse name: Not on file   Number of children: Not on file   Years of education: Not on file   Highest education level: Not on file  Occupational History   Not on file  Tobacco Use   Smoking status: Former    Current packs/day: 0.00    Types: Cigarettes    Start date: 01/23/1990    Quit date: 01/24/2016    Years since quitting: 8.0   Smokeless tobacco: Never  Vaping Use   Vaping status: Never Used  Substance and Sexual Activity   Alcohol use: Yes    Alcohol/week: 12.0 - 14.0 standard drinks of alcohol    Types: 12 - 14 Standard drinks or equivalent per week    Comment: 1-2 drink per night prior to SNF   Drug use: No   Sexual activity: Not on file  Other Topics Concern   Not on file  Social History Narrative   Not on file   Social Drivers of Health   Financial Resource Strain: Not on file  Food Insecurity: No Food Insecurity (01/16/2024)   Hunger Vital Sign    Worried About Running Out of Food in the Last Year: Never true    Ran Out of Food in the Last Year: Never true  Transportation Needs: No Transportation Needs (01/16/2024)   PRAPARE - Administrator, Civil Service (Medical): No    Lack of Transportation (Non-Medical): No  Physical Activity: Not on file  Stress: Not on file  Social Connections: Not on file  Intimate Partner Violence: Not on file      Family History  Problem Relation Age of Onset   Diabetes Mother    Hyperlipidemia Father        PHYSICAL EXAM: General:  Well appearing. No respiratory difficulty HEENT: normal Neck: supple. no JVD. Carotids 2+ bilat; no bruits. No lymphadenopathy or thyromegaly appreciated. Cor: PMI nondisplaced. Regular rate & rhythm. No rubs, gallops or  murmurs. Lungs: clear Abdomen: soft, nontender, nondistended. No hepatosplenomegaly. No bruits or masses. Good bowel sounds. Extremities: no cyanosis, clubbing, rash, edema Neuro: alert & oriented x 3, cranial nerves grossly intact. moves all 4 extremities w/o difficulty. Affect pleasant.  ECG:   ASSESSMENT & PLAN:  1: HFpEF- - suspect due to  - NYHA Chambers - euvolemic - Echo 01/15/24: EF 65-70%, mild LVH, normal RV, mild/ moderate MR/ TR.  - TEE on 01/17/2024 that was normal. - continue  - BNP 01/14/24 was 293.3  2: HTN- - BP - BMET 01/20/24 reviewed: sodium 137, potassium 3.3, creatinine 0.55 & GFR >60  3: AF- - EKG  4: HLD- - LDL 01/09/15 was 97  5: Stroke- - currently at [facility]  6: Scabies- - on daptomycin  with end date 02/13/2024. - getting weekly CPK, CBC, CMP while on treatment    Diane DELENA Class, FNP 02/01/24

## 2024-02-01 NOTE — Telephone Encounter (Signed)
 Called to confirm/remind patient of their appointment at the Advanced [facility] Failure Clinic on 02/02/24.   Appointment:   [] Confirmed  [x] Left mess   [] No answer/No voice mail  [] VM Full/unable to leave message  [] Phone not in service  Patient reminded to bring all medications and/or complete list.  Confirmed patient has transportation. Gave directions, instructed to utilize valet parking.

## 2024-02-02 ENCOUNTER — Encounter: Admitting: Family

## 2024-02-06 ENCOUNTER — Telehealth: Admitting: Infectious Diseases

## 2024-02-06 LAB — LAB REPORT - SCANNED: EGFR: 83

## 2024-02-12 ENCOUNTER — Telehealth: Payer: Self-pay | Admitting: Family

## 2024-02-12 NOTE — Telephone Encounter (Signed)
 Called to confirm/remind patient of their appointment at the [facility] Clinic on 02/13/24.   Appointment:   [x] Confirmed  [] Left mess   [] No answer/No voice mail  [] VM Full/unable to leave message  [] Phone not in service  Patient reminded to bring all medications and/or complete list.  Confirmed patient has transportation. Gave directions, instructed to utilize valet parking.

## 2024-02-13 ENCOUNTER — Ambulatory Visit: Attending: Family | Admitting: Family

## 2024-02-13 ENCOUNTER — Encounter: Payer: Self-pay | Admitting: Family

## 2024-02-13 VITALS — BP 97/61 | HR 61 | Wt 99.6 lb

## 2024-02-13 DIAGNOSIS — Z7901 Long term (current) use of anticoagulants: Secondary | ICD-10-CM | POA: Insufficient documentation

## 2024-02-13 DIAGNOSIS — E039 Hypothyroidism, unspecified: Secondary | ICD-10-CM | POA: Insufficient documentation

## 2024-02-13 DIAGNOSIS — I5032 Chronic diastolic (congestive) heart failure: Secondary | ICD-10-CM | POA: Insufficient documentation

## 2024-02-13 DIAGNOSIS — I110 Hypertensive heart disease with heart failure: Secondary | ICD-10-CM | POA: Insufficient documentation

## 2024-02-13 DIAGNOSIS — I482 Chronic atrial fibrillation, unspecified: Secondary | ICD-10-CM | POA: Diagnosis present

## 2024-02-13 DIAGNOSIS — I6930 Unspecified sequelae of cerebral infarction: Secondary | ICD-10-CM | POA: Insufficient documentation

## 2024-02-13 DIAGNOSIS — I4892 Unspecified atrial flutter: Secondary | ICD-10-CM | POA: Diagnosis not present

## 2024-02-13 DIAGNOSIS — F0150 Vascular dementia without behavioral disturbance: Secondary | ICD-10-CM | POA: Insufficient documentation

## 2024-02-13 DIAGNOSIS — Z87891 Personal history of nicotine dependence: Secondary | ICD-10-CM | POA: Diagnosis not present

## 2024-02-13 DIAGNOSIS — G459 Transient cerebral ischemic attack, unspecified: Secondary | ICD-10-CM

## 2024-02-13 DIAGNOSIS — I10 Essential (primary) hypertension: Secondary | ICD-10-CM | POA: Diagnosis not present

## 2024-02-13 DIAGNOSIS — B86 Scabies: Secondary | ICD-10-CM | POA: Diagnosis not present

## 2024-02-13 DIAGNOSIS — E782 Mixed hyperlipidemia: Secondary | ICD-10-CM | POA: Diagnosis not present

## 2024-02-13 DIAGNOSIS — I6523 Occlusion and stenosis of bilateral carotid arteries: Secondary | ICD-10-CM | POA: Insufficient documentation

## 2024-02-13 DIAGNOSIS — Z7984 Long term (current) use of oral hypoglycemic drugs: Secondary | ICD-10-CM | POA: Insufficient documentation

## 2024-02-13 DIAGNOSIS — R0602 Shortness of breath: Secondary | ICD-10-CM | POA: Diagnosis present

## 2024-02-13 MED ORDER — METOPROLOL SUCCINATE ER 25 MG PO TB24: 12.5000 mg | ORAL_TABLET | Freq: Every day | ORAL | 3 refills | Status: DC

## 2024-02-13 NOTE — Patient Instructions (Signed)
 Medication Changes:  Start taking Metoprolol  12.5 MG twice daily   Lab Work:  Go downstairs to [facility] on [address] to have your blood work completed.  We will only call you if the results are abnormal or if the provider would like to make medication changes.  No news is good news.   Special Instructions // Education:  Weigh daily and take your fluid pill for an overnight weight gain of 3 lbs or more or a weight gain of 5 lbs within 1 week.   Follow-Up in: 6 weeks with Ellouise Class, FNP   Thank you for choosing [facility].    At the Advanced Heart Failure Clinic, you and your health needs are our priority. We have a designated team specialized in the treatment of Heart Failure. This Care Team includes your primary Heart Failure Specialized Cardiologist (physician), Advanced Practice Providers (APPs- Physician Assistants and Nurse Practitioners), and Pharmacist who all work together to provide you with the care you need, when you need it.   You may see any of the following providers on your designated Care Team at your next follow up:  Dr. Toribio Fuel Dr. Ezra Shuck Dr. Ria Commander Dr. Morene Brownie Ellouise Class, FNP Jaun Bash, RPH-CPP  Please be sure to bring in all your medications bottles to every appointment.   Need to Contact Us :  If you have any questions or concerns before your next appointment please send us  a message through [place] or call our office at [phone].    TO LEAVE A MESSAGE FOR THE NURSE SELECT OPTION 2, PLEASE LEAVE A MESSAGE INCLUDING: YOUR NAME DATE OF BIRTH CALL BACK NUMBER REASON FOR CALL**this is important as we prioritize the call backs  YOU WILL RECEIVE A CALL BACK THE SAME DAY AS LONG AS YOU CALL BEFORE 4:00 PM

## 2024-02-13 NOTE — Progress Notes (Signed)
 Advanced Heart Failure Clinic Note   Referring Physician: 08/25 admission PCP: Pcp, No Cardiologist: None   Chief Complaint: shortness of breath   HPI:  Diane Chambers is a 89 y/o female with a history of chronic AF / RVR, hypothyroidism, TIA, HTN, hyperlipidemia, HFpEF, vascular dementia, carotid artery stenosis. Pt used to live in [place] . In June 2025 /Juy 2025 she was admitted to [facility] and transferred to [facility] wom[facility] due to severe case of crusted scabies with hypereosinophilia , she had multiple strokes. She was treated for scabies and then came to [facility] for Rehab at the end of July 2025.   Admitted 01/14/24 with generalized weakness and decreased appetite and oral intake for more than 3 days. She also had worsening SOB, dry cough, fever & chills. Diagnosed with acute bronchitis complicated by A-fib with RVR requiring Cardizem  infusion for heart rate control. Started on Rocephin  and Zithromax . On next hospital day, blood cultures positive for staph, as well as MRSA positive nares PCR screen. Vancomycin  was added and Infectious disease consulted. Echo 01/15/24: EF 65-70%, mild LVH, normal RV, mild/ moderate MR/ TR. Underwent TEE on 01/17/2024 that was normal.  She presents today for her initial HF visit with a chief complaint of minimal shortness of breath. Has associated fatigue, infrequent palpitations, occasional dizziness. Sleeping ok on 1 pillow. Not being weighed daily at SNF. Reports getting weekly lab work due to current medication (daptomycin ). Says that she's getting PT at facility.   Denies tobacco, alcohol, drug use.    Review of Systems: [y] = yes, [ ]  = no   General: Weight gain [ ] ; Weight loss [ ] ; Anorexia [ ] ; Fatigue Davis.Dad ]; Fever [ ] ; Chills [ ] ; Weakness [ ]   Cardiac: Chest pain/pressure [ ] ; Resting SOB [ ] ; Exertional SOB [ y]; Orthopnea [ ] ; Pedal Edema [ ] ; Palpitations Davis.Dad ]; Syncope [ ] ; Presyncope [ ] ; Paroxysmal  nocturnal dyspnea[ ]   Pulmonary: Cough [ ] ; Wheezing[ ] ; Hemoptysis[ ] ; Sputum [ ] ; Snoring [ ]   GI: Vomiting[ ] ; Dysphagia[ ] ; Melena[ ] ; Hematochezia [ ] ; Heartburn[ ] ; Abdominal pain [ ] ; Constipation [ ] ; Diarrhea [ ] ; BRBPR [ ]   GU: Hematuria[ ] ; Dysuria [ ] ; Nocturia[ ]   Vascular: Pain in legs with walking [ ] ; Pain in feet with lying flat [ ] ; Non-healing sores [ ] ; Stroke [ y]; TIA [ ] ; Slurred speech [ ] ;  Neuro: Dizziness[y ]; Vertigo[ ] ; Seizures[ ] ; Paresthesias[ ] ;Blurred vision [ ] ; Diplopia [ ] ; Vision changes [ ]   Ortho/Skin: Arthritis [ ] ; Joint pain [ ] ; Muscle pain [ ] ; Joint swelling [ ] ; Back Pain [ ] ; Rash [ ]   Psych: Depression[ ] ; Anxiety[ ]   Heme: Bleeding problems [ ] ; Clotting disorders [ ] ; Anemia [ ]   Endocrine: Diabetes [ ] ; Thyroid  dysfunction[ ]    Past Medical History:  Diagnosis Date   Age-related macular degeneration, wet, right eye (HCC)    Carotid artery stenosis, asymptomatic, bilateral    last duplex in epic 02-18-2015  bilateral ICA <50%   Chronically dry eyes    Fracture 01/24/2018   right foot-neck of fifth metatarsal    GERD (gastroesophageal reflux disease)    Hiatal hernia    History of colitis 12/16/2016   secondary to champylobacter   History of recurrent TIAs    total x4  last one 12-29-2014---  (01-23-2018 per pt no residual's)   History of syncope    11/ 2018 approx. , cardiac  work-up done by dr ladona    Hypertension    Hypothyroidism    followed by pcp   Lower urinary tract symptoms (LUTS)    OA (osteoarthritis)    knees   RLS (restless legs syndrome)    Venous stasis    01-23-2018 per pt elevates feet when sitting , if not toes become numb   Wears glasses    Wears hearing aid in right ear     Current Outpatient Medications  Medication Sig Dispense Refill   ascorbic acid  (VITAMIN C ) 500 MG tablet Take 500 mg by mouth daily.     atorvastatin  (LIPITOR) 20 MG tablet Take 20 mg by mouth daily.     cholecalciferol  (VITAMIN D3)  25 MCG (1000 UNIT) tablet Take 1,000 Units by mouth daily.     daptomycin  (CUBICIN ) IVPB Inject 400 mg into the vein daily for 26 days. Indication:  MRSA Bacteremia  Last Day of Therapy:  02/13/24 Labs - Once weekly:  CBC/D, BMP, CPK Fax weekly lab results  promptly to [phone] and [phone] Method of administration: IV Push Method of administration may be changed at the discretion of facility and its pharmacy Remove PICC upon completion of antibiotics Call [phone] with critical values or questions 26 Units 0   ELIQUIS  2.5 MG TABS tablet Take 2.5 mg by mouth 2 (two) times daily.     furosemide  (LASIX ) 40 MG tablet Take 40 mg by mouth daily.     gabapentin  (NEURONTIN ) 100 MG capsule Take 100 mg by mouth 3 (three) times daily.     halobetasol (ULTRAVATE) 0.05 % cream Apply 1 Application topically 2 (two) times daily as needed.     hydrocortisone 2.5 % ointment Apply 1 Application topically 2 (two) times daily as needed. Use twice daily for 1 week, take 1 week break, then resume. Only use up to 2 weeks per month.     hydrOXYzine  (ATARAX ) 10 MG tablet Take 10 mg by mouth every 8 (eight) hours as needed for itching.     levothyroxine  (SYNTHROID ) 137 MCG tablet Take 137 mcg by mouth in the morning.     metoprolol  tartrate (LOPRESSOR ) 25 MG tablet Take 25 mg by mouth daily. Hold for SBP <110     sertraline  (ZOLOFT ) 25 MG tablet Take 25 mg by mouth daily.     triamcinolone  ointment (KENALOG ) 0.1 % Apply 1 Application topically at bedtime. Apply topically at bedtime. Soak in tub for 10-15 min in lukewarm water , get out and drip dry before applying ointment to all itchy areas.     b complex vitamins capsule Take 1 capsule by mouth daily.     cetirizine (ZYRTEC) 10 MG tablet Take 10 mg by mouth 2 (two) times daily.     empagliflozin  (JARDIANCE ) 10 MG TABS tablet Take 10 mg by mouth daily.     Multiple Vitamin (MULTIVITAMIN WITH MINERALS) TABS tablet Take 1 tablet by mouth daily.      permethrin (ELIMITE) 5 % cream Apply 1 Application topically 2 (two) times a week. Apply 1 application topical, once a day on Monday, Thursday     potassium chloride  (KLOR-CON ) 10 MEQ tablet Take 10 mEq by mouth daily.     traZODone  (DESYREL ) 100 MG tablet Take 100 mg by mouth at bedtime.     No current facility-administered medications for this visit.    Allergies  Allergen Reactions   Calcium -Containing Compounds Other (See Comments)    Restless legs   Lactose Intolerance (Gi) Diarrhea  Social History   Socioeconomic History   Marital status: Widowed    Spouse name: Not on file   Number of children: Not on file   Years of education: Not on file   Highest education level: Not on file  Occupational History   Not on file  Tobacco Use   Smoking status: Former    Current packs/day: 0.00    Types: Cigarettes    Start date: 01/23/1990    Quit date: 01/24/2016    Years since quitting: 8.0   Smokeless tobacco: Never  Vaping Use   Vaping status: Never Used  Substance and Sexual Activity   Alcohol use: Yes    Alcohol/week: 12.0 - 14.0 standard drinks of alcohol    Types: 12 - 14 Standard drinks or equivalent per week    Comment: 1-2 drink per night prior to SNF   Drug use: No   Sexual activity: Not on file  Other Topics Concern   Not on file  Social History Narrative   Not on file   Social Drivers of Health   Financial Resource Strain: Not on file  Food Insecurity: No Food Insecurity (01/16/2024)   Hunger Vital Sign    Worried About Running Out of Food in the Last Year: Never true    Ran Out of Food in the Last Year: Never true  Transportation Needs: No Transportation Needs (01/16/2024)   PRAPARE - Administrator, Civil Service (Medical): No    Lack of Transportation (Non-Medical): No  Physical Activity: Not on file  Stress: Not on file  Social Connections: Not on file  Intimate Partner Violence: Not on file      Family History  Problem Relation Age  of Onset   Diabetes Mother    Hyperlipidemia Father    Vitals:   02/13/24 1018  BP: 97/61  Pulse: 61  SpO2: 96%  Weight: 99 lb 9.6 oz (45.2 kg)   Wt Readings from Last 3 Encounters:  02/13/24 99 lb 9.6 oz (45.2 kg)  01/22/24 106 lb 4.2 oz (48.2 kg)  01/29/18 128 lb 9 oz (58.3 kg)   Lab Results  Component Value Date   CREATININE 0.55 01/20/2024   CREATININE 0.51 01/19/2024   CREATININE 0.52 01/18/2024    PHYSICAL EXAM:  General: Thin appearing female in wheelchair.  Cor: No JVD. Regular rhythm, normal rate.  Lungs: clear Abdomen: soft, nontender, nondistended. Extremities: no edema Neuro:. Affect pleasant   ECG: aflutter with HR 94, prolonged QT of 490 (personally reviewed)   ASSESSMENT & PLAN:  1: HFpEF- - suspect due to AF/ flutter - NYHA Chambers II - euvolemic - order for daily weights written for SNF - Echo 01/15/24: EF 65-70%, mild LVH, normal RV, mild/ moderate MR/ TR.  - TEE on 01/17/2024 that was normal. - continue jardiance  10mg  daily - continue furosemide  40mg  daily / potassium 10meq daily - current BP will not tolerate MRA - BNP 01/14/24 was 293.3  2: HTN- - BP 97/61 - BMET 01/20/24 reviewed: sodium 137, potassium 3.3, creatinine 0.55 & GFR >60  3: AF/ flutter- - EKG today is a flutter with HR 94, prolonged QT of 490. Currently on antidepressants/ antibiotics - continue apixaban  2.5mg  BID - change metoprolol  tartrate to 12.5mg  BID instead of 25mg  daily. May need to go to 25mg  BID - HR 64 to 94 while in office  4: HLD- - LDL 01/09/15 was 97 - continue atorvastatin  20mg  daily - will get lipid panel updated at next  visit  5: Stroke- - currently at [facility] and receiving PT  6: Scabies- - on daptomycin  with end date 02/13/2024. - getting weekly CPK, CBC, CMP while on treatment   Return in 6 weeks, sooner if needed.   I spent 31 minutes reviewing records, interviewing/ examing patient and managing plan/ orders.    Diane DELENA Class,  FNP 02/13/24

## 2024-02-29 ENCOUNTER — Telehealth: Payer: Self-pay | Admitting: Family

## 2024-03-25 ENCOUNTER — Telehealth: Payer: Self-pay | Admitting: Family

## 2024-03-25 NOTE — Progress Notes (Deleted)
 Advanced [facility] Failure Clinic Note   Referring Physician: 08/25 admission PCP: Pcp, No Cardiologist: None   Chief Complaint:    HPI:  Ms Zadrozny is a 89 y/o female with a history of chronic AF / RVR, hypothyroidism, TIA, HTN, hyperlipidemia, HFpEF, vascular dementia, carotid artery stenosis. Pt used to live in [place] . In June 2025 /Juy 2025 she was admitted to [facility] and transferred to [facility] due to severe case of crusted scabies with hypereosinophilia , she had multiple strokes. She was treated for scabies and then came to [facility] at the end of July 2025.   Admitted 01/14/24 with generalized weakness and decreased appetite and oral intake for more than 3 days. She also had worsening SOB, dry cough, fever & chills. Diagnosed with acute bronchitis complicated by A-fib with RVR requiring Cardizem  infusion for heart rate control. Started on Rocephin  and Zithromax . On next hospital day, blood cultures positive for staph, as well as MRSA positive nares PCR screen. Vancomycin  was added and Infectious disease consulted. Echo 01/15/24: EF 65-70%, mild LVH, normal RV, mild/ moderate MR/ TR. Underwent TEE on 01/17/2024 that was normal.  She presents today for a HF follow-up visit with a chief complaint of   Denies tobacco, alcohol, drug use.    ROS: All systems negative except what is listed in HPI, PMH and Problem List   Past Medical History:  Diagnosis Date   Age-related macular degeneration, wet, right eye (HCC)    Carotid artery stenosis, asymptomatic, bilateral    last duplex in epic 02-18-2015  bilateral ICA <50%   Chronically dry eyes    Fracture 01/24/2018   right foot-neck of fifth metatarsal    GERD (gastroesophageal reflux disease)    Hiatal hernia    History of colitis 12/16/2016   secondary to champylobacter   History of recurrent TIAs    total x4  last one 12-29-2014---  (01-23-2018 per pt no residual's)    History of syncope    11/ 2018 approx. , cardiac work-up done by dr ladona    Hypertension    Hypothyroidism    followed by pcp   Lower urinary tract symptoms (LUTS)    OA (osteoarthritis)    knees   RLS (restless legs syndrome)    Venous stasis    01-23-2018 per pt elevates feet when sitting , if not toes become numb   Wears glasses    Wears hearing aid in right ear     Current Outpatient Medications  Medication Sig Dispense Refill   ascorbic acid  (VITAMIN C ) 500 MG tablet Take 500 mg by mouth daily.     atorvastatin  (LIPITOR) 20 MG tablet Take 20 mg by mouth daily.     b complex vitamins capsule Take 1 capsule by mouth daily.     cetirizine (ZYRTEC) 10 MG tablet Take 10 mg by mouth 2 (two) times daily.     cholecalciferol  (VITAMIN D3) 25 MCG (1000 UNIT) tablet Take 1,000 Units by mouth daily.     ELIQUIS  2.5 MG TABS tablet Take 2.5 mg by mouth 2 (two) times daily.     empagliflozin  (JARDIANCE ) 10 MG TABS tablet Take 10 mg by mouth daily.     furosemide  (LASIX ) 40 MG tablet Take 40 mg by mouth daily.     gabapentin  (NEURONTIN ) 100 MG capsule Take 100 mg by mouth 3 (three) times daily.     halobetasol (ULTRAVATE) 0.05 % cream Apply 1 Application topically 2 (two)  times daily as needed.     hydrocortisone 2.5 % ointment Apply 1 Application topically 2 (two) times daily as needed. Use twice daily for 1 week, take 1 week break, then resume. Only use up to 2 weeks per month.     hydrOXYzine  (ATARAX ) 10 MG tablet Take 10 mg by mouth every 8 (eight) hours as needed for itching.     levothyroxine  (SYNTHROID ) 137 MCG tablet Take 137 mcg by mouth in the morning.     metoprolol  tartrate (LOPRESSOR ) 25 MG tablet Take 12.5 mg by mouth 2 (two) times daily. Hold for SBP <110     Multiple Vitamin (MULTIVITAMIN WITH MINERALS) TABS tablet Take 1 tablet by mouth daily.     permethrin (ELIMITE) 5 % cream Apply 1 Application topically 2 (two) times a week. Apply 1 application topical, once a day on  Monday, Thursday     potassium chloride  (KLOR-CON ) 10 MEQ tablet Take 10 mEq by mouth daily.     sertraline  (ZOLOFT ) 25 MG tablet Take 25 mg by mouth daily.     traZODone  (DESYREL ) 100 MG tablet Take 100 mg by mouth at bedtime.     triamcinolone  ointment (KENALOG ) 0.1 % Apply 1 Application topically at bedtime. Apply topically at bedtime. Soak in tub for 10-15 min in lukewarm water , get out and drip dry before applying ointment to all itchy areas.     No current facility-administered medications for this visit.    Allergies  Allergen Reactions   Calcium -Containing Compounds Other (See Comments)    Restless legs   Lactose Intolerance (Gi) Diarrhea      Social History   Socioeconomic History   Marital status: Widowed    Spouse name: Not on file   Number of children: Not on file   Years of education: Not on file   Highest education level: Not on file  Occupational History   Not on file  Tobacco Use   Smoking status: Former    Current packs/day: 0.00    Types: Cigarettes    Start date: 01/23/1990    Quit date: 01/24/2016    Years since quitting: 8.1   Smokeless tobacco: Never  Vaping Use   Vaping status: Never Used  Substance and Sexual Activity   Alcohol use: Yes    Alcohol/week: 12.0 - 14.0 standard drinks of alcohol    Types: 12 - 14 Standard drinks or equivalent per week    Comment: 1-2 drink per night prior to SNF   Drug use: No   Sexual activity: Not on file  Other Topics Concern   Not on file  Social History Narrative   Not on file   Social Drivers of Health   Financial Resource Strain: Not on file  Food Insecurity: No Food Insecurity (01/16/2024)   Hunger Vital Sign    Worried About Running Out of Food in the Last Year: Never true    Ran Out of Food in the Last Year: Never true  Transportation Needs: No Transportation Needs (01/16/2024)   PRAPARE - Administrator, Civil Service (Medical): No    Lack of Transportation (Non-Medical): No  Physical  Activity: Not on file  Stress: Not on file  Social Connections: Not on file  Intimate Partner Violence: Not on file      Family History  Problem Relation Age of Onset   Diabetes Mother    Hyperlipidemia Father       PHYSICAL EXAM:  General: Thin appearing female in wheelchair.  Cor: No JVD. Regular rhythm, normal rate.  Lungs: clear Abdomen: soft, nontender, nondistended. Extremities: no edema Neuro:. Affect pleasant   ECG: not done   ASSESSMENT & PLAN:  1: HFpEF- - suspect due to AF/ flutter - NYHA class II - euvolemic - order for daily weights written for SNF - weight 99.6 pounds from last visit here 6 weeks ago - Echo 01/15/24: EF 65-70%, mild LVH, normal RV, mild/ moderate MR/ TR.  - TEE on 01/17/2024 that was normal. - continue jardiance  10mg  daily - continue furosemide  40mg  daily / potassium 10meq daily - current BP will not tolerate MRA - BNP 01/14/24 was 293.3  2: HTN- - BP - BMET 01/20/24 reviewed: sodium 137, potassium 3.3, creatinine 0.55 & GFR >60  3: AF/ flutter- - EKG today is a flutter with HR 94, prolonged QT of 490. Currently on antidepressants/ antibiotics - continue apixaban  2.5mg  BID - continue metoprolol  tartrate 12.5mg  BID. . May need to go to 25mg  BID - HR   4: HLD- - LDL 01/09/15 was 97 - continue atorvastatin  20mg  daily - will get lipid panel updated at next visit  5: Stroke- - currently at [facility] and receiving PT  6: Scabies- - on daptomycin  with end date 02/13/2024. - getting weekly CPK, CBC, CMP while on treatment     Ellouise DELENA Class, FNP 03/25/24

## 2024-03-25 NOTE — Telephone Encounter (Signed)
 Called to confirm/remind patient of their appointment at the [facility] Clinic on 03/26/24.   Appointment:   [] Confirmed  [] Left mess   [] No answer/No voice mail  [] VM Full/unable to leave message  [x] Phone not in service  Patient reminded to bring all medications and/or complete list.  Confirmed patient has transportation. Gave directions, instructed to utilize valet parking.

## 2024-03-26 ENCOUNTER — Telehealth: Payer: Self-pay | Admitting: Family

## 2024-03-26 ENCOUNTER — Encounter: Admitting: Family

## 2024-03-26 NOTE — Telephone Encounter (Signed)
 Patient did not show for her Heart Failure Clinic appointment on 06/06/23.

## 2024-04-23 ENCOUNTER — Emergency Department
Admission: EM | Admit: 2024-04-23 | Discharge: 2024-04-23 | Disposition: A | Discharge status: Home / Self Care | Attending: Emergency Medicine | Admitting: Emergency Medicine

## 2024-04-23 ENCOUNTER — Other Ambulatory Visit: Payer: Self-pay

## 2024-04-23 DIAGNOSIS — W01198A Fall on same level from slipping, tripping and stumbling with subsequent striking against other object, initial encounter: Secondary | ICD-10-CM | POA: Insufficient documentation

## 2024-04-23 DIAGNOSIS — W19XXXA Unspecified fall, initial encounter: Secondary | ICD-10-CM

## 2024-04-23 DIAGNOSIS — I482 Chronic atrial fibrillation, unspecified: Secondary | ICD-10-CM | POA: Diagnosis not present

## 2024-04-23 DIAGNOSIS — S32039A Unspecified fracture of third lumbar vertebra, initial encounter for closed fracture: Secondary | ICD-10-CM | POA: Diagnosis not present

## 2024-04-23 DIAGNOSIS — M545 Low back pain, unspecified: Secondary | ICD-10-CM | POA: Diagnosis present

## 2024-04-23 DIAGNOSIS — Z7901 Long term (current) use of anticoagulants: Secondary | ICD-10-CM | POA: Diagnosis not present

## 2024-04-23 MED ORDER — ACETAMINOPHEN 500 MG PO TABS
1000.0000 mg | ORAL_TABLET | Freq: Once | ORAL | Status: AC
  Administered 2024-04-23: 1000 mg via ORAL
  Filled 2024-04-23: qty 2

## 2024-04-23 MED ORDER — METHOCARBAMOL 500 MG PO TABS: 500.0000 mg | ORAL_TABLET | Freq: Three times a day (TID) | ORAL | 0 refills | Status: DC | PRN

## 2024-04-23 MED ORDER — METHOCARBAMOL 500 MG PO TABS
500.0000 mg | ORAL_TABLET | Freq: Once | ORAL | Status: AC
  Administered 2024-04-23: 500 mg via ORAL
  Filled 2024-04-23: qty 1

## 2024-04-23 MED ORDER — LIDOCAINE 5 % EX PTCH
2.0000 | MEDICATED_PATCH | CUTANEOUS | Status: DC
  Administered 2024-04-23: 2 via TRANSDERMAL
  Filled 2024-04-23: qty 2

## 2024-04-23 MED ORDER — LIDOCAINE 5 % EX PTCH: 1.0000 | MEDICATED_PATCH | Freq: Two times a day (BID) | CUTANEOUS | 1 refills | Status: AC

## 2024-04-23 MED ORDER — METHOCARBAMOL 500 MG PO TABS: 500.0000 mg | ORAL_TABLET | Freq: Three times a day (TID) | ORAL | 0 refills | Status: AC | PRN

## 2024-04-23 MED ORDER — LIDOCAINE 5 % EX PTCH: 1.0000 | MEDICATED_PATCH | Freq: Two times a day (BID) | CUTANEOUS | 1 refills | Status: DC

## 2024-04-23 MED ORDER — ACETAMINOPHEN 500 MG PO TABS: 1000.0000 mg | ORAL_TABLET | Freq: Four times a day (QID) | ORAL | 2 refills | Status: AC | PRN

## 2024-04-23 NOTE — Discharge Instructions (Signed)
 Keep the brace on while up and moving, can certainly continue to wear it while sleeping and laying down.  It is okay to briefly take it off to shower or change clothes  Can follow-up with neurosurgery in the clinic as needed  Use Tylenol  for pain and fevers.  Up to 1000 mg per dose, up to 4 times per day.  Do not take more than 4000 mg of Tylenol /acetaminophen  within 24 hours..  Please use lidocaine  patches at your site of pain.  Apply 1 patch at a time, leave on for 12 hours, then remove for 12 hours.  12 hours on, 12 hours off.  Do not apply more than 1 patch at a time.  Use Robaxin  muscle relaxer as needed for more severe/breakthrough pain, up to 3 times per day. This medication can make some people sleepy, so do not use while driving, working or designer, television/film set

## 2024-04-23 NOTE — ED Notes (Signed)
 Pt assisted w/ walker to bathroom. Tolerated well with min assistance

## 2024-04-23 NOTE — ED Notes (Signed)
 This tech assisted pt to the restroom w/ walker at this time.

## 2024-04-23 NOTE — ED Notes (Signed)
 Called [facility] that stated they would come pick her up.

## 2024-04-23 NOTE — ED Provider Notes (Signed)
 [facility] Provider Note    Event Date/Time   First MD Initiated Contact with Patient 04/23/24 [phone]     (approximate)   History   Back Pain   HPI  Diane Chambers is a 89 y.o. female who presents to the ED for evaluation of Back Pain   I reviewed medical DC summary from 3 months ago.  History of A-fib on Eliquis , diastolic dysfunction.   Patient presents to the ED from a local SNF for evaluation of back pain and recently imaged L3 fracture.  Patient and EMS report that patient fell this past Saturday, 3 days ago.  Patient reports reaching for something, slipping and accidentally falling backwards and striking her lower/mid back.  Imaging performed outside of our facility reportedly demonstrated an L3 fracture yesterday.  She presents to the ED today because of this without additional injuries or other concerns.  She reports 6/10 pain to her back and has not yet had any analgesia today..  Toileting at her baseline after this fall she reports no other injuries or concerns   Physical Exam   Triage Vital Signs: ED Triage Vitals  Encounter Vitals Group     BP 04/23/24 0840 133/73     Girls Systolic BP Percentile --      Girls Diastolic BP Percentile --      Boys Systolic BP Percentile --      Boys Diastolic BP Percentile --      Pulse Rate 04/23/24 0840 (!) 108     Resp 04/23/24 0840 20     Temp 04/23/24 0840 97.9 F (36.6 C)     Temp Source 04/23/24 0840 Oral     SpO2 04/23/24 0840 100 %     Weight 04/23/24 0843 119 lb 9.6 oz (54.3 kg)     Height 04/23/24 0841 5' 6 (1.676 m)     Head Circumference --      [facility] --      Pain Score --      Pain Loc --      Pain Education --      Exclude from Growth Chart --     Most recent vital signs: Vitals:   04/23/24 0840  BP: 133/73  Pulse: (!) 108  Resp: 20  Temp: 97.9 F (36.6 C)  SpO2: 100%    General: Awake, no distress.  CV:  Good peripheral perfusion.  Resp:  Normal effort.  Abd:  No  distention.  MSK:  No deformity noted.  Upper lumbar tenderness is present around L3 reported fracture. Neuro:  No focal deficits appreciated.  Full strength and sensation to bilateral lower extremities Other:     ED Results / Procedures / Treatments   Labs (all labs ordered are listed, but only abnormal results are displayed) Labs Reviewed - No data to display  EKG   RADIOLOGY   Official radiology report(s): No results found.  PROCEDURES and INTERVENTIONS:  Procedures  Medications  lidocaine  (LIDODERM ) 5 % 2 patch (2 patches Transdermal Patch Applied 04/23/24 0855)  acetaminophen  (TYLENOL ) tablet 1,000 mg (1,000 mg Oral Given 04/23/24 0858)  methocarbamol  (ROBAXIN ) tablet 500 mg (500 mg Oral Given 04/23/24 0858)     IMPRESSION / MDM / ASSESSMENT AND PLAN / ED COURSE  I reviewed the triage vital signs and the nursing notes.  Differential diagnosis includes, but is not limited to, lumbar fracture, cauda equina, ICH, uncontrolled pain,  {Patient presents with symptoms of an acute illness or injury  that is potentially life-threatening.  Patient presents with back pain after a fall that occurred a few days ago and reported L3 fracture.  No other injuries on my exam, no red flag features, no neurologic deficits.  Will obtain TLSO bracing, provide nonnarcotic multimodal analgesia and reassess.  Suspect she will be suitable for outpatient management.  Clinical Course as of 04/23/24 1045  Tue Apr 23, 2024  1037 Fitted with a brace, pain is controlled and she is ambulatory at her baseline with a walker.  Suitable for outpatient management [DS]    Clinical Course User Index [DS] Claudene Rover, MD     FINAL CLINICAL IMPRESSION(S) / ED DIAGNOSES   Final diagnoses:  Fall, initial encounter  Closed fracture of third lumbar vertebra, unspecified fracture morphology, initial encounter (HCC)     Rx / DC Orders   ED Discharge Orders          Ordered    lidocaine  (LIDODERM ) 5  %  Every 12 hours        04/23/24 1036    methocarbamol  (ROBAXIN ) 500 MG tablet  Every 8 hours PRN        04/23/24 1036             Note:  This document was prepared using Dragon voice recognition software and may include unintentional dictation errors.   Claudene Rover, MD 04/23/24 1045

## 2024-04-23 NOTE — ED Triage Notes (Signed)
 Coming from [facility]. Fall on sat night and Sunday morning. co of back pain since Sunday 6/10. x-ray at facility showed L3 fracture in back. Hurts more when she lays down than when she is sitting up. AOX4. Uses a walker normally to walk. On Saturday she was reaching out for clothes and fell.   Patient states that when she sits up she is in pain in her mid back.   155/75, 98RA, 109,

## 2024-04-23 NOTE — ED Notes (Signed)
 Attempted to call [facility].

## 2024-04-23 NOTE — Progress Notes (Signed)
 Orthopedic Tech Progress Note Patient Details:  Diane Chambers Jul 16, 1933 [id number]  Called in stat order to [facility] for a TLSO BRACE for a patient who is being discharged per RN  Patient ID: Diane Chambers, female   DOB: [birth year], 89 y.o.   MRN: [id number]  Diane Chambers Pac 04/23/2024, 9:04 AM

## 2024-04-23 NOTE — ED Notes (Signed)
 Spoke with Son on the phone [phone], son verbally stated he understood.

## 2024-04-26 ENCOUNTER — Telehealth: Payer: Self-pay

## 2024-04-26 NOTE — Progress Notes (Addendum)
 Referring Physician:  No referring provider defined for this encounter.  Primary Physician:  Pcp, No  History of Present Illness: 04/30/2024 [name] has a history of chronic afib with RVR, chronic diastolic CHF, endocarditis, HTN, TIA, myocardial injury, hypothyroidism, hyperlipidemia, vascular dementia.   Son Diane Chambers helps with history as needed. She has known vascular dementia.   History of lumbar surgery about 3 years ago.   Seen in ED on 04/23/24 with complaints of back pain. She reportedly had portable xrays done at SNF showing L3 compression fracture. She was given TLSO from ED.   She is here for follow up.   She has intermittent mid to lower back pain. She is about the same since her injury. No radiation of pain into ribs. No leg pain. She notes some weakness and giving way of right leg. Pain is worse with laying on her back. Pain is better with sitting up or standing.   She is on ELIQUIS .   Tobacco use: Does not smoke.   Bowel/Bladder Dysfunction: chronic bowel urgency x years  Conservative measures:  Physical therapy:  has not participated in Multimodal medical therapy including regular antiinflammatories:  Tylenol , Lidocaine  patches, Robaxin  Injections:  no epidural steroid injections  Past Surgery:  Lumbar surgery about 3 years ago in [place].   The symptoms are causing a significant impact on the patient's life.   Review of Systems:  A 10 point review of systems is negative, except for the pertinent positives and negatives detailed in the HPI.  Past Medical History: Past Medical History:  Diagnosis Date   Age-related macular degeneration, wet, right eye (HCC)    Carotid artery stenosis, asymptomatic, bilateral    last duplex in epic 02-18-2015  bilateral ICA <50%   Chronically dry eyes    Fracture 01/24/2018   right foot-neck of fifth metatarsal    GERD (gastroesophageal reflux disease)    Hiatal hernia    History of colitis 12/16/2016   secondary to  champylobacter   History of recurrent TIAs    total x4  last one 12-29-2014---  (01-23-2018 per pt no residual's)   History of syncope    11/ 2018 approx. , cardiac work-up done by dr ladona    Hypertension    Hypothyroidism    followed by pcp   Lower urinary tract symptoms (LUTS)    OA (osteoarthritis)    knees   RLS (restless legs syndrome)    Venous stasis    01-23-2018 per pt elevates feet when sitting , if not toes become numb   Wears glasses    Wears hearing aid in right ear     Past Surgical History: Past Surgical History:  Procedure Laterality Date   APPENDECTOMY  AGE 64   CARDIOVASCULAR STRESS TEST  04-28-2017  dr ladona   Low risk nuclear study w/ small sized , mild intensity, reversible apical, inferoapical perfusion defect , no ischemia/  normal LV function and wall motion , EF 75%   CATARACT EXTRACTION W/ INTRAOCULAR LENS  IMPLANT, BILATERAL  2004;  2006   DILATION AND CURETTAGE OF UTERUS  YRS AGO   X3   EYE SURGERY Right 2011 APPROX.   CORNEA (FOR EROSION)   FOOT SURGERY Right 2004   [facility]ORTON'S NEUROMA   FRACTURE SURGERY  TEEN   RIGHT ARM , PNNING   LAPAROSCOPIC CHOLECYSTECTOMY  2011  APPROX.   TEE WITHOUT CARDIOVERSION N/A 01/17/2024   Procedure: ECHOCARDIOGRAM, TRANSESOPHAGEAL;  Surgeon: Perla Evalene PARAS, MD;  Location:  [facility] ORS;  Service: Cardiovascular;  Laterality: N/A;   TONSILLECTOMY  CHILD   TOTAL KNEE ARTHROPLASTY Right 2010   TOTAL KNEE ARTHROPLASTY Left 01/29/2018   Procedure: LEFT TOTAL KNEE ARTHROPLASTY;  Surgeon: Melodi Lerner, MD;  Location: [facility] ORS;  Service: Orthopedics;  Laterality: Left;   TRANSTHORACIC ECHOCARDIOGRAM  05-03-2017   dr ladona   moderate concentric LVH, ef 66%/  mild to moderate MR with moderate MV leaflet calcification with no stenosis/  mild to moderate TR/  PASP   TRIGGER FINGER RELEASE  2005   RIGHT THUMB    Allergies: Allergies as of 04/30/2024 - Review Complete 04/23/2024  Allergen Reaction Noted    Calcium -containing compounds Other (See Comments) 12/16/2016   Lactose intolerance (gi) Diarrhea 01/29/2018    Medications: Outpatient Encounter Medications as of 04/30/2024  Medication Sig   acetaminophen  (TYLENOL ) 500 MG tablet Take 2 tablets (1,000 mg total) by mouth every 6 (six) hours as needed.   ascorbic acid  (VITAMIN C ) 500 MG tablet Take 500 mg by mouth daily.   atorvastatin  (LIPITOR) 20 MG tablet Take 20 mg by mouth daily.   b complex vitamins capsule Take 1 capsule by mouth daily.   cetirizine (ZYRTEC) 10 MG tablet Take 10 mg by mouth 2 (two) times daily.   cholecalciferol  (VITAMIN D3) 25 MCG (1000 UNIT) tablet Take 1,000 Units by mouth daily.   ELIQUIS  2.5 MG TABS tablet Take 2.5 mg by mouth 2 (two) times daily.   empagliflozin  (JARDIANCE ) 10 MG TABS tablet Take 10 mg by mouth daily.   furosemide  (LASIX ) 40 MG tablet Take 40 mg by mouth daily.   gabapentin  (NEURONTIN ) 100 MG capsule Take 100 mg by mouth 3 (three) times daily.   halobetasol (ULTRAVATE) 0.05 % cream Apply 1 Application topically 2 (two) times daily as needed.   hydrocortisone 2.5 % ointment Apply 1 Application topically 2 (two) times daily as needed. Use twice daily for 1 week, take 1 week break, then resume. Only use up to 2 weeks per month.   hydrOXYzine  (ATARAX ) 10 MG tablet Take 10 mg by mouth every 8 (eight) hours as needed for itching.   levothyroxine  (SYNTHROID ) 137 MCG tablet Take 137 mcg by mouth in the morning.   lidocaine  (LIDODERM ) 5 % Place 1 patch onto the skin every 12 (twelve) hours. Remove & Discard patch within 12 hours or as directed by MD   methocarbamol  (ROBAXIN ) 500 MG tablet Take 1 tablet (500 mg total) by mouth every 8 (eight) hours as needed.   metoprolol  tartrate (LOPRESSOR ) 25 MG tablet Take 12.5 mg by mouth 2 (two) times daily. Hold for SBP <110   Multiple Vitamin (MULTIVITAMIN WITH MINERALS) TABS tablet Take 1 tablet by mouth daily.   permethrin (ELIMITE) 5 % cream Apply 1 Application  topically 2 (two) times a week. Apply 1 application topical, once a day on Monday, Thursday   potassium chloride  (KLOR-CON ) 10 MEQ tablet Take 10 mEq by mouth daily.   sertraline  (ZOLOFT ) 25 MG tablet Take 25 mg by mouth daily.   traZODone  (DESYREL ) 100 MG tablet Take 100 mg by mouth at bedtime.   triamcinolone  ointment (KENALOG ) 0.1 % Apply 1 Application topically at bedtime. Apply topically at bedtime. Soak in tub for 10-15 min in lukewarm water , get out and drip dry before applying ointment to all itchy areas.   No facility-administered encounter medications on file as of 04/30/2024.    Social History: Social History   Tobacco Use   Smoking status:  Former    Current packs/day: 0.00    Types: Cigarettes    Start date: 01/23/1990    Quit date: 01/24/2016    Years since quitting: 8.2   Smokeless tobacco: Never  Vaping Use   Vaping status: Never Used  Substance Use Topics   Alcohol use: Yes    Alcohol/week: 12.0 - 14.0 standard drinks of alcohol    Types: 12 - 14 Standard drinks or equivalent per week    Comment: 1-2 drink per night prior to SNF   Drug use: No    Family Medical History: Family History  Problem Relation Age of Onset   Diabetes Mother    Hyperlipidemia Father     Physical Examination: Vitals:   04/30/24 1026  BP: 116/72      Awake, alert, oriented to person, place, and time.  Speech is clear and fluent. Fund of knowledge is appropriate.   Cranial Nerves: Pupils equal round and reactive to light.  Facial tone is symmetric.    Mild tenderness at TL junction.   No abnormal lesions on exposed skin.   Strength: Side Biceps Triceps Deltoid Interossei Grip Wrist Ext. Wrist Flex.  R 5 5 5 5 5 5 5   L 5 5 5 5 5 5 5    Side Iliopsoas Quads Hamstring PF DF EHL  R 5 5 5 5 5 5   L 5 5 5 5 5 5    Reflexes are 1+ and symmetric at the biceps, brachioradialis, patella and achilles.   Hoffman's is absent.  Clonus is not present.   Bilateral upper and lower extremity  sensation is intact to light touch, slightly diminished in right LE per patient.   No pain with IR/ER of hips.   She ambulates with walker.   Medical Decision Making  Imaging: Lumbar xrays dated 04/30/24:  Slip L4-L5, compression fracture noted at L1.   I have personally reviewed the images and agree with the above interpretation.  Assessment and Plan: Ms. Coomes had a fall around 04/23/24 with increased LBP. No leg pain.   Xrays from today show L1 compression fracture.   Treatment options discussed with patient and following plan made:   - Patient and her son are interested in possible kyphoplasty procedure.  - Will order lumbar MRI to further evaluate compression fracture.  - She will need premeds for MRI. Son to call once scheduled.  - She is continue with TLSO brace when up and out of bed. Can remove to sleep. Can remove if sitting and watching TV.  - No bending, twisting, or lifting.  - Will plan to call son Diane Chambers with MRI results. Okay to do this per patient.   I spent a total of 35 minutes in face-to-face and non-face-to-face activities related to this patient's care today including review of outside records, review of imaging, review of symptoms, physical exam, discussion of differential diagnosis, discussion of treatment options, and documentation.   Thank you for involving me in the care of this patient.   Glade Boys PA-C Dept. of Neurosurgery

## 2024-04-26 NOTE — Telephone Encounter (Signed)
 Patient is scheduled to see St[facility] on 04/30/24 for lumbar fracture-no imaging in the system. I called [facility] and spoke with Lisa-RN-she stated imaging was done most likely with [facility] that comes to their facility. I asked for [facility] to look into getting imaging report fax to us  and how can we get the actual images if at all possible. [facility] said she would check in on this and call us  back.  Routing to [facility] as FYI also

## 2024-04-26 NOTE — Telephone Encounter (Signed)
 She will still need updated lumbar AP/lat xrays when she sees me (even if we can get the images from SNF).   Thanks!

## 2024-04-26 NOTE — Telephone Encounter (Signed)
 noted

## 2024-04-29 NOTE — Telephone Encounter (Signed)
 Made several attempts to follow up on getting Xray report and not able to reach anyone at [facility] about this still.

## 2024-04-30 ENCOUNTER — Telehealth: Payer: Self-pay | Admitting: Orthopedic Surgery

## 2024-04-30 ENCOUNTER — Ambulatory Visit

## 2024-04-30 ENCOUNTER — Ambulatory Visit: Admitting: Orthopedic Surgery

## 2024-04-30 ENCOUNTER — Encounter: Payer: Self-pay | Admitting: Orthopedic Surgery

## 2024-04-30 VITALS — BP 116/72 | Ht 66.0 in | Wt 105.0 lb

## 2024-04-30 DIAGNOSIS — S32010A Wedge compression fracture of first lumbar vertebra, initial encounter for closed fracture: Secondary | ICD-10-CM

## 2024-04-30 DIAGNOSIS — S32030A Wedge compression fracture of third lumbar vertebra, initial encounter for closed fracture: Secondary | ICD-10-CM

## 2024-04-30 MED ORDER — DIAZEPAM 2 MG PO TABS: ORAL_TABLET | ORAL | 0 refills | Status: DC

## 2024-04-30 NOTE — Telephone Encounter (Signed)
 Called [place], and left a detailed message with this information.

## 2024-04-30 NOTE — Patient Instructions (Signed)
 It was so nice to see you today. Thank you so much for coming in.    You have a compression fracture (broken bone at L1).    I want to get an MRI of your lower back to look into things further. We will get this approved through your insurance and [facility] will call you to schedule the appointment. Ask about your patient responsibility. You do not need to pay this prior to getting MRI, they can bill you.   [facility] is located at [address] in [place]. This is near the intersection of [address] and [address].   Once MRI is scheduled, let me know and I can call in medication to help get through the MRI.   After you have the MRI, it can take 14-28 days for me to get the results back. If I don't have them in 2 weeks, we will call to try to get the results.   Once I have the results, we will call you to schedule a follow up phone visit with me to review them.   Wear TLSO brace when up and out of bed. Can remove to sleep. Can remove if sitting and watching TV.   No bending, twisting, or lifting.   Depending on MRI results, will likely consider referral to IR for kyphoplasty procedure.   Please do not hesitate to call if you have any questions or concerns. You can also message me in MyChart.   Glade Boys PA-C [phone]     The physicians and staff at [facility] at [facility] are committed to providing excellent care. You may receive a survey asking for feedback about your experience at our office. We value you your feedback and appreciate you taking the time to to fill it out. The [facility] leadership team is also available to discuss your experience in person, feel free to contact us  [phone].

## 2024-04-30 NOTE — Telephone Encounter (Signed)
 Valium  sent to [facility]. This will likely make her sleepy.   Please let her son know.

## 2024-04-30 NOTE — Telephone Encounter (Signed)
 Patient's son called and her MRI is scheduled  for 05/09/24 @ 4:00pm.  He stated that [facility] would call in a medication prior to her MRI.  A e-script needs to be called into [facility] and they will deliver it to her Assisted living facility.   Thank you!

## 2024-04-30 NOTE — Addendum Note (Signed)
 Addended by: Ardine Iacovelli W on: 04/30/2024 11:40 AM   Modules accepted: Orders

## 2024-05-04 ENCOUNTER — Emergency Department
Admission: EM | Admit: 2024-05-04 | Discharge: 2024-05-05 | Disposition: A | Discharge status: Home / Self Care | Attending: Emergency Medicine | Admitting: Emergency Medicine

## 2024-05-04 ENCOUNTER — Encounter: Payer: Self-pay | Admitting: *Deleted

## 2024-05-04 ENCOUNTER — Emergency Department

## 2024-05-04 ENCOUNTER — Other Ambulatory Visit: Payer: Self-pay

## 2024-05-04 DIAGNOSIS — M79672 Pain in left foot: Secondary | ICD-10-CM

## 2024-05-04 DIAGNOSIS — Y92129 Unspecified place in nursing home as the place of occurrence of the external cause: Secondary | ICD-10-CM | POA: Insufficient documentation

## 2024-05-04 DIAGNOSIS — W010XXA Fall on same level from slipping, tripping and stumbling without subsequent striking against object, initial encounter: Secondary | ICD-10-CM | POA: Diagnosis not present

## 2024-05-04 DIAGNOSIS — Z7901 Long term (current) use of anticoagulants: Secondary | ICD-10-CM | POA: Diagnosis not present

## 2024-05-04 DIAGNOSIS — S0990XA Unspecified injury of head, initial encounter: Secondary | ICD-10-CM | POA: Diagnosis present

## 2024-05-04 DIAGNOSIS — W19XXXA Unspecified fall, initial encounter: Secondary | ICD-10-CM

## 2024-05-04 DIAGNOSIS — I4891 Unspecified atrial fibrillation: Secondary | ICD-10-CM | POA: Diagnosis not present

## 2024-05-04 NOTE — ED Notes (Addendum)
 Assisted pt to the triage bathroom. Brought patient to room 18 and put pt on the monitor with a blood pressure cuff and pulse ox. Pulled pt up in the bed with assistance from [facility] the NT. Pt has family member by bedside. RN notified pt was in the room.

## 2024-05-04 NOTE — ED Triage Notes (Signed)
 Pt reports that she lost her balance and fell, she says that she was using her walker at the time because she is unsteady, she c/o left foot pain. She denies hitting her head or any neck pain. She reports she does have chronic back pain that is unchanged. She says that she has fallen several times over the past weeks A/O x 4.

## 2024-05-04 NOTE — ED Triage Notes (Signed)
 First nurse note: Pt presents via EMS from [place] c/o unwitnessed fall. Denies hitting head. Denies LOC. Pt on Eliquis . C/o left foot pain. EMS reports chronic back x4. And A&Ox4 per EMS.

## 2024-05-05 ENCOUNTER — Emergency Department

## 2024-05-05 DIAGNOSIS — M79672 Pain in left foot: Secondary | ICD-10-CM | POA: Diagnosis not present

## 2024-05-05 MED ORDER — ACETAMINOPHEN 500 MG PO TABS
1000.0000 mg | ORAL_TABLET | Freq: Once | ORAL | Status: AC
  Administered 2024-05-05: 1000 mg via ORAL
  Filled 2024-05-05: qty 2

## 2024-05-05 NOTE — Discharge Instructions (Signed)
Please take Tylenol and ibuprofen/Advil for your pain.  It is safe to take them together, or to alternate them every few hours.  Take up to 1000mg of Tylenol at a time, up to 4 times per day.  Do not take more than 4000 mg of Tylenol in 24 hours.  For ibuprofen, take 400-600 mg, 3 - 4 times per day.  

## 2024-05-05 NOTE — ED Notes (Signed)
 Fall risk bundle is currently in place.

## 2024-05-05 NOTE — ED Provider Notes (Signed)
 [facility] Provider Note    Event Date/Time   First MD Initiated Contact with Patient 05/04/24 2335     (approximate)   History   Fall   HPI  Diane Chambers is a 89 y.o. female who presents to the ED for evaluation of Fall   I reviewed medical DC summary from August.  A-fib on Eliquis , diastolic dysfunction, resides at a local SNF.  I saw her 2 weeks ago after a fall causing a lumbar compression fracture and I recall her distinctly.  Discharged with TLSO brace.  Patient presents to the ED, companied by her son, for evaluation of left foot pain after a fall.  She reports frequent falls, slipped today and had a mechanical fall causing left foot injury.  Does not think she hit her head but has some uncertainty.  Denies syncope though   Physical Exam   Triage Vital Signs: ED Triage Vitals [05/04/24 2139]  Encounter Vitals Group     BP (!) 157/93     Girls Systolic BP Percentile      Girls Diastolic BP Percentile      Boys Systolic BP Percentile      Boys Diastolic BP Percentile      Pulse Rate (!) 108     Resp 20     Temp 97.8 F (36.6 C)     Temp Source Oral     SpO2 97 %     Weight      Height      Head Circumference      Peak Flow      Pain Score      Pain Loc      Pain Education      Exclude from Growth Chart     Most recent vital signs: Vitals:   05/04/24 2343 05/05/24 0132  BP:  (!) 162/96  Pulse:  (!) 109  Resp:  18  Temp:  97.6 F (36.4 C)  SpO2: 97% 97%    General: Awake, no distress.  CV:  Good peripheral perfusion.  Resp:  Normal effort.  Abd:  No distention.  MSK:  No deformity noted.  Mild midfoot tenderness over the lateral and mid metatarsals of the left foot, no evidence of open injury, no swelling, no bruising or skin changes. Palpation of all 4 extremities otherwise without evidence of deformity, tenderness or trauma Neuro:  No focal deficits appreciated. Other:     ED Results / Procedures / Treatments    Labs (all labs ordered are listed, but only abnormal results are displayed) Labs Reviewed - No data to display  EKG   RADIOLOGY CT head interpreted by me without evidence of acute intracranial pathology CT cervical spine interpreted by me without evidence of fracture or dislocation Plain film of the left foot interpreted by me without evidence of fracture or dislocation  Official radiology report(s): CT Cervical Spine Wo Contrast Result Date: 05/05/2024 EXAM: CT CERVICAL SPINE WITHOUT CONTRAST 05/05/2024 12:43:10 AM TECHNIQUE: CT of the cervical spine was performed without the administration of intravenous contrast. Multiplanar reformatted images are provided for review. Automated exposure control, iterative reconstruction, and/or weight based adjustment of the mA/kV was utilized to reduce the radiation dose to as low as reasonably achievable. COMPARISON: None available. CLINICAL HISTORY: fall FINDINGS: BONES AND ALIGNMENT: No acute fracture or traumatic malalignment. DEGENERATIVE CHANGES: Multilevel severe left facet arthrosis. SOFT TISSUES: No prevertebral soft tissue swelling. LUNGS: Biapical pleuroparenchymal scarring. VASCULATURE: Bilateral carotid bifurcation atherosclerosis. IMPRESSION:  1. No acute findings. 2. Severe multilevel left-sided facet arthrosis. Electronically signed by: Franky Stanford MD 05/05/2024 12:53 AM EST RP Workstation: [id number]   CT HEAD WO CONTRAST ( ) Result Date: 05/05/2024 EXAM: CT HEAD WITHOUT 05/05/2024 12:43:10 AM TECHNIQUE: CT of the head was performed without the administration of intravenous contrast. Automated exposure control, iterative reconstruction, and/or weight based adjustment of the mA/kV was utilized to reduce the radiation dose to as low as reasonably achievable. COMPARISON: CT Head dated 12/29/2014. CLINICAL HISTORY: Fall on Eliquis . FINDINGS: BRAIN AND VENTRICLES: No acute intracranial hemorrhage. No mass effect or midline shift. No  extra-axial fluid collection. No evidence of acute infarct. No hydrocephalus. Chronic ischemic white matter changes. Atherosclerotic calcification of the carotid siphons. ORBITS: No acute abnormality. SINUSES AND MASTOIDS: No acute abnormality. SOFT TISSUES AND SKULL: No acute skull fracture. No acute soft tissue abnormality. IMPRESSION: 1. No acute intracranial abnormality. Electronically signed by: Franky Stanford MD 05/05/2024 12:48 AM EST RP Workstation: [id number]   DG Foot Complete Left Result Date: 05/04/2024 EXAM: 3 OR MORE VIEW(S) XRAY OF THE LEFT FOOT 05/04/2024 10:12:16 PM COMPARISON: None available. CLINICAL HISTORY: left foot pain after fall FINDINGS: BONES AND JOINTS: Small calcaneal spur is noted. Tarsal degenerative changes are seen. No acute fracture or dislocation is noted. SOFT TISSUES: The soft tissues are unremarkable. IMPRESSION: 1. Mild degenerative change without acute abnormality. Electronically signed by: Oneil Devonshire MD 05/04/2024 10:15 PM EST RP Workstation: [id number]    PROCEDURES and INTERVENTIONS:  Procedures  Medications  acetaminophen  (TYLENOL ) tablet 1,000 mg (1,000 mg Oral Given 05/05/24 0029)     IMPRESSION / MDM / ASSESSMENT AND PLAN / ED COURSE  I reviewed the triage vital signs and the nursing notes.  Differential diagnosis includes, but is not limited to, fracture, dislocation, polytrauma, ICH  {Patient presents with symptoms of an acute illness or injury that is potentially life-threatening.  Patient presents with left foot pain after mechanical fall with a benign workup and suitable for return to facility.  Well-appearing to me and minimal tenderness on exam, otherwise normal exam.  Imaging is reassuring, no barriers to outpatient management at this point.  Clinical Course as of 05/05/24 0155  Sun May 05, 2024  0130 Reassessed discussed reassuring CT scans, plan of care.  Patient's son comfortable with her going back to facility which I think is  reasonable.  Will require transport [DS]    Clinical Course User Index [DS] Claudene Rover, MD     FINAL CLINICAL IMPRESSION(S) / ED DIAGNOSES   Final diagnoses:  Fall, initial encounter  Left foot pain     Rx / DC Orders   ED Discharge Orders     None        Note:  This document was prepared using Dragon voice recognition software and may include unintentional dictation errors.   Claudene Rover, MD 05/05/24 [phone]

## 2024-05-09 ENCOUNTER — Inpatient Hospital Stay
Admission: RE | Admit: 2024-05-09 | Discharge: 2024-05-09 | Attending: Orthopedic Surgery | Admitting: Orthopedic Surgery

## 2024-05-09 DIAGNOSIS — S32010A Wedge compression fracture of first lumbar vertebra, initial encounter for closed fracture: Secondary | ICD-10-CM

## 2024-05-18 ENCOUNTER — Emergency Department

## 2024-05-18 ENCOUNTER — Other Ambulatory Visit: Payer: Self-pay

## 2024-05-18 ENCOUNTER — Inpatient Hospital Stay
Admission: EM | Admit: 2024-05-18 | Discharge: 2024-05-21 | DRG: 194 | Disposition: A | Discharge status: Skilled Nursing Facility | Attending: Hospitalist | Admitting: Hospitalist

## 2024-05-18 DIAGNOSIS — J101 Influenza due to other identified influenza virus with other respiratory manifestations: Principal | ICD-10-CM | POA: Diagnosis present

## 2024-05-18 DIAGNOSIS — Z79899 Other long term (current) drug therapy: Secondary | ICD-10-CM

## 2024-05-18 DIAGNOSIS — I4891 Unspecified atrial fibrillation: Secondary | ICD-10-CM | POA: Diagnosis present

## 2024-05-18 DIAGNOSIS — I4821 Permanent atrial fibrillation: Secondary | ICD-10-CM | POA: Diagnosis present

## 2024-05-18 DIAGNOSIS — I5030 Unspecified diastolic (congestive) heart failure: Secondary | ICD-10-CM

## 2024-05-18 DIAGNOSIS — E0580 Other thyrotoxicosis without thyrotoxic crisis or storm: Secondary | ICD-10-CM | POA: Diagnosis present

## 2024-05-18 DIAGNOSIS — Z7901 Long term (current) use of anticoagulants: Secondary | ICD-10-CM

## 2024-05-18 DIAGNOSIS — Z7984 Long term (current) use of oral hypoglycemic drugs: Secondary | ICD-10-CM | POA: Diagnosis not present

## 2024-05-18 DIAGNOSIS — Z833 Family history of diabetes mellitus: Secondary | ICD-10-CM | POA: Diagnosis not present

## 2024-05-18 DIAGNOSIS — E785 Hyperlipidemia, unspecified: Secondary | ICD-10-CM | POA: Diagnosis present

## 2024-05-18 DIAGNOSIS — Z974 Presence of external hearing-aid: Secondary | ICD-10-CM

## 2024-05-18 DIAGNOSIS — I4892 Unspecified atrial flutter: Secondary | ICD-10-CM | POA: Diagnosis present

## 2024-05-18 DIAGNOSIS — I10 Essential (primary) hypertension: Secondary | ICD-10-CM | POA: Diagnosis not present

## 2024-05-18 DIAGNOSIS — Z91011 Allergy to milk products, unspecified: Secondary | ICD-10-CM

## 2024-05-18 DIAGNOSIS — R296 Repeated falls: Secondary | ICD-10-CM | POA: Diagnosis present

## 2024-05-18 DIAGNOSIS — I5032 Chronic diastolic (congestive) heart failure: Secondary | ICD-10-CM | POA: Diagnosis present

## 2024-05-18 DIAGNOSIS — H9193 Unspecified hearing loss, bilateral: Secondary | ICD-10-CM | POA: Diagnosis present

## 2024-05-18 DIAGNOSIS — F32A Depression, unspecified: Secondary | ICD-10-CM | POA: Diagnosis present

## 2024-05-18 DIAGNOSIS — Z66 Do not resuscitate: Secondary | ICD-10-CM | POA: Diagnosis present

## 2024-05-18 DIAGNOSIS — Z9049 Acquired absence of other specified parts of digestive tract: Secondary | ICD-10-CM

## 2024-05-18 DIAGNOSIS — R9431 Abnormal electrocardiogram [ECG] [EKG]: Secondary | ICD-10-CM | POA: Diagnosis not present

## 2024-05-18 DIAGNOSIS — F0153 Vascular dementia, unspecified severity, with mood disturbance: Secondary | ICD-10-CM | POA: Diagnosis present

## 2024-05-18 DIAGNOSIS — S32039A Unspecified fracture of third lumbar vertebra, initial encounter for closed fracture: Secondary | ICD-10-CM | POA: Diagnosis present

## 2024-05-18 DIAGNOSIS — Z96653 Presence of artificial knee joint, bilateral: Secondary | ICD-10-CM | POA: Diagnosis present

## 2024-05-18 DIAGNOSIS — I959 Hypotension, unspecified: Secondary | ICD-10-CM | POA: Diagnosis not present

## 2024-05-18 DIAGNOSIS — K219 Gastro-esophageal reflux disease without esophagitis: Secondary | ICD-10-CM | POA: Diagnosis present

## 2024-05-18 DIAGNOSIS — Z79890 Hormone replacement therapy (postmenopausal): Secondary | ICD-10-CM | POA: Diagnosis not present

## 2024-05-18 DIAGNOSIS — Z87891 Personal history of nicotine dependence: Secondary | ICD-10-CM

## 2024-05-18 DIAGNOSIS — I110 Hypertensive heart disease with heart failure: Secondary | ICD-10-CM | POA: Diagnosis present

## 2024-05-18 DIAGNOSIS — I69354 Hemiplegia and hemiparesis following cerebral infarction affecting left non-dominant side: Secondary | ICD-10-CM

## 2024-05-18 DIAGNOSIS — Z961 Presence of intraocular lens: Secondary | ICD-10-CM | POA: Diagnosis present

## 2024-05-18 DIAGNOSIS — R000 Tachycardia, unspecified: Secondary | ICD-10-CM | POA: Diagnosis not present

## 2024-05-18 DIAGNOSIS — Z888 Allergy status to other drugs, medicaments and biological substances status: Secondary | ICD-10-CM

## 2024-05-18 DIAGNOSIS — G2581 Restless legs syndrome: Secondary | ICD-10-CM | POA: Diagnosis present

## 2024-05-18 HISTORY — DX: Unspecified atrial flutter: I48.92

## 2024-05-18 HISTORY — DX: Nonrheumatic mitral (valve) insufficiency: I34.0

## 2024-05-18 HISTORY — DX: Unspecified diastolic (congestive) heart failure: I50.30

## 2024-05-18 LAB — BASIC METABOLIC PANEL WITH GFR
Anion gap: 11 (ref 5–15)
BUN: 10 mg/dL (ref 8–23)
CO2: 26 mmol/L (ref 22–32)
Calcium: 9.3 mg/dL (ref 8.9–10.3)
Chloride: 98 mmol/L (ref 98–111)
Creatinine, Ser: 0.59 mg/dL (ref 0.44–1.00)
GFR, Estimated: >60 mL/min
Glucose, Bld: 127 mg/dL — ABNORMAL HIGH (ref 70–99)
Potassium: 3.9 mmol/L (ref 3.5–5.1)
Sodium: 135 mmol/L (ref 135–145)

## 2024-05-18 LAB — CBC
HCT: 41 % (ref 36.0–46.0)
Hemoglobin: 13.2 g/dL (ref 12.0–15.0)
MCH: 30.3 pg (ref 26.0–34.0)
MCHC: 32.2 g/dL (ref 30.0–36.0)
MCV: 94 fL (ref 80.0–100.0)
Platelets: 344 K/uL (ref 150–400)
RBC: 4.36 MIL/uL (ref 3.87–5.11)
RDW: 14.1 % (ref 11.5–15.5)
WBC: 8.5 K/uL (ref 4.0–10.5)
nRBC: 0 % (ref 0.0–0.2)

## 2024-05-18 LAB — TROPONIN T, HIGH SENSITIVITY
Troponin T High Sensitivity: 20 ng/L — ABNORMAL HIGH (ref 0–19)
Troponin T High Sensitivity: 17 ng/L (ref 0–19)

## 2024-05-18 LAB — RESP PANEL BY RT-PCR (RSV, FLU A&B, COVID)  RVPGX2
Influenza A by PCR: NEGATIVE
Influenza B by PCR: NEGATIVE
Resp Syncytial Virus by PCR: NEGATIVE
SARS Coronavirus 2 by RT PCR: NEGATIVE

## 2024-05-18 LAB — PRO BRAIN NATRIURETIC PEPTIDE: Pro Brain Natriuretic Peptide: 2820 pg/mL — ABNORMAL HIGH

## 2024-05-18 LAB — PHOSPHORUS: Phosphorus: 2.9 mg/dL (ref 2.5–4.6)

## 2024-05-18 LAB — MAGNESIUM: Magnesium: 2.1 mg/dL (ref 1.7–2.4)

## 2024-05-18 LAB — TSH: TSH: <0.1 u[IU]/mL — ABNORMAL LOW (ref 0.350–4.500)

## 2024-05-18 MED ORDER — GABAPENTIN 100 MG PO CAPS
100.0000 mg | ORAL_CAPSULE | Freq: Three times a day (TID) | ORAL | Status: DC
  Administered 2024-05-18 – 2024-05-21 (×10): 100 mg via ORAL
  Filled 2024-05-18 (×5): qty 1

## 2024-05-18 MED ORDER — ONDANSETRON HCL 4 MG/2ML IJ SOLN
4.0000 mg | Freq: Four times a day (QID) | INTRAMUSCULAR | Status: DC | PRN
  Administered 2024-05-19: 4 mg via INTRAVENOUS
  Filled 2024-05-18: qty 2

## 2024-05-18 MED ORDER — DILTIAZEM HCL 25 MG/5ML IV SOLN
10.0000 mg | Freq: Once | INTRAVENOUS | Status: AC
  Administered 2024-05-18: 10 mg via INTRAVENOUS
  Filled 2024-05-18: qty 5

## 2024-05-18 MED ORDER — SERTRALINE HCL 50 MG PO TABS
25.0000 mg | ORAL_TABLET | Freq: Every day | ORAL | Status: DC
  Administered 2024-05-18 – 2024-05-21 (×4): 25 mg via ORAL
  Filled 2024-05-18 (×2): qty 1

## 2024-05-18 MED ORDER — DILTIAZEM HCL-DEXTROSE 125-5 MG/125ML-% IV SOLN (PREMIX)
5.0000 mg/h | INTRAVENOUS | Status: DC
  Administered 2024-05-18: 5 mg/h via INTRAVENOUS
  Administered 2024-05-19: 10 mg/h via INTRAVENOUS
  Filled 2024-05-18 (×2): qty 125

## 2024-05-18 MED ORDER — ACETAMINOPHEN 325 MG PO TABS
650.0000 mg | ORAL_TABLET | Freq: Four times a day (QID) | ORAL | Status: DC | PRN
  Administered 2024-05-18 – 2024-05-20 (×3): 650 mg via ORAL
  Filled 2024-05-18 (×2): qty 2

## 2024-05-18 MED ORDER — ATORVASTATIN CALCIUM 20 MG PO TABS
20.0000 mg | ORAL_TABLET | Freq: Every day | ORAL | Status: DC
  Administered 2024-05-18 – 2024-05-21 (×4): 20 mg via ORAL
  Filled 2024-05-18 (×2): qty 1

## 2024-05-18 MED ORDER — ACETAMINOPHEN 500 MG PO TABS
1000.0000 mg | ORAL_TABLET | Freq: Once | ORAL | Status: AC
  Administered 2024-05-18: 1000 mg via ORAL
  Filled 2024-05-18: qty 2

## 2024-05-18 MED ORDER — APIXABAN 2.5 MG PO TABS
2.5000 mg | ORAL_TABLET | Freq: Two times a day (BID) | ORAL | Status: DC
  Administered 2024-05-18 – 2024-05-21 (×6): 2.5 mg via ORAL
  Filled 2024-05-18 (×4): qty 1

## 2024-05-18 MED ORDER — HYDRALAZINE HCL 20 MG/ML IJ SOLN: 10.0000 mg | Freq: Four times a day (QID) | INTRAMUSCULAR | Status: DC | PRN

## 2024-05-18 MED ORDER — ONDANSETRON HCL 4 MG PO TABS: 4.0000 mg | ORAL_TABLET | Freq: Four times a day (QID) | ORAL | Status: DC | PRN

## 2024-05-18 MED ORDER — TRAZODONE HCL 100 MG PO TABS
100.0000 mg | ORAL_TABLET | Freq: Every day | ORAL | Status: DC
  Administered 2024-05-18 – 2024-05-20 (×3): 100 mg via ORAL
  Filled 2024-05-18 (×2): qty 1

## 2024-05-18 MED ORDER — LEVOTHYROXINE SODIUM 112 MCG PO TABS
112.0000 ug | ORAL_TABLET | Freq: Every day | ORAL | Status: DC
  Administered 2024-05-19: 112 ug via ORAL
  Filled 2024-05-18: qty 1

## 2024-05-18 MED ORDER — ACETAMINOPHEN 650 MG RE SUPP: 650.0000 mg | Freq: Four times a day (QID) | RECTAL | Status: DC | PRN

## 2024-05-18 NOTE — ED Triage Notes (Signed)
 Pt to ED via [facility] from home. Pt states HR high and irregular as well as cough. Pt states has Afib, had cardioversion at [facility] about 3-4 months ago, has recently moved to [facility]. Pt states only complaint at this time is HA. Pt states only a little SOB and CP when coughing.

## 2024-05-18 NOTE — ED Triage Notes (Signed)
 First nurse note: Pt to ED via [facility] from home. Pt reports CP when coughing. Sick contacts with son who is Flu +  123/79 99.3 oral HR 138

## 2024-05-18 NOTE — H&P (Signed)
 " History and Physical    Diane Chambers FMW:[id number] DOB: 1934-01-24 DOA: 05/18/2024  PCP: Pcp, No  Patient coming from: Home  I have personally briefly reviewed patient's old medical records in [facility]  Chief Complaint: Flulike symptoms since couple of days  HPI: Diane Chambers is a 89 y.o. female with medical history significant of A-fib on Eliquis , diastolic CHF, hypertension, hyperlipidemia, history of stroke with left-sided weakness, vascular dementia, hypothyroidism, carotid artery stenosis presented with flulike symptoms that has been going on since couple of days.  Patient reports some cough, chest pain with coughing, significant headache since couple of days.  Reports that her son and his wife tested positive for flu A couple of days ago.  Patient denies fever, chills, shortness of breath, palpitation, lightheadedness, dizziness, nausea, vomiting, diarrhea, abdominal pain however noticed that her heart rate have been significantly elevated.  She denies any presyncopal or syncopal episode.-Compliant with current medications.  Patient reports that she had 2 falls in last 1 month and sustained lumbar fracture.  Former smoker.  Drinks alcohol occasionally.  No illicit drug use.  ED Course: Upon arrival to ED: Patient afebrile, pulse 138, RR: 18, BP 145/94, maintaining oxygen saturation on room air.  COVID flu RSV negative.  CBC shows no leukocytosis.  NA: 135, K: 3.9, BG: 127.  Normal kidney function.  Troponin 20.  Chest x-ray negative for pneumonia.  Patient was found to have A-fib with RVR.  Was given Tylenol  and was started on Cardizem  gtt.  Tried hospitalist consulted for admission.  Review of Systems: As per HPI otherwise negative.    Past Medical History:  Diagnosis Date   Age-related macular degeneration, wet, right eye (HCC)    Carotid artery stenosis, asymptomatic, bilateral    last duplex in epic 02-18-2015  bilateral ICA <50%   Chronically dry eyes    Fracture  01/24/2018   right foot-neck of fifth metatarsal    GERD (gastroesophageal reflux disease)    Hiatal hernia    History of colitis 12/16/2016   secondary to champylobacter   History of recurrent TIAs    total x4  last one 12-29-2014---  (01-23-2018 per pt no residual's)   History of syncope    11/ 2018 approx. , cardiac work-up done by dr ladona    Hypertension    Hypothyroidism    followed by pcp   Lower urinary tract symptoms (LUTS)    OA (osteoarthritis)    knees   RLS (restless legs syndrome)    Venous stasis    01-23-2018 per pt elevates feet when sitting , if not toes become numb   Wears glasses    Wears hearing aid in right ear     Past Surgical History:  Procedure Laterality Date   APPENDECTOMY  AGE 84   CARDIOVASCULAR STRESS TEST  04-28-2017  dr ladona   Low risk nuclear study w/ small sized , mild intensity, reversible apical, inferoapical perfusion defect , no ischemia/  normal LV function and wall motion , EF 75%   CATARACT EXTRACTION W/ INTRAOCULAR LENS  IMPLANT, BILATERAL  2004;  2006   DILATION AND CURETTAGE OF UTERUS  YRS AGO   X3   EYE SURGERY Right 2011 APPROX.   CORNEA (FOR EROSION)   FOOT SURGERY Right 2004   MORTON'S NEUROMA   FRACTURE SURGERY  TEEN   RIGHT ARM , PNNING   LAPAROSCOPIC CHOLECYSTECTOMY  2011  APPROX.   TEE WITHOUT CARDIOVERSION N/A 01/17/2024  Procedure: ECHOCARDIOGRAM, TRANSESOPHAGEAL;  Surgeon: Perla Evalene PARAS, MD;  Location: [facility] ORS;  Service: Cardiovascular;  Laterality: N/A;   TONSILLECTOMY  CHILD   TOTAL KNEE ARTHROPLASTY Right 2010   TOTAL KNEE ARTHROPLASTY Left 01/29/2018   Procedure: LEFT TOTAL KNEE ARTHROPLASTY;  Surgeon: Melodi Lerner, MD;  Location: [facility] ORS;  Service: Orthopedics;  Laterality: Left;   TRANSTHORACIC ECHOCARDIOGRAM  05-03-2017   dr ladona   moderate concentric LVH, ef 66%/  mild to moderate MR with moderate MV leaflet calcification with no stenosis/  mild to moderate TR/  PASP   TRIGGER FINGER RELEASE   2005   RIGHT THUMB     reports that she quit smoking about 8 years ago. Her smoking use included cigarettes. She started smoking about 34 years ago. She has never used smokeless tobacco. She reports current alcohol use of about 12.0 - 14.0 standard drinks of alcohol per week. She reports that she does not use drugs.  Allergies[1]  Family History  Problem Relation Age of Onset   Diabetes Mother    Hyperlipidemia Father     Prior to Admission medications  Medication Sig Start Date End Date Taking? Authorizing Provider  acetaminophen  (TYLENOL ) 500 MG tablet Take 2 tablets (1,000 mg total) by mouth every 6 (six) hours as needed. 04/23/24 04/23/25  Claudene Rover, MD  ascorbic acid  (VITAMIN C ) 500 MG tablet Take 500 mg by mouth daily.    [provider]  atorvastatin  (LIPITOR) 20 MG tablet Take 20 mg by mouth daily.    [provider]  b complex vitamins capsule Take 1 capsule by mouth daily.    [provider]  cetirizine (ZYRTEC) 10 MG tablet Take 10 mg by mouth 2 (two) times daily.    [provider]  cholecalciferol  (VITAMIN D3) 25 MCG (1000 UNIT) tablet Take 1,000 Units by mouth daily.    [provider]  diazepam  (VALIUM ) 2 MG tablet Take 1 po 30 minutes prior to MRI scan. This will make you sleepy and you will need a driver. 04/30/24   Hilma Hastings, PA-C  ELIQUIS  2.5 MG TABS tablet Take 2.5 mg by mouth 2 (two) times daily. 10/02/23   [provider]  empagliflozin  (JARDIANCE ) 10 MG TABS tablet Take 10 mg by mouth daily.    [provider]  furosemide  (LASIX ) 40 MG tablet Take 40 mg by mouth daily.    [provider]  gabapentin  (NEURONTIN ) 100 MG capsule Take 100 mg by mouth 3 (three) times daily. 09/28/23   [provider]  halobetasol (ULTRAVATE) 0.05 % cream Apply 1 Application topically 2 (two) times daily as needed.    [provider]  hydrocortisone 2.5 % ointment Apply 1 Application topically 2 (two)  times daily as needed. Use twice daily for 1 week, take 1 week break, then resume. Only use up to 2 weeks per month. 09/27/23   [provider]  hydrOXYzine  (ATARAX ) 10 MG tablet Take 10 mg by mouth every 8 (eight) hours as needed for itching.    [provider]  levothyroxine  (SYNTHROID ) 137 MCG tablet Take 137 mcg by mouth in the morning.    [provider]  lidocaine  (LIDODERM ) 5 % Place 1 patch onto the skin every 12 (twelve) hours. Remove & Discard patch within 12 hours or as directed by MD 04/23/24 04/23/25  Claudene Rover, MD  methocarbamol  (ROBAXIN ) 500 MG tablet Take 1 tablet (500 mg total) by mouth every 8 (eight) hours as needed. 04/23/24  Claudene Rover, MD  metoprolol  tartrate (LOPRESSOR ) 25 MG tablet Take 12.5 mg by mouth 2 (two) times daily. Hold for SBP <110 09/11/23   [provider]  Multiple Vitamin (MULTIVITAMIN WITH MINERALS) TABS tablet Take 1 tablet by mouth daily.    [provider]  permethrin (ELIMITE) 5 % cream Apply 1 Application topically 2 (two) times a week. Apply 1 application topical, once a day on Monday, Thursday    [provider]  potassium chloride  (KLOR-CON ) 10 MEQ tablet Take 10 mEq by mouth daily.    [provider]  sertraline  (ZOLOFT ) 25 MG tablet Take 25 mg by mouth daily.    [provider]  traZODone  (DESYREL ) 100 MG tablet Take 100 mg by mouth at bedtime. 09/29/23   [provider]  triamcinolone  ointment (KENALOG ) 0.1 % Apply 1 Application topically at bedtime. Apply topically at bedtime. Soak in tub for 10-15 min in lukewarm water , get out and drip dry before applying ointment to all itchy areas.    [provider]    Physical Exam: Vitals:   05/18/24 1410 05/18/24 1420 05/18/24 1430 05/18/24 1440  BP: (!) 138/92 (!) 148/94 (!) 153/86 (!) 147/90  Pulse: (!) 138 (!) 134 (!) 141 (!) 137  Resp: 19 18 (!) 23 18  Temp:      TempSrc:      SpO2: 97% 96% 97% 97%  Weight:       Height:        Constitutional: NAD, calm, comfortable, on room air, son at the bedside.  Elderly female lying comfortably on the bed.  Hard of hearing Eyes: PERRL, lids and conjunctivae normal ENMT: Mucous membranes are moist. Posterior pharynx clear of any exudate or lesions.Normal dentition.  Neck: normal, supple, no masses, no thyromegaly Respiratory: clear to auscultation bilaterally, no wheezing, no crackles. Normal respiratory effort. No accessory muscle use.  Cardiovascular: Significantly tachycardic.  No rubs or gallop.  Bilateral 2+ pitting edema positive. Abdomen: no tenderness, no masses palpated. No hepatosplenomegaly. Bowel sounds positive.  Musculoskeletal: no clubbing / cyanosis. No joint deformity upper and lower extremities. Good ROM, no contractures. Normal muscle tone.  Skin: no rashes, lesions, ulcers. No induration Neurologic: CN 2-12 grossly intact. Sensation intact, DTR normal. Strength 5/5 in all 4.  Psychiatric: Normal judgment and insight. Alert and oriented x 3. Normal mood.    Labs on Admission: I have personally reviewed following labs and imaging studies  CBC: Recent Labs  Lab 05/18/24 1139  WBC 8.5  HGB 13.2  HCT 41.0  MCV 94.0  PLT 344   Basic Metabolic Panel: Recent Labs  Lab 05/18/24 1139  NA 135  K 3.9  CL 98  CO2 26  GLUCOSE 127*  BUN 10  CREATININE 0.59  CALCIUM  9.3   GFR: Estimated Creatinine Clearance: 36.5 mL/min (by C-G formula based on SCr of 0.59 mg/dL). Liver Function Tests: No results for input(s): AST, ALT, ALKPHOS, BILITOT, PROT, ALBUMIN in the last 168 hours. No results for input(s): LIPASE, AMYLASE in the last 168 hours. No results for input(s): AMMONIA in the last 168 hours. Coagulation Profile: No results for input(s): INR, PROTIME in the last 168 hours. Cardiac Enzymes: No results for input(s): CKTOTAL, CKMB, CKMBINDEX, TROPONINI in the last 168 hours. BNP (last 3 results) No  results for input(s): PROBNP in the last 8760 hours. HbA1C: No results for input(s): HGBA1C in the last 72 hours. CBG: No results for input(s): GLUCAP in the last 168 hours. Lipid Profile:  No results for input(s): CHOL, HDL, LDLCALC, TRIG, CHOLHDL, LDLDIRECT in the last 72 hours. Thyroid  Function Tests: No results for input(s): TSH, T4TOTAL, FREET4, T3FREE, THYROIDAB in the last 72 hours. Anemia Panel: No results for input(s): VITAMINB12, FOLATE, FERRITIN, TIBC, IRON, RETICCTPCT in the last 72 hours. Urine analysis:    Component Value Date/Time   COLORURINE YELLOW (A) 01/17/2024 0039   APPEARANCEUR CLOUDY (A) 01/17/2024 0039   LABSPEC 1.013 01/17/2024 0039   PHURINE 6.0 01/17/2024 0039   GLUCOSEU >=500 (A) 01/17/2024 0039   HGBUR NEGATIVE 01/17/2024 0039   BILIRUBINUR NEGATIVE 01/17/2024 0039   KETONESUR NEGATIVE 01/17/2024 0039   PROTEINUR NEGATIVE 01/17/2024 0039   UROBILINOGEN 0.2 12/29/2014 1241   NITRITE NEGATIVE 01/17/2024 0039   LEUKOCYTESUR LARGE (A) 01/17/2024 0039    Radiological Exams on Admission: DG Chest 2 View Result Date: 05/18/2024 CLINICAL DATA:  Chest pain when coughing. EXAM: CHEST - 2 VIEW COMPARISON:  January 14, 2024 FINDINGS: The medial aspect of the mid and lower left lung is limited in evaluation on the frontal view secondary to overlying external hardware. The heart size and mediastinal contours are within normal limits. The lungs are hyperinflated with mild, chronic appearing increased lung markings. Mild biapical pleural thickening is suspected. Radiopaque surgical clips are seen overlying the right lung base. No acute infiltrate, pleural effusion or pneumothorax is identified. Multilevel degenerative changes are present throughout the thoracic spine. IMPRESSION: Chronic appearing increased lung markings without evidence of acute or active cardiopulmonary disease. Electronically Signed   By: Suzen Dials M.D.    On: 05/18/2024 13:35    EKG: Independently reviewed.  Atrial flutter with heart rate of 138  Assessment/Plan  A-fib with RVR: - Likely in the setting of recent viral infection.  COVID flu RSV negative.  Chest x-ray negative.  Has history of A-fib on Eliquis  at home. - Received Cardizem  in ED. - Admit patient on the floor on telemetry.  Continue Cardizem  gtt.  Continue Eliquis  - Monitor vitals closely.  Check electrolytes.   -Consult cardiology   Flulike symptoms: - Tested negative for COVID flu RSV.  Check respiratory panel.  Chest x-ray negative. - Currently on room air.  Continue to monitor  Elevated troponin: - Likely in the setting of A-fib with RVR. - Trend troponin.  Recurrent fall Vertebral fracture: - Reviewed MRI from 05/09/2024 that showed acute to subacute sacral fracture at S2 and S3, chronic L1 compression fracture.  DDD changes L3-L4.   -Will consult PT  Chronic diastolic CHF: - Check BNP.  Last echo done 01/17/2024 that showed EF of 55 to 60%.  Will continue home medications furosemide , Jardiance .  Strict INO's and daily weight.  Hypothyroidism: Check TSH.  Continue current dose of levothyroxine   TIA: Continue Eliquis  and Lipitor  Hypertension: - On metoprolol  at home.  Hydralazine  as needed  Depression/anxiety: Continue current dose of sertraline  and trazodone   Hyperlipidemia: Continue statin  Unable to resume patient's home medications once med rec pending per pharmacy  DVT prophylaxis: Eliquis  Code Status: DNR-confirmed with the patient and her son at the bedside  family Communication: Patient's son present at bedside.  Plan of care discussed with patient in length and she verbalized understanding and agreed with it. Disposition Plan: To be determined Consults called: Cardiology Admission status: Inpatient   Velna JONELLE Skeeter MD [facility]  If 7PM-7AM, please contact night-coverage [email or web address]  05/18/2024, 3:13 PM        [1]   Allergies Allergen Reactions   Calcium -Containing Compounds  Other (See Comments)    Restless legs   Lactose Intolerance (Gi) Diarrhea   "

## 2024-05-18 NOTE — ED Notes (Signed)
 Pt taken to bathroom by this tech, pt able to stand and pivot from wheelchair to toilet

## 2024-05-18 NOTE — ED Notes (Signed)
 Pt presents with back brace on due to back fx from several falls. Pt states would need procedure to cement spine.

## 2024-05-18 NOTE — ED Provider Notes (Signed)
 "  [facility] Provider Note    Event Date/Time   First MD Initiated Contact with Patient 05/18/24 1230     (approximate)   History   Shortness of Breath and Cough   HPI  Diane Chambers is a 89 y.o. female with a history of atrial fibrillation on Eliquis , CHF, sepsis who presents with complaints of shortness of breath, cough, fatigue     Physical Exam   Triage Vital Signs: ED Triage Vitals  Encounter Vitals Group     BP 05/18/24 1131 (!) 145/94     Girls Systolic BP Percentile --      Girls Diastolic BP Percentile --      Boys Systolic BP Percentile --      Boys Diastolic BP Percentile --      Pulse Rate 05/18/24 1131 (!) 138     Resp 05/18/24 1131 18     Temp 05/18/24 1131 98.8 F (37.1 C)     Temp Source 05/18/24 1131 Oral     SpO2 05/18/24 1131 94 %     Weight 05/18/24 1124 47.6 kg (105 lb)     Height 05/18/24 1124 1.676 m (5' 6)     Head Circumference --      Peak Flow --      Pain Score 05/18/24 1124 5     Pain Loc --      Pain Education --      Exclude from Growth Chart --     Most recent vital signs: Vitals:   05/18/24 1430 05/18/24 1440  BP: (!) 153/86 (!) 147/90  Pulse: (!) 141 (!) 137  Resp: (!) 23 18  Temp:    SpO2: 97% 97%     General: Awake, no distress.  CV:  Good peripheral perfusion.  Tachycardia, regular rhythm Resp:  Normal effort.  Clear to auscultation bilaterally Abd:  No distention.  Soft, nontender Other:  No calf pain or swelling   ED Results / Procedures / Treatments   Labs (all labs ordered are listed, but only abnormal results are displayed) Labs Reviewed  BASIC METABOLIC PANEL WITH GFR - Abnormal; Notable for the following components:      Result Value   Glucose, Bld 127 (*)    All other components within normal limits  TROPONIN T, HIGH SENSITIVITY - Abnormal; Notable for the following components:   Troponin T High Sensitivity 20 (*)    All other components within normal limits  RESP PANEL BY  RT-PCR (RSV, FLU A&B, COVID)  RVPGX2  CBC  MAGNESIUM   PHOSPHORUS  TSH  TROPONIN T, HIGH SENSITIVITY     EKG  ED ECG REPORT I, Lamar Price, the attending physician, personally viewed and interpreted this ECG.  Date: 05/18/2024  Rhythm: Atrial fibrillation with RVR at QRS Axis: normal Intervals: Normal ST/T Wave abnormalities: normal Narrative Interpretation: no evidence of acute ischemia    RADIOLOGY Chest x-ray without evidence of pneumonia    PROCEDURES:  Critical Care performed: yes  CRITICAL CARE Performed by: Lamar Price   Total critical care time: 30 minutes  Critical care time was exclusive of separately billable procedures and treating other patients.  Critical care was necessary to treat or prevent imminent or life-threatening deterioration.  Critical care was time spent personally by me on the following activities: development of treatment plan with patient and/or surrogate as well as nursing, discussions with consultants, evaluation of patient's response to treatment, examination of patient, obtaining history from patient or  surrogate, ordering and performing treatments and interventions, ordering and review of laboratory studies, ordering and review of radiographic studies, pulse oximetry and re-evaluation of patient's condition.   Procedures   MEDICATIONS ORDERED IN ED: Medications  diltiazem  (CARDIZEM ) 125 mg in dextrose  5% 125 mL (1 mg/mL) infusion (has no administration in time range)  acetaminophen  (TYLENOL ) tablet 650 mg (has no administration in time range)    Or  acetaminophen  (TYLENOL ) suppository 650 mg (has no administration in time range)  ondansetron  (ZOFRAN ) tablet 4 mg (has no administration in time range)    Or  ondansetron  (ZOFRAN ) injection 4 mg (has no administration in time range)  hydrALAZINE  (APRESOLINE ) injection 10 mg (has no administration in time range)  acetaminophen  (TYLENOL ) tablet 1,000 mg (1,000 mg Oral Given  05/18/24 1401)  diltiazem  (CARDIZEM ) injection 10 mg (10 mg Intravenous Given 05/18/24 1402)     IMPRESSION / MDM / ASSESSMENT AND PLAN / ED COURSE  I reviewed the triage vital signs and the nursing notes. Patient's presentation is most consistent with acute presentation with potential threat to life or bodily function.  Patient presents with symptoms as above, found to be quite tachycardic and EKG confirms atrial fibrillation with RVR.  Pending labs, will give a dose of IV Cardizem   Chest x-ray without evidence of pneumonia  Lab work demonstrates mildly elevated troponin likely related to increased heart rate  Initial improvement in heart rate but it is increased again, will start Cardizem  drip  Have discussed with the hospitalist for admission        FINAL CLINICAL IMPRESSION(S) / ED DIAGNOSES   Final diagnoses:  Atrial fibrillation with rapid ventricular response (HCC)     Rx / DC Orders   ED Discharge Orders     None        Note:  This document was prepared using Dragon voice recognition software and may include unintentional dictation errors.   Arlander Charleston, MD 05/18/24 1520  "

## 2024-05-19 ENCOUNTER — Encounter: Payer: Self-pay | Admitting: Internal Medicine

## 2024-05-19 ENCOUNTER — Inpatient Hospital Stay (HOSPITAL_COMMUNITY): Admit: 2024-05-19 | Discharge: 2024-05-19 | Disposition: A | Discharge status: Home / Self Care

## 2024-05-19 DIAGNOSIS — I10 Essential (primary) hypertension: Secondary | ICD-10-CM

## 2024-05-19 DIAGNOSIS — I5030 Unspecified diastolic (congestive) heart failure: Secondary | ICD-10-CM | POA: Diagnosis not present

## 2024-05-19 DIAGNOSIS — E785 Hyperlipidemia, unspecified: Secondary | ICD-10-CM

## 2024-05-19 DIAGNOSIS — R000 Tachycardia, unspecified: Secondary | ICD-10-CM

## 2024-05-19 DIAGNOSIS — I4891 Unspecified atrial fibrillation: Secondary | ICD-10-CM

## 2024-05-19 DIAGNOSIS — R9431 Abnormal electrocardiogram [ECG] [EKG]: Secondary | ICD-10-CM | POA: Diagnosis not present

## 2024-05-19 DIAGNOSIS — I4892 Unspecified atrial flutter: Secondary | ICD-10-CM | POA: Diagnosis not present

## 2024-05-19 LAB — URINALYSIS, COMPLETE (UACMP) WITH MICROSCOPIC
Bacteria, UA: NONE SEEN
Bilirubin Urine: NEGATIVE
Glucose, UA: NEGATIVE mg/dL
Hgb urine dipstick: NEGATIVE
Ketones, ur: NEGATIVE mg/dL
Leukocytes,Ua: NEGATIVE
Nitrite: NEGATIVE
Protein, ur: NEGATIVE mg/dL
Specific Gravity, Urine: 1.009 (ref 1.005–1.030)
pH: 7 (ref 5.0–8.0)

## 2024-05-19 LAB — ECHOCARDIOGRAM COMPLETE
AR max vel: 1.56 cm2
AV Peak grad: 6.9 mmHg
Ao pk vel: 1.32 m/s
Height: 66 in
S' Lateral: 3.5 cm
Weight: 1680 [oz_av]

## 2024-05-19 LAB — RESPIRATORY PANEL BY PCR

## 2024-05-19 LAB — COMPREHENSIVE METABOLIC PANEL WITH GFR
ALT: 34 U/L (ref 0–44)
AST: 38 U/L (ref 15–41)
Albumin: 3.9 g/dL (ref 3.5–5.0)
Alkaline Phosphatase: 373 U/L — ABNORMAL HIGH (ref 38–126)
Anion gap: 10 (ref 5–15)
BUN: 10 mg/dL (ref 8–23)
CO2: 24 mmol/L (ref 22–32)
Calcium: 9 mg/dL (ref 8.9–10.3)
Chloride: 103 mmol/L (ref 98–111)
Creatinine, Ser: 0.65 mg/dL (ref 0.44–1.00)
GFR, Estimated: >60 mL/min
Glucose, Bld: 103 mg/dL — ABNORMAL HIGH (ref 70–99)
Potassium: 4 mmol/L (ref 3.5–5.1)
Sodium: 137 mmol/L (ref 135–145)
Total Bilirubin: 0.6 mg/dL (ref 0.0–1.2)
Total Protein: 7 g/dL (ref 6.5–8.1)

## 2024-05-19 MED ORDER — OSELTAMIVIR PHOSPHATE 30 MG PO CAPS
30.0000 mg | ORAL_CAPSULE | Freq: Two times a day (BID) | ORAL | Status: DC
  Administered 2024-05-19 – 2024-05-21 (×5): 30 mg via ORAL
  Filled 2024-05-19 (×2): qty 1

## 2024-05-19 MED ORDER — DIGOXIN 0.25 MG/ML IJ SOLN
0.2500 mg | Freq: Once | INTRAMUSCULAR | Status: AC
  Administered 2024-05-19: 0.25 mg via INTRAVENOUS
  Filled 2024-05-19: qty 2

## 2024-05-19 MED ORDER — SODIUM CHLORIDE 0.9 % IV BOLUS
250.0000 mL | Freq: Once | INTRAVENOUS | Status: AC
  Administered 2024-05-19: 250 mL via INTRAVENOUS

## 2024-05-19 MED ORDER — DILTIAZEM HCL 30 MG PO TABS
30.0000 mg | ORAL_TABLET | Freq: Four times a day (QID) | ORAL | Status: DC
  Administered 2024-05-19 – 2024-05-21 (×7): 30 mg via ORAL
  Filled 2024-05-19 (×2): qty 1

## 2024-05-19 MED ORDER — PROCHLORPERAZINE EDISYLATE 10 MG/2ML IJ SOLN
5.0000 mg | Freq: Four times a day (QID) | INTRAMUSCULAR | Status: DC | PRN
  Administered 2024-05-19: 5 mg via INTRAVENOUS
  Filled 2024-05-19: qty 2

## 2024-05-19 MED ORDER — METOPROLOL TARTRATE 25 MG PO TABS
25.0000 mg | ORAL_TABLET | Freq: Two times a day (BID) | ORAL | Status: DC
  Administered 2024-05-19 – 2024-05-21 (×5): 25 mg via ORAL
  Filled 2024-05-19 (×2): qty 1

## 2024-05-19 NOTE — ED Notes (Signed)
 Echo at bedside

## 2024-05-19 NOTE — ED Notes (Signed)
 Cardiologist at bedside.

## 2024-05-19 NOTE — ED Notes (Signed)
 Pharmacy messaged about pt missing medications.

## 2024-05-19 NOTE — ED Notes (Signed)
 Provided notified of abnormal BP reading (hypotensive) via secure chat.

## 2024-05-19 NOTE — Progress Notes (Signed)
 Echocardiogram 2D Echocardiogram has been performed.  Carrye Goller N Calhoun Reichardt,RDCS 05/19/2024, 8:28 AM

## 2024-05-19 NOTE — ED Notes (Signed)
 Patient given clean blankets and placed in a clean gown at this time.

## 2024-05-19 NOTE — ED Notes (Signed)
 Patient given orange juice

## 2024-05-19 NOTE — Consult Note (Signed)
 "  Cardiology Consult    Patient ID: Diane Chambers MRN: [id number], DOB/AGE: 88/29/1935   Admit date: 05/18/2024 Date of Consult: 05/19/2024  Primary Physician: Freddrick, No Primary Cardiologist: Evalene Lunger, MD   Requesting Provider: IVAR Haber, MD  Patient Profile    Diane Chambers is a 89 y.o. female with a history of persistent (?  Permanent) atrial fibrillation/flutter, chronic HFpEF, hypertension, hyperlipidemia, multiple strokes with left arm weakness, TIAs, vascular dementia, hypothyroidism, Staph aureus bacteremia (August 2025), colitis, carotid arterial disease, cervical and lumbar disc disease, degenerative joint disease, restless leg syndrome, mitral regurgitation, and GERD, who is being seen today for the evaluation of rapid atrial flutter in the setting of Flu A at the request of Dr. Haber.  Past Medical History   Subjective  Past Medical History:  Diagnosis Date   (HFpEF) heart failure with preserved ejection fraction (HCC)    a. 12/2014 Echo: EF 55-60%, grI DD; b. 12/2023 Echo: EF 65-70%, mild-mod MR/TR; c. 12/2023 TEE: EF 55-60%, no rwma, nl RV fxn, no LA/LAA thrombus, mild-mod MR, triv AI.   Age-related macular degeneration, wet, right eye (HCC)    Atrial flutter (HCC)    a. Noted 12/2023 - ? persistent vs permanent. CHA2DS2VASc = 8-->eliquis  2.5 (age/wt).   Carotid artery stenosis, asymptomatic, bilateral    last duplex in epic 02-18-2015  bilateral ICA <50%   Chronically dry eyes    Fracture 01/24/2018   right foot-neck of fifth metatarsal    GERD (gastroesophageal reflux disease)    Hiatal hernia    History of colitis 12/16/2016   secondary to champylobacter   History of recurrent TIAs    total x4  last one 12-29-2014---  (01-23-2018 per pt no residual's)   History of syncope    11/ 2018 approx. , cardiac work-up done by dr ladona    Hypertension    Hypothyroidism    followed by pcp   Lower urinary tract symptoms (LUTS)    Mitral regurgitation    a. 12/2023 TEE:  mild-mod MR.   OA (osteoarthritis)    knees   RLS (restless legs syndrome)    Venous stasis    01-23-2018 per pt elevates feet when sitting , if not toes become numb   Wears glasses    Wears hearing aid in right ear     Past Surgical History:  Procedure Laterality Date   APPENDECTOMY  AGE 47   CARDIOVASCULAR STRESS TEST  04-28-2017  dr ladona   Low risk nuclear study w/ small sized , mild intensity, reversible apical, inferoapical perfusion defect , no ischemia/  normal LV function and wall motion , EF 75%   CATARACT EXTRACTION W/ INTRAOCULAR LENS  IMPLANT, BILATERAL  2004;  2006   DILATION AND CURETTAGE OF UTERUS  YRS AGO   X3   EYE SURGERY Right 2011 APPROX.   CORNEA (FOR EROSION)   FOOT SURGERY Right 2004   [facility]ON'S NEUROMA   FRACTURE SURGERY  TEEN   RIGHT ARM , PNNING   LAPAROSCOPIC CHOLECYSTECTOMY  2011  APPROX.   TEE WITHOUT CARDIOVERSION N/A 01/17/2024   Procedure: ECHOCARDIOGRAM, TRANSESOPHAGEAL;  Surgeon: Lunger Evalene PARAS, MD;  Location: [facility] ORS;  Service: Cardiovascular;  Laterality: N/A;   TONSILLECTOMY  CHILD   TOTAL KNEE ARTHROPLASTY Right 2010   TOTAL KNEE ARTHROPLASTY Left 01/29/2018   Procedure: LEFT TOTAL KNEE ARTHROPLASTY;  Surgeon: Melodi Lerner, MD;  Location: [facility];  Service: Orthopedics;  Laterality: Left;   TRANSTHORACIC ECHOCARDIOGRAM  05-03-2017  dr ladona   moderate concentric LVH, ef 66%/  mild to moderate MR with moderate MV leaflet calcification with no stenosis/  mild to moderate TR/  PASP   TRIGGER FINGER RELEASE  2005   RIGHT THUMB     Allergies  Allergies[1]     History of Present Illness   89 year old female with a history of persistent (?  Permanent) atrial fibrillation/flutter, chronic HFpEF, hypertension, hyperlipidemia, multiple strokes with left arm weakness, TIAs, vascular dementia, hypothyroidism, colitis, carotid arterial disease, cervical and lumbar disc disease, degenerative joint disease, restless leg syndrome, mitral  regurgitation, and GERD.  Patient says that she was diagnosed with atrial fibrillation several years ago and maybe underwent a cardioversion at some point by a Dr. Delores in [facility], [place] .  She is unclear on the details and is intermittently disoriented to time and place at this time. In August 2025 she was admitted to [facility] with dyspnea, weakness, and anorexia.  She was febrile, hypoxic, and tachycardic with ECG showing rapid atrial flutter.  She was admitted and treated for acute bronchitis and bacteremia (blood cultures positive for staph aureus and nasal PCR positive for MRSA).  Respiratory panel was also positive for metapneumovirus.  She underwent TEE which showed normal LV and RV function with mild to moderate MR.  She is subsequently discharged on intravenous daptomycin  and oral doxycycline .  Atrial flutter appears to have been conservatively managed with rate control and oral anticoagulation.  She was seen in heart failure clinic in late September 2025 and was in rate controlled atrial flutter at 94 bpm.  She has not had routine outpatient general cardiology follow-up.  Ms. Barrales lives at a local skilled nursing facility.  She has had issues with falls and back pain as well as imaging in early December showing L3 fracture.  She was seen again in the emergency department December 14 secondary to fall with CT showing no acute fractures or intracranial abnormalities.  Patient says that over the past few days, she has been coughing and feeling fatigued.  She thought she was transferred to the emergency department due to elevated heart rates noted but her physical therapist though notes indicate that she was transferred on December 27, due to complaints of cough, pleuritic chest pain, and headache.  On arrival, she was afebrile at 98.8, tachycardic (138), and hypertensive at 145/94.  She was saturating at 94% on room air.  EKG showed atrial flutter at 138 bpm with inferolateral  ST segment depression, poor R wave progression, and approximate 1 mm ST elevation in V1 through V3.  Labs relatively unremarkable with H&H of 13.2/41.0, white count of 8.5, BUN/creatinine of 10/0.59.  Initial troponin mildly elevated at 20 with follow-up value of 17.  proBNP elevated at 2820.  Respiratory panel positive for influenza A chest x-ray with chronic appearing increased lung markings without evidence of acute or active cardiopulmonary disease.  She was given a 10 mg IV diltiazem  bolus followed by infusion, which was titrated to 10 mg/h but was subsequently discontinued in the setting of hypotension at approximately 1 AM this morning.  At that time, in the setting of ongoing tachycardia, she was given 0.25 mg of intravenous digoxin .  She was also given a total of 500 ml of normal saline boluses.  Pressure improved and she was placed back on intravenous diltiazem .  Rates of been trending in the low 100s.  She is completely asymptomatic at this time. Meds, FH, SH, ROS    Subjective  Inpatient Medications    apixaban   2.5 mg Oral BID   atorvastatin   20 mg Oral Daily   gabapentin   100 mg Oral TID   sertraline   25 mg Oral Daily   traZODone   100 mg Oral QHS    Family History    Family History  Problem Relation Age of Onset   Diabetes Mother    Hyperlipidemia Father    She indicated that the status of her mother is unknown. She indicated that the status of her father is unknown.   Social History    Social History   Socioeconomic History   Marital status: Widowed    Spouse name: Not on file   Number of children: Not on file   Years of education: Not on file   Highest education level: Not on file  Occupational History   Not on file  Tobacco Use   Smoking status: Former    Current packs/day: 0.00    Types: Cigarettes    Start date: 01/23/1990    Quit date: 01/24/2016    Years since quitting: 8.3   Smokeless tobacco: Never  Vaping Use   Vaping status: Never Used  Substance and  Sexual Activity   Alcohol use: Yes    Alcohol/week: 12.0 - 14.0 standard drinks of alcohol    Types: 12 - 14 Standard drinks or equivalent per week    Comment: 1-2 drink per night prior to SNF   Drug use: No   Sexual activity: Not on file  Other Topics Concern   Not on file  Social History Narrative   Not on file   Social Drivers of Health   Tobacco Use: Medium Risk (05/19/2024)   Patient History    Smoking Tobacco Use: Former    Smokeless Tobacco Use: Never    Passive Exposure: Not on Actuary Strain: Not on file  Food Insecurity: No Food Insecurity (01/16/2024)   Epic    Worried About Radiation Protection Practitioner of Food in the Last Year: Never true    Ran Out of Food in the Last Year: Never true  Transportation Needs: No Transportation Needs (01/16/2024)   Epic    Lack of Transportation (Medical): No    Lack of Transportation (Non-Medical): No  Physical Activity: Not on file  Stress: Not on file  Social Connections: Not on file  Intimate Partner Violence: Not on file  Depression (EYV7-0): Not on file  Alcohol Screen: Not on file  Housing: Unknown (01/16/2024)   Epic    Unable to Pay for Housing in the Last Year: No    Number of Times Moved in the Last Year: Not on file    Homeless in the Last Year: No  Utilities: Not on file  Health Literacy: Not on file     Review of Systems    General:  +++ General malaise.  No chills, fever, night sweats or weight changes.  Cardiovascular:  +++ Pleuritic chest pain, +++ dyspnea on exertion, no edema, orthopnea, palpitations, paroxysmal nocturnal dyspnea. Dermatological: No rash, lesions/masses Respiratory: +++ cough, +++ dyspnea Urologic: No hematuria, dysuria Abdominal:   No nausea, vomiting, diarrhea, bright red blood per rectum, melena, or hematemesis Neurologic:  No visual changes, wkns, changes in mental status. All other systems reviewed and are otherwise negative except as noted above.     Exam, Labs, Other  Objective    Physical Exam    Blood pressure (!) 168/96, pulse (!) 148, temperature 98.3 F (36.8 C), temperature source  Oral, resp. rate 19, height 5' 6 (1.676 m), weight 47.6 kg, SpO2 95%.  General: Pleasant, NAD Psych: Normal affect. Neuro: Disoriented to time and place. Moves all extremities spontaneously.  Left arm weakness. HEENT: Normal  Neck: Supple without bruits or JVD. Lungs:  Resp regular and unlabored, coarse breath sounds throughout. Heart: Irregularly irregular, 1/6 systolic murmur at the apex.   Abdomen: Soft, non-tender, non-distended, BS + x 4.  Extremities: No clubbing, cyanosis or edema. DP/PT2+, Radials 2+ and equal bilaterally.  Labs    Recent Labs  Lab 05/18/24 1139 05/18/24 1635  TRNPT 20* 17  ] BNP    Component Value Date/Time   BNP 293.3 (H) 01/14/2024 1536    ProBNP    Component Value Date/Time   PROBNP 2,820.0 (H) 05/18/2024 1139    Lab Results  Component Value Date   WBC 8.5 05/18/2024   HGB 13.2 05/18/2024   HCT 41.0 05/18/2024   MCV 94.0 05/18/2024   PLT 344 05/18/2024    Recent Labs  Lab 05/19/24 0614  NA 137  K 4.0  CL 103  CO2 24  BUN 10  CREATININE 0.65  CALCIUM  9.0  PROT 7.0  BILITOT 0.6  ALKPHOS 373*  ALT 34  AST 38  GLUCOSE 103*   Lab Results  Component Value Date   CHOL 215 (H) 12/30/2014   HDL 103 12/30/2014   LDLCALC 97 12/30/2014   TRIG 77 12/30/2014     Lab Results  Component Value Date   TSH <0.100 (L) 05/18/2024    Radiology Studies    DG Chest 2 View Result Date: 05/18/2024 CLINICAL DATA:  Chest pain when coughing. EXAM: CHEST - 2 VIEW COMPARISON:  January 14, 2024 FINDINGS: The medial aspect of the mid and lower left lung is limited in evaluation on the frontal view secondary to overlying external hardware. The heart size and mediastinal contours are within normal limits. The lungs are hyperinflated with mild, chronic appearing increased lung markings. Mild biapical pleural thickening is suspected.  Radiopaque surgical clips are seen overlying the right lung base. No acute infiltrate, pleural effusion or pneumothorax is identified. Multilevel degenerative changes are present throughout the thoracic spine. IMPRESSION: Chronic appearing increased lung markings without evidence of acute or active cardiopulmonary disease. Electronically Signed   By: Suzen Dials M.D.   On: 05/18/2024 13:35   MR LUMBAR SPINE WO CONTRAST Result Date: 05/16/2024 EXAM: MRI LUMBAR SPINE 05/09/2024 05:15:18 PM  IMPRESSION: 1. Acute to subacute sacral fractures partially visible; at the S2 and S3 in the midline and tracking to the left ala. Regional paraspinal muscle edema. 2. No recent traumatic injury identified in the lumbar spine. Chronic L1 compression fracture. 3. Lumbar spine degeneration and previous lower lumbar posterior decompression. Isolated mild lumbar spinal stenosis at L3-L4. Electronically signed by: Helayne Hurst MD 05/16/2024 07:02 AM EST RP Workstation: [id number]   DG Lumbar Spine 2-3 Views Result Date: 05/05/2024 EXAM: 2 or 3 VIEW(S) XRAY OF THE LUMBAR SPINE 04/30/2024 10:50:41 AM COMPARISON: CT abdomen and pelvis 01/03/2008. CLINICAL HISTORY: L3 compression fracture. FINDINGS:  IMPRESSION: 1. Mild chronic compression deformity of L1, unchanged from prior imaging. 2. New 5 mm anterolisthesis at L4-L5 with progressed mild disc space narrowing. 3. Mild degenerative changes have progressed. Electronically signed by: Greig Pique MD 05/05/2024 09:13 PM EST RP Workstation: [id number]   CT Cervical Spine Wo Contrast Result Date: 05/05/2024 EXAM: CT CERVICAL SPINE WITHOUT CONTRAST 05/05/2024 12:43:10 AM  IMPRESSION: 1. No acute findings. 2. Severe multilevel  left-sided facet arthrosis. Electronically signed by: Franky Stanford MD 05/05/2024 12:53 AM EST RP Workstation: [id number]      ECG & Cardiac Imaging    Atrial flutter, 138, inferolateral ST depression with approximate 1 mm of ST elevation in V1  through V3.  Anteroseptal infarct- personally reviewed.  Assessment & Plan    1.  Influenza A: Patient transferred from skilled nursing facility due to a several day history of cough, pleuritic chest pain, and headache in the setting of her son and daughter-in-law testing positive for influenza A a few days ago.  Respiratory screen positive for influenza A.  In the setting of respiratory illness, heart rates have been elevated and what appears likely to be permanent atrial flutter (see below).  Management per medicine team.  2.  Persistent versus permanent atrial flutter: Patient says that she has a history of atrial fibrillation though details are very unclear.  She says that she was once cardioverted by someone named Dr. Delores when she was living with her sister in [place], [place]  about 3 years ago however, she wavers as to whether or not this was in [place]  or [place] .  She was admitted in August with sepsis and bacteremia and was in rapid atrial flutter at that time.  Review of CV strips from that hospitalization shows persistent atrial flutter and review of notes does not indicate that there was any effort made toward rhythm management.  She was in rate controlled atrial flutter at the time of heart failure office follow-up in late September 2025.  At skilled nursing facility, she has been on metoprolol  12.5 mg twice daily as well as Eliquis  2.5 mg twice daily.  In the setting of #1, heart rates elevated up to 138 on presentation.  Further, TSH returned at less than 0.100.  She is currently on diltiazem  infusion with rates in the low 100s.  She did have some hypotension overnight which required brief discontinuation of diltiazem  and she was given 1 dose of intravenous digoxin  at that time.  Blood pressure better this morning.  Will add back beta-blocker in the setting of iatrogenic hyperthyroidism, will increase to 25 mg twice daily.  Continue IV diltiazem  for the time being.   I suspect heart rates will improve with clinical improvement and with thyroid  correction.  With advanced age, would avoid digoxin .  Continue Eliquis .  Given the likely permanent nature of atrial arrhythmias, no role for cardioversion at this time.  3.  Primary hypertension: Blood pressure elevated this morning after being soft overnight requiring fluid bolus.  Adding back home dose of beta-blocker.  Following IV diltiazem .  4.  Chronic HFpEF: Echo and TEE in August 2025 showing normal LV function with mild to moderate mitral regurgitation.  Notes indicate prior history of chronic lower extremity edema.  BNP elevated 2820 on arrival which is only mildly elevated in the setting of advanced age.  She appears euvolemic at this time and chest x-ray without edema.  Followed closely in the setting of tachycardia  5.  Hyperlipidemia: Continue statin therapy.  No recent lipids in our system.  6.  History of strokes: Chronic left arm weakness.  Stays in SNF.  Continue anticoagulation for atrial flutter.  7.  Hypothyroidism/iatrogenic hyperthyroidism: Patient on levothyroxine  in the outpatient setting.  TSH was normal in August but on check yesterday morning, TSH suppressed at less than 0.100.  Iatrogenic hyperthyroidism likely driving heart rates in the setting of flu.  Currently ordered prior home dose of levothyroxine   112 mcg daily.  Given findings of suppressed TSH, I will hold this and medicine team can reeval strategy going forward.    Risk Assessment/Risk Scores:          CHA2DS2-VASc Score = 8   This indicates a 10.8% annual risk of stroke. The patient's score is based upon: CHF History: 1 HTN History: 1 Diabetes History: 0 Stroke History: 2 Vascular Disease History: 1 Age Score: 2 Gender Score: 1     Signed, Lonni Meager, NP 05/19/2024, 8:27 AM  For questions or updates, please contact   Please consult [email or web address] for contact info under Cardiology/STEMI.       [1]   Allergies Allergen Reactions   Calcium -Containing Compounds Other (See Comments)    Restless legs   Lactose Intolerance (Gi) Diarrhea   "

## 2024-05-19 NOTE — ED Notes (Addendum)
 Per MD Agbor-Etang, this RN should administer PO Cardizem , then wait 20-30 minutes to begin titrating IV Cardizem . Then titrate in increments starting with 15mg , then decreasing to 10 mg, then 5 mg, then 0 mg.

## 2024-05-19 NOTE — Progress Notes (Signed)
" °  PROGRESS NOTE    Desare I Maser  FMW:[id number] DOB: 1933/10/29 DOA: 05/18/2024 PCP: Pcp, No  [id number]  LOS: 1 day   Brief hospital course:   Assessment & Plan:  Diane Chambers is a 89 y.o. female with medical history significant of A-fib on Eliquis , diastolic CHF, hypertension, hyperlipidemia, history of stroke with left-sided weakness, vascular dementia, hypothyroidism, carotid artery stenosis presented with flulike symptoms that has been going on since couple of days.   Patient reports that she had 2 falls in last 1 month and sustained lumbar fracture.    A-fib with RVR: - Received Cardizem  in ED.  Cardio consulted --wean off dilt gtt today --cont dilt 30 mg q6h --cont lopressor  25 mg BID --cont Eliquis   Influenza A --RVP pos for flu A --start Tamiflu    Elevated troponin: - Likely in the setting of A-fib with RVR. - Trend troponin.   Recurrent fall Vertebral fracture: - Reviewed MRI from 05/09/2024 that showed acute to subacute sacral fracture at S2 and S3, chronic L1 compression fracture.  DDD changes L3-L4.   --PT   Chronic diastolic CHF: - Last echo done 01/17/2024 that showed EF of 55 to 60%.     Hypothyroidism:  --TSH <0.1 --hold Synthroid  --obtain free T4   Hypertension: - On metoprolol  at home.    Depression/anxiety:  Continue current dose of sertraline  and trazodone    Hyperlipidemia:  Continue statin   DVT prophylaxis: On:Eliquis  Code Status: DNR  Family Communication:  Level of care: Telemetry Dispo:   The patient is from: ALF Anticipated d/c is to: ALF Anticipated d/c date is: 1-2 days   Subjective and Interval History:  Pt reported nausea despite having received zofran .   Objective: Vitals:   05/19/24 1516 05/19/24 1600 05/19/24 1700 05/19/24 1756  BP:  (!) 136/43 (!) 139/95 110/71  Pulse: 76 100  (!) 115  Resp: 20 20 20    Temp:   98.6 F (37 C) 98.6 F (37 C)  TempSrc:   Oral Oral  SpO2: 90% 95%  92%  Weight:      Height:         Intake/Output Summary (Last 24 hours) at 05/19/2024 2119 Last data filed at 05/19/2024 1144 Gross per 24 hour  Intake 500 ml  Output 800 ml  Net -300 ml   Filed Weights   05/18/24 1124 05/18/24 1132  Weight: 47.6 kg 47.6 kg    Examination:   Constitutional: NAD, AAOx3 HEENT: conjunctivae and lids normal, EOMI CV: No cyanosis.   RESP: normal respiratory effort, on RA Neuro: II - XII grossly intact.   Psych: Normal mood and affect.  Appropriate judgement and reason   Data Reviewed: I have personally reviewed labs and imaging studies  Time spent: 50 minutes  Ellouise Haber, MD [facility] If 7PM-7AM, please contact night-coverage 05/19/2024, 9:19 PM   "

## 2024-05-19 NOTE — ED Notes (Addendum)
 Diane Chambers, Cardiologist who performed last cardioversion per patient.

## 2024-05-19 NOTE — ED Notes (Signed)
 Pt given breakfast tray

## 2024-05-19 NOTE — Progress Notes (Addendum)
"  ° °      Overnight   NAME: Diane Chambers MRN: [id number] DOB : June 08, 1933    Date of Service   05/19/2024   HPI/Events of Note   HPI:     Overnight: RN notified this provider of new onset hypotension while on diltiazem  drip.  At this time attending ordered 250 cc bolus, digoxin , advised RN to pause diltiazem  drip.  Patient was febrile at this time and received a dose of Tylenol .  Patient had increase in blood pressure.     0330-  Physical Exam Vitals reviewed.  HENT:     Mouth/Throat:     Mouth: Mucous membranes are dry.  Eyes:     Pupils: Pupils are equal, round, and reactive to light.  Cardiovascular:     Rate and Rhythm: Rhythm irregular.     Pulses:          Radial pulses are 3+ on the right side and 3+ on the left side.       Posterior tibial pulses are 3+ on the right side and 3+ on the left side.  Pulmonary:     Effort: Pulmonary effort is normal.     Breath sounds: Examination of the right-upper field reveals rhonchi. Examination of the left-upper field reveals rhonchi. Examination of the right-lower field reveals decreased breath sounds. Examination of the left-lower field reveals decreased breath sounds. Decreased breath sounds and rhonchi present.  Abdominal:     General: Abdomen is flat. Bowel sounds are normal.  Musculoskeletal:     Right lower leg: No edema.     Left lower leg: No edema.  Skin:    General: Skin is warm and dry.     Capillary Refill: Capillary refill takes 2 to 3 seconds.  Neurological:     Mental Status: She is alert and oriented to person, place, and time.     GCS: GCS eye subscore is 4. GCS verbal subscore is 5. GCS motor subscore is 6.       Interventions/ Plan   Pause diltiazem  drip Digoxin  250 cc bolus UA to  rule out infectious cause for fever. Consulted cards    Updates 0330-patient has reoccurrence hypotensive episode 250 cc bolus ordered.  Afebrile.  Mentation normal.    Antwyne Pingree Donati- Garmon BSN RN CCRN AGACNP-BC Acute  Care Nurse Practitioner [facility]  "

## 2024-05-19 NOTE — ED Notes (Signed)
"  Hospitalist NP at bedside.   "

## 2024-05-19 NOTE — Evaluation (Signed)
 Physical Therapy Evaluation Patient Details Name: Diane Chambers MRN: [id number] DOB: 30-Sep-1933 Today's Date: 05/19/2024  History of Present Illness  Diane Chambers is a 89 y.o. female with medical history significant of A-fib on Eliquis , diastolic CHF, hypertension, hyperlipidemia, history of stroke with left-sided weakness, vascular dementia, hypothyroidism, carotid artery stenosis presented with flulike symptoms that has been going on since couple of days.  Clinical Impression  Patient noted to be in supine position at PT arrival in room, for an initial PT evaluation due to a decline in functional status, with baseline mobility reported as needing assistance for ADLs and mobility, and currently requiring modA to stand at beside. The patient has cognitive deficits at baseline, presenting with good willingness to work with PT and eager to show what she can do. The patient lives as a resident at [facility]. Gait was assessed with RW, limited by weakness and fear of falling. Pt ultimately unable to walk but was willing to stand and march at bedside. The overall clinical impression is that the patient presents with moderate mobility limitations. Recommended skilled PT will address safety, mobility, and discharge planning.       If plan is discharge home, recommend the following: A little help with walking and/or transfers;A little help with bathing/dressing/bathroom;Help with stairs or ramp for entrance;Assist for transportation;Assistance with cooking/housework;Direct supervision/assist for financial management;Supervision due to cognitive status;Direct supervision/assist for medications management   Can travel by private vehicle   No    Equipment Recommendations None recommended by PT  Recommendations for Other Services       Functional Status Assessment Patient has had a recent decline in their functional status and demonstrates the ability to make significant improvements in function in a  reasonable and predictable amount of time.     Precautions / Restrictions Precautions Precautions: Fall      Mobility  Bed Mobility Overal bed mobility: Needs Assistance Bed Mobility: Supine to Sit, Sit to Supine     Supine to sit: Min assist, Mod assist Sit to supine: Mod assist   General bed mobility comments: failed transition to stand with first attempt with modA, pt eager to attempt again and was successful with less assistance; required x2 modA for positioning in bed    Transfers Overall transfer level: Needs assistance Equipment used: Rolling walker (2 wheels) Transfers: Sit to/from Stand Sit to Stand: Min assist, Mod assist           General transfer comment: required constant modA to prevent posterior weight shift    Ambulation/Gait               General Gait Details: deferred ambulation at this time due to weakness, posterior weight shift and unsteadiness with standing; Pt was able to stand and march x10 each side  Stairs            Wheelchair Mobility     Tilt Bed    Modified Rankin (Stroke Patients Only)       Balance Overall balance assessment: Needs assistance Sitting-balance support: Feet supported Sitting balance-Leahy Scale: Fair   Postural control: Posterior lean Standing balance support: During functional activity, Reliant on assistive device for balance Standing balance-Leahy Scale: Poor Standing balance comment: vc for RW use and anterior weight shift                             Pertinent Vitals/Pain Pain Assessment Breathing: normal Negative Vocalization: none Facial Expression: smiling or  inexpressive Body Language: relaxed Consolability: no need to console PAINAD Score: 0    Home Living Family/patient expects to be discharged to:: Skilled nursing facility                   Additional Comments: pt from [facility], eventual dc to [facility]; information from chart    Prior Function Prior  Level of Function : Needs assist             Mobility Comments: amb with RW at SNF ADLs Comments: assist for ADL/IADL from SNF staff     Extremity/Trunk Assessment   Upper Extremity Assessment Upper Extremity Assessment: Generalized weakness    Lower Extremity Assessment Lower Extremity Assessment: Generalized weakness    Cervical / Trunk Assessment Cervical / Trunk Assessment: Kyphotic  Communication   Communication Communication: Impaired Factors Affecting Communication: Hearing impaired    Cognition Arousal: Alert Behavior During Therapy: WFL for tasks assessed/performed   PT - Cognitive impairments: History of cognitive impairments                         Following commands: Impaired Following commands impaired: Follows multi-step commands inconsistently     Cueing Cueing Techniques: Verbal cues, Tactile cues, Gestural cues     General Comments      Exercises     Assessment/Plan    PT Assessment Patient needs continued PT services  PT Problem List Decreased strength;Decreased activity tolerance;Decreased balance;Decreased mobility;Decreased cognition       PT Treatment Interventions Gait training;Stair training;Functional mobility training;Therapeutic activities;Therapeutic exercise;Balance training;Neuromuscular re-education;Patient/family education    PT Goals (Current goals can be found in the Care Plan section)  Acute Rehab PT Goals Patient Stated Goal: Pt wants to return to nursing facility PT Goal Formulation: With patient Time For Goal Achievement: 06/02/24 Potential to Achieve Goals: Good    Frequency Min 2X/week     Co-evaluation               AM-PAC PT 6 Clicks Mobility  Outcome Measure Help needed turning from your back to your side while in a flat bed without using bedrails?: A Little Help needed moving from lying on your back to sitting on the side of a flat bed without using bedrails?: A Little Help needed  moving to and from a bed to a chair (including a wheelchair)?: A Little Help needed standing up from a chair using your arms (e.g., wheelchair or bedside chair)?: A Little Help needed to walk in hospital room?: A Little Help needed climbing 3-5 steps with a railing? : A Lot 6 Click Score: 17    End of Session Equipment Utilized During Treatment: Gait belt Activity Tolerance: Patient tolerated treatment well Patient left: in bed;with bed alarm set;with call bell/phone within reach;with nursing/sitter in room Nurse Communication: Mobility status PT Visit Diagnosis: Other abnormalities of gait and mobility (R26.89);Difficulty in walking, not elsewhere classified (R26.2);History of falling (Z91.81);Unsteadiness on feet (R26.81)    Time: 1557-1610 PT Time Calculation (min) (ACUTE ONLY): 13 min   Charges:   PT Evaluation $PT Eval Low Complexity: 1 Low   PT General Charges $$ ACUTE PT VISIT: 1 Visit         Sherlean Lesches DPT, PT    Ivey Cina A Jayceion Lisenby 05/19/2024, 4:18 PM

## 2024-05-20 DIAGNOSIS — I4892 Unspecified atrial flutter: Secondary | ICD-10-CM | POA: Diagnosis not present

## 2024-05-20 DIAGNOSIS — I4891 Unspecified atrial fibrillation: Secondary | ICD-10-CM | POA: Diagnosis not present

## 2024-05-20 LAB — COMPREHENSIVE METABOLIC PANEL WITH GFR
ALT: 42 U/L (ref 0–44)
AST: 41 U/L (ref 15–41)
Albumin: 4 g/dL (ref 3.5–5.0)
Alkaline Phosphatase: 376 U/L — ABNORMAL HIGH (ref 38–126)
Anion gap: 10 (ref 5–15)
BUN: 12 mg/dL (ref 8–23)
CO2: 28 mmol/L (ref 22–32)
Calcium: 9.4 mg/dL (ref 8.9–10.3)
Chloride: 98 mmol/L (ref 98–111)
Creatinine, Ser: 0.63 mg/dL (ref 0.44–1.00)
GFR, Estimated: >60 mL/min
Glucose, Bld: 128 mg/dL — ABNORMAL HIGH (ref 70–99)
Potassium: 4.3 mmol/L (ref 3.5–5.1)
Sodium: 136 mmol/L (ref 135–145)
Total Bilirubin: 0.6 mg/dL (ref 0.0–1.2)
Total Protein: 7.3 g/dL (ref 6.5–8.1)

## 2024-05-20 LAB — T4, FREE: Free T4: 1.39 ng/dL (ref 0.80–2.00)

## 2024-05-20 NOTE — Plan of Care (Signed)

## 2024-05-20 NOTE — Progress Notes (Signed)
 "  Rounding Note   Patient Name: Diane Chambers Date of Encounter: 05/20/2024  [facility] Cardiologist: Timothy Gollan, MD   Subjective Patient reports productive cough which is uncomfortable. She has intermittent palpitations. Denies chest pain and shortness of breath. Remains in atrial flutter with rate reasonably well controlled at 70s-90s bpm, up to 140 at times. BP is labile but overall stable.   Scheduled Meds:  apixaban   2.5 mg Oral BID   atorvastatin   20 mg Oral Daily   diltiazem   30 mg Oral Q6H   gabapentin   100 mg Oral TID   metoprolol  tartrate  25 mg Oral BID   oseltamivir   30 mg Oral BID   sertraline   25 mg Oral Daily   traZODone   100 mg Oral QHS   Continuous Infusions:  PRN Meds: acetaminophen  **OR** acetaminophen , hydrALAZINE , ondansetron  **OR** ondansetron  (ZOFRAN ) IV, prochlorperazine    Vital Signs  Vitals:   05/19/24 1756 05/20/24 0429 05/20/24 0633 05/20/24 0837  BP: 110/71 (!) 177/105 133/82 104/60  Pulse: (!) 115 (!) 138 (!) 106 76  Resp:  (!) 22    Temp: 98.6 F (37 C) 99.6 F (37.6 C)  98 F (36.7 C)  TempSrc: Oral Oral  Oral  SpO2: 92% 91% 90% 90%  Weight:      Height:        Intake/Output Summary (Last 24 hours) at 05/20/2024 0940 Last data filed at 05/20/2024 0451 Gross per 24 hour  Intake 956 ml  Output 400 ml  Net 556 ml      05/18/2024   11:32 AM 05/18/2024   11:24 AM 05/05/2024    1:32 AM  Last 3 Weights  Weight (lbs) 105 lb 105 lb 105 lb  Weight (kg) 47.628 kg 47.628 kg 47.628 kg      Telemetry Atrial flutter, rate 70-140 bpm - Personally Reviewed  Physical Exam  GEN: No acute distress.   Neck: No JVD Cardiac: IRIR, no murmurs, rubs, or gallops.  Respiratory: Coarse breath sounds throughout GI: Soft, nontender, non-distended  MS: No edema; No deformity. Neuro:  Nonfocal  Psych: Normal affect   Labs High Sensitivity Troponin:  No results for input(s): TROPONINIHS in the last 720 hours.  Recent Labs   Lab 05/18/24 1139 05/18/24 1635  TRNPT 20* 17       Chemistry Recent Labs  Lab 05/18/24 1139 05/19/24 0614 05/20/24 0501  NA 135 137 136  K 3.9 4.0 4.3  CL 98 103 98  CO2 26 24 28   GLUCOSE 127* 103* 128*  BUN 10 10 12   CREATININE 0.59 0.65 0.63  CALCIUM  9.3 9.0 9.4  MG 2.1  --   --   PROT  --  7.0 7.3  ALBUMIN  --  3.9 4.0  AST  --  38 41  ALT  --  34 42  ALKPHOS  --  373* 376*  BILITOT  --  0.6 0.6  GFRNONAA >60 >60 >60  ANIONGAP 11 10 10     Lipids No results for input(s): CHOL, TRIG, HDL, LABVLDL, LDLCALC, CHOLHDL in the last 168 hours.  Hematology Recent Labs  Lab 05/18/24 1139  WBC 8.5  RBC 4.36  HGB 13.2  HCT 41.0  MCV 94.0  MCH 30.3  MCHC 32.2  RDW 14.1  PLT 344   Thyroid   Recent Labs  Lab 05/18/24 1139 05/19/24 2324  TSH <0.100*  --   FREET4  --  1.39    BNP Recent Labs  Lab 05/18/24 1139  PROBNP 2,820.0*    DDimer No results for input(s): DDIMER in the last 168 hours.   Radiology  DG Chest 2 View Result Date: 05/18/2024 IMPRESSION: Chronic appearing increased lung markings without evidence of acute or active cardiopulmonary disease. Electronically Signed   By: Suzen Dials M.D.   On: 05/18/2024 13:35   Cardiac Studies  05/19/2024 Echo complete 1. Left ventricular ejection fraction, by estimation, is 60 to 65%. The  left ventricle has normal function. The left ventricle has no regional  wall motion abnormalities. There is mild left ventricular hypertrophy.  Left ventricular diastolic parameters  are indeterminate.   2. Right ventricular systolic function is normal. The right ventricular  size is normal. There is mildly elevated pulmonary artery systolic  pressure.   3. Left atrial size was moderately dilated.   4. Right atrial size was moderately dilated.   5. The mitral valve is normal in structure. Mild to moderate mitral valve  regurgitation.   6. The aortic valve is tricuspid. Aortic valve regurgitation is  mild.   7. The inferior vena cava is normal in size with <50% respiratory  variability, suggesting right atrial pressure of 8 mmHg.   Patient Profile   89 y.o. female  with a history of persistent (?  Permanent) atrial fibrillation/flutter, chronic HFpEF, hypertension, hyperlipidemia, multiple strokes with left arm weakness, TIAs, vascular dementia, hypothyroidism, Staph aureus bacteremia (August 2025), colitis, carotid arterial disease, cervical and lumbar disc disease, degenerative joint disease, restless leg syndrome, mitral regurgitation, and GERD who is being seen for the ongoing management of atrial flutter with RVR in the setting of influenza A and iatrogenic hyperthyroidism.   Assessment & Plan   Persistent vs permanent atrial flutter - Unclear chronicity/details, although reports prior DCCV about 3 years prior. Prior admission for sepsis 12/2023 during which she was in rapid atrial flutter. Follow up 01/2024 with AHF team was again in atrial flutter.  - Presenting in rapid atrial flutter likely secondary to influenza infection - TSH notably 0.100 - Echo this admission with EF 60-65% and moderately dilated LA - Initially started on diltiazem  infusion with improvement in rates, transited to oral diltiazem  this morning - Telemetry reveals atrial flutter with rate 70s-130s bpm - Continue Eliquis  2.5 mg twice daily (age, weight), diltiazem  30 mg every 6 hours, and metoprolol  tartrate 25 mg twice daily - Consider consolidating diltiazem  tomorrow  - Suspect that rates will continue to improve as acute illness resolves and thyroid  correction  Influenza A - Presenting with cough, pleuritic chest pain, and headache with positive influenza A testing - Likely contributing to elevated HR - On Tamiflu  - Ongoing management per IM  Primary hypertension - BP labile but overall stable - Continue metoprolol  and diltiazem  as above  Chronic HFpEF - Echo this admission with preserved LV systolic  function  - Pro BNP 7179 on admission - Appears euvolemic on exam - Continue to monitor volume status. Recommend net even fluid balance.   Hyperlipidemia - Continue statin therapy  Hx stroke - Chronic left arm weakness and resides in SNF - Continue anticoagulation and statin therapy as above  Hypothyroidism/iatrogenic hyperthyroidism - Takes levothyroxine  as outpatient with normal TSH in 12/2023  - TSH suppressed at < 0.100 on admission - Likely contributing to increased HR in addition to flue - Ongoing management per IM    For questions or updates, please contact [facility] Please consult [email or web address] for contact info under       Signed, Lesley LITTIE Maffucci,  PA-C  05/20/2024, 9:40 AM    "

## 2024-05-20 NOTE — TOC Initial Note (Addendum)
 Transition of Care [facility]) - Initial/Assessment Note    Patient Details  Name: Diane Chambers MRN: [id number] Date of Birth: 04/30/1934  Transition [facility]) CM/SW Contact:    Shasta DELENA Daring, RN Phone Number: 05/20/2024, 11:30 AM  Clinical Narrative:                   Expected Discharge Plan: Skilled Nursing Facility Barriers to Discharge: Continued Medical Work up   Patient Goals and CMS Choice    Risk assessment complete. Patient lives is assisted living center at [facility]. Facility provides transportation. Has an MD that handles primary care responsibilities for residents. Patient says she has a walker and a wheelchair. A facility aide helps her with bathing.   Patient is agreeable to SNF for rehab. Said we could speak to her son regarding placement.   Spoke to Patient's son, Diane Chambers. He is in agreement that SNF placement for rehab is the right option.  RNCM will initiate bed search focusing on [place], [place], [place], [place].  2:18 PM. FL2 complete. Search initiated.          Expected Discharge Plan and Services   Discharge Planning Services: CM Consult   Living arrangements for the past 2 months: Assisted Living Facility                                      Prior Living Arrangements/Services Living arrangements for the past 2 months: Assisted Living Facility Lives with:: Facility Resident Patient language and need for interpreter reviewed:: Yes        Need for Family Participation in Patient Care: Yes (Comment) Care giver support system in place?: Yes (comment) Current home services: DME Criminal Activity/Legal Involvement Pertinent to Current Situation/Hospitalization: No - Comment as needed  Activities of Daily Living      Permission Sought/Granted Permission sought to share information with : Case Manager, Family Supports Permission granted to share information with : Yes, Verbal Permission Granted  Share Information with NAME: Diane Chambers [place] (son/Emergency contact)  Permission granted to share info w AGENCY: [employer]        Emotional Assessment Appearance:: Appears stated age Attitude/Demeanor/Rapport: Gracious, Engaged Affect (typically observed): Appropriate Orientation: : Oriented to Self, Oriented to Place, Oriented to Situation Alcohol / Substance Use: Not Applicable Psych Involvement: No (comment)  Admission diagnosis:  Atrial fibrillation with rapid ventricular response (HCC) [I48.91] Atrial fibrillation with RVR (HCC) [I48.91] Patient Active Problem List   Diagnosis Date Noted   Heart failure with preserved ejection fraction (HFpEF) (HCC) 05/19/2024   Endocarditis 01/17/2024   Atrial fibrillation with RVR (HCC) 01/16/2024   Pneumonia due to human metapneumovirus 01/16/2024   MRSA bacteremia 01/16/2024   Acute bronchitis 01/14/2024   Sepsis (HCC) 01/14/2024   HLD (hyperlipidemia) 01/14/2024   Chronic diastolic CHF (congestive heart failure) (HCC) 01/14/2024   Hypothyroidism 01/14/2024   Myocardial injury 01/14/2024   Chronic atrial fibrillation with RVR (HCC) 01/14/2024   Bacteremia 01/14/2024   OA (osteoarthritis) of knee 01/29/2018   Colitis 12/16/2016   Confusion 12/29/2014   TIA (transient ischemic attack) 12/29/2014   Essential hypertension 12/29/2014   Hyponatremia 12/29/2014   PCP:  Pcp, No Pharmacy:   [id number] - [place] - [address] [place]) [place] [address] Phone: [phone] Fax: [phone]  [facility] - [place], [place] - [address]  [address] [place] [address] Phone: [phone] Fax: [phone]     Social Drivers of Health (SDOH) Social History: SDOH Screenings   Food Insecurity: No Food Insecurity (05/19/2024)  Housing: Unknown (05/19/2024)  Transportation Needs: No Transportation Needs (05/19/2024)  Utilities: Not At Risk (05/19/2024)  Social  Connections: Moderately Integrated (05/19/2024)  Tobacco Use: Medium Risk (05/19/2024)   SDOH Interventions:     Readmission Risk Interventions    05/20/2024   11:25 AM  Readmission Risk Prevention Plan  Transportation Screening Complete  PCP or Specialist Appt within 3-5 Days --  HRI or Home Care Consult Complete  Social Work Consult for Recovery Care Planning/Counseling Complete  Palliative Care Screening Not Applicable  Medication Review Oceanographer) Complete

## 2024-05-20 NOTE — [place] FL2 (Signed)
 " [place]  MEDICAID FL2 LEVEL OF CARE FORM     IDENTIFICATION  Patient Name: RHIAN ASEBEDO Birthdate: Jul 31, 1933 Sex: female Admission Date (Current Location): 05/18/2024  [facility] and [place] Number:  Chiropodist and Address:  [facility], [address], [place], [place] [address]      Provider Number: [id number]  Attending Physician Name and Address:  Awanda City, MD  Relative Name and Phone Number:       Current Level of Care: Hospital Recommended Level of Care: Skilled Nursing Facility Prior Approval Number:    Date Approved/Denied:   PASRR Number: [id number]  Discharge Plan: SNF    Current Diagnoses: Patient Active Problem List   Diagnosis Date Noted   Heart failure with preserved ejection fraction (HFpEF) (HCC) 05/19/2024   Endocarditis 01/17/2024   Atrial fibrillation with RVR (HCC) 01/16/2024   Pneumonia due to human metapneumovirus 01/16/2024   MRSA bacteremia 01/16/2024   Acute bronchitis 01/14/2024   Sepsis (HCC) 01/14/2024   HLD (hyperlipidemia) 01/14/2024   Chronic diastolic CHF (congestive heart failure) (HCC) 01/14/2024   Hypothyroidism 01/14/2024   Myocardial injury 01/14/2024   Chronic atrial fibrillation with RVR (HCC) 01/14/2024   Bacteremia 01/14/2024   OA (osteoarthritis) of knee 01/29/2018   Colitis 12/16/2016   Confusion 12/29/2014   TIA (transient ischemic attack) 12/29/2014   Essential hypertension 12/29/2014   Hyponatremia 12/29/2014    Orientation RESPIRATION BLADDER Height & Weight     Self, Time, Situation  Normal Continent Weight: 47.6 kg Height:  5' 6 (167.6 cm)  BEHAVIORAL SYMPTOMS/MOOD NEUROLOGICAL BOWEL NUTRITION STATUS   (None)  (None) Continent Diet (heart heathy)  AMBULATORY STATUS COMMUNICATION OF NEEDS Skin   Extensive Assist Verbally Normal                       Personal Care Assistance Level of Assistance  Bathing, Feeding, Dressing Bathing Assistance: Maximum  assistance Feeding assistance: Limited assistance Dressing Assistance: Maximum assistance     Functional Limitations Info  Sight, Hearing, Speech Sight Info: Impaired Hearing Info: Impaired Speech Info: Adequate    SPECIAL CARE FACTORS FREQUENCY  PT (By licensed PT), OT (By licensed OT)     PT Frequency: 5x week OT Frequency: 5x week            Contractures Contractures Info: Not present    Additional Factors Info  Code Status, Allergies Code Status Info: DNR Allergies Info: calcium  containing compounds, lactose intolerant           Current Medications (05/20/2024):  This is the current hospital active medication list Current Facility-Administered Medications  Medication Dose Route Frequency Provider Last Rate Last Admin   acetaminophen  (TYLENOL ) tablet 650 mg  650 mg Oral Q6H PRN Pahwani, Rinka R, MD   650 mg at 05/20/24 9061   Or   acetaminophen  (TYLENOL ) suppository 650 mg  650 mg Rectal Q6H PRN Pahwani, Rinka R, MD       apixaban  (ELIQUIS ) tablet 2.5 mg  2.5 mg Oral BID Pahwani, Rinka R, MD   2.5 mg at 05/20/24 9060   atorvastatin  (LIPITOR) tablet 20 mg  20 mg Oral Daily Pahwani, Rinka R, MD   20 mg at 05/20/24 9060   diltiazem  (CARDIZEM ) tablet 30 mg  30 mg Oral Q6H Agbor-Etang, Redell, MD   30 mg at 05/20/24 1228   gabapentin  (NEURONTIN ) capsule 100 mg  100 mg Oral TID Pahwani, Rinka R, MD   100 mg at 05/20/24  9060   hydrALAZINE  (APRESOLINE ) injection 10 mg  10 mg Intravenous Q6H PRN Pahwani, Rinka R, MD       metoprolol  tartrate (LOPRESSOR ) tablet 25 mg  25 mg Oral BID Vivienne Lonni Ingle, NP   25 mg at 05/20/24 9060   ondansetron  (ZOFRAN ) tablet 4 mg  4 mg Oral Q6H PRN Pahwani, Rinka R, MD       Or   ondansetron  (ZOFRAN ) injection 4 mg  4 mg Intravenous Q6H PRN Pahwani, Rinka R, MD   4 mg at 05/19/24 1240   oseltamivir  (TAMIFLU ) capsule 30 mg  30 mg Oral BID Awanda City, MD   30 mg at 05/20/24 9060   prochlorperazine  (COMPAZINE ) injection 5 mg  5 mg  Intravenous Q6H PRN Awanda City, MD   5 mg at 05/19/24 1401   sertraline  (ZOLOFT ) tablet 25 mg  25 mg Oral Daily Pahwani, Rinka R, MD   25 mg at 05/20/24 9060   traZODone  (DESYREL ) tablet 100 mg  100 mg Oral QHS Pahwani, Rinka R, MD   100 mg at 05/19/24 2139     Discharge Medications: Please see discharge summary for a list of discharge medications.  Relevant Imaging Results:  Relevant Lab Results:   Additional Information SS#: [id number]  Shasta DELENA Daring, RN     "

## 2024-05-20 NOTE — Care Management Important Message (Signed)
 Important Message  Patient Details  Name: Diane Chambers MRN: [id number] Date of Birth: 01-May-1934   Important Message Given:  Yes - Medicare IM     Rojelio SHAUNNA Rattler 05/20/2024, 1:54 PM

## 2024-05-20 NOTE — Progress Notes (Signed)
" °  PROGRESS NOTE    Diane Chambers  FMW:[id number] DOB: 1933/08/12 DOA: 05/18/2024 PCP: Pcp, No  [id number]  LOS: 2 days   Brief hospital course:   Assessment & Plan:  Diane Chambers is a 89 y.o. female with medical history significant of A-fib on Eliquis , diastolic CHF, hypertension, hyperlipidemia, history of stroke with left-sided weakness, vascular dementia, hypothyroidism, carotid artery stenosis presented with flulike symptoms that has been going on since couple of days.   Patient reports that she had 2 falls in last 1 month and sustained lumbar fracture.    A-fib with RVR: - Received Cardizem  in ED.  Cardio consulted --wean off dilt gtt on 12/28 --cont dilt 30 mg q6h --cont lopressor  25 mg BID --cont Eliquis   Influenza A --RVP pos for flu A --cont Tamiflu    Elevated troponin, not clinically significant   Recurrent fall Vertebral fracture: - Reviewed MRI from 05/09/2024 that showed acute to subacute sacral fracture at S2 and S3, chronic L1 compression fracture.  DDD changes L3-L4.   --PT   Chronic diastolic CHF: - Last echo done 01/17/2024 that showed EF of 55 to 60%.     Hypothyroidism:  --TSH <0.1, but Free T4 wnl --hold Synthroid  for now, pending endo input   Hypertension: - On metoprolol  at home.    Depression/anxiety:  Continue current dose of sertraline  and trazodone    Hyperlipidemia:  Continue statin  Elevated alk phos --stable around 370's.  Had had elevations in the past. --monitor   DVT prophylaxis: On:Eliquis  Code Status: DNR  Family Communication:  Level of care: Telemetry Dispo:   The patient is from: ALF Anticipated d/c is to: SNF rehab Anticipated d/c date is: 1-2 days   Subjective and Interval History:  Pt reported feeling a little better.  Able to eat.   Objective: Vitals:   05/20/24 0633 05/20/24 0837 05/20/24 1249 05/20/24 1745  BP: 133/82 104/60 (!) 85/43 116/64  Pulse: (!) 106 76 66 73  Resp:   18 16  Temp:  98 F (36.7  C) (!) 97.3 F (36.3 C) (!) 97.4 F (36.3 C)  TempSrc:  Oral    SpO2: 90% 90% 96% 94%  Weight:      Height:        Intake/Output Summary (Last 24 hours) at 05/20/2024 1931 Last data filed at 05/20/2024 0900 Gross per 24 hour  Intake 1076 ml  Output --  Net 1076 ml   Filed Weights   05/18/24 1124 05/18/24 1132  Weight: 47.6 kg 47.6 kg    Examination:   Constitutional: NAD, AAOx3 HEENT: conjunctivae and lids normal, EOMI CV: No cyanosis.   RESP: normal respiratory effort, on RA Neuro: II - XII grossly intact.   Psych: Normal mood and affect.  Appropriate judgement and reason   Data Reviewed: I have personally reviewed labs and imaging studies  Time spent: 50 minutes  Ellouise Haber, MD [facility] If 7PM-7AM, please contact night-coverage 05/20/2024, 7:31 PM   "

## 2024-05-21 DIAGNOSIS — I4891 Unspecified atrial fibrillation: Secondary | ICD-10-CM | POA: Diagnosis not present

## 2024-05-21 LAB — MRSA NEXT GEN BY PCR, NASAL: MRSA by PCR Next Gen: DETECTED — AB

## 2024-05-21 MED ORDER — CHLORHEXIDINE GLUCONATE CLOTH 2 % EX PADS
6.0000 | MEDICATED_PAD | Freq: Every day | CUTANEOUS | Status: DC
  Administered 2024-05-21: 6 via TOPICAL

## 2024-05-21 MED ORDER — MUPIROCIN 2 % EX OINT: 1.0000 | TOPICAL_OINTMENT | Freq: Two times a day (BID) | CUTANEOUS | Status: AC

## 2024-05-21 MED ORDER — OSELTAMIVIR PHOSPHATE 30 MG PO CAPS: 30.0000 mg | ORAL_CAPSULE | Freq: Two times a day (BID) | ORAL | Status: AC

## 2024-05-21 MED ORDER — MUPIROCIN 2 % EX OINT
1.0000 | TOPICAL_OINTMENT | Freq: Two times a day (BID) | CUTANEOUS | Status: DC
  Filled 2024-05-21: qty 22

## 2024-05-21 MED ORDER — HYDROXYZINE HCL 10 MG PO TABS: 10.0000 mg | ORAL_TABLET | Freq: Three times a day (TID) | ORAL | Status: AC | PRN

## 2024-05-21 MED ORDER — DILTIAZEM HCL ER COATED BEADS 120 MG PO CP24: 120.0000 mg | ORAL_CAPSULE | Freq: Every day | ORAL | Status: AC

## 2024-05-21 MED ORDER — DILTIAZEM HCL ER COATED BEADS 120 MG PO CP24
120.0000 mg | ORAL_CAPSULE | Freq: Every day | ORAL | Status: DC
  Administered 2024-05-21: 120 mg via ORAL
  Filled 2024-05-21: qty 1

## 2024-05-21 MED ORDER — CHLORHEXIDINE GLUCONATE CLOTH 2 % EX PADS: 6.0000 | MEDICATED_PAD | Freq: Every day | CUTANEOUS | Status: AC

## 2024-05-21 NOTE — Discharge Summary (Addendum)
 "  Physician Discharge Summary   Diane Chambers  female DOB: Dec 01, 1933  FMW:[id number]  PCP: Pcp, No  Admit date: 05/18/2024 Discharge date: 05/21/2024  Admitted From: ALF Disposition:  SNF rehab CODE STATUS: DNR   Hospital Course:  For full details, please see H&P, progress notes, consult notes and ancillary notes.  Briefly,  Diane Chambers is a 89 y.o. female with medical history significant of A-fib on Eliquis , diastolic CHF, hypertension, history of stroke with left-sided weakness, vascular dementia, hypothyroidism, carotid artery stenosis presented with flulike symptoms that has been going on since couple of days.    Patient reports that she had 2 falls in last 1 month and sustained lumbar fracture.    A-fib with RVR: - Received Cardizem  in ED.  Cardio consulted --wean off dilt gtt on 12/28 --consolidated dilt 30 mg q6h to Cardizem  120 mg daily prior to discharge. --cont home Lopressor  --cont Eliquis    Influenza A --RVP pos for flu A --cont Tamiflu  for 2 more days after discharge.   Elevated troponin, not clinically significant   Recurrent fall Vertebral fracture: - Reviewed MRI from 05/09/2024 that showed acute to subacute sacral fracture at S2 and S3, chronic L1 compression fracture.  DDD changes L3-L4.     Chronic diastolic CHF: - Last echo done 01/17/2024 that showed EF of 55 to 60%.     Hx of Hypothyroidism, not currently active Subclinical hyperthyroidism  --TSH <0.1, but Free T4 wnl --home Synthroid  d/c'ed. --repeat TSH and free T4 in 2-3 months   Hypertension: - On metoprolol  at home.     Depression/anxiety:  Continue current dose of sertraline  and trazodone    Hyperlipidemia:  Continue statin   Elevated alk phos --stable around 370's.  Had had elevations in the past. --repeat alk phos in 1-2 weeks.   Unless noted above, medications under STOP list are ones pt was not taking PTA.  Discharge Diagnoses:  Principal Problem:   Atrial fibrillation  with RVR (HCC) Active Problems:   Heart failure with preserved ejection fraction (HFpEF) (HCC)   Atrial flutter with rapid ventricular response (HCC)   30 Day Unplanned Readmission Risk Score    Flowsheet Row ED to Hosp-Admission (Current) from 05/18/2024 in [facility]  30 Day Unplanned Readmission Risk Score (%) 20.81 Filed at 05/21/2024 1200    This score is the patient's risk of an unplanned readmission within 30 days of being discharged (0 -100%). The score is based on dignosis, age, lab data, medications, orders, and past utilization.   Low:  0-14.9   Medium: 15-21.9   High: 22-29.9   Extreme: 30 and above         Discharge Instructions:  Allergies as of 05/21/2024       Reactions   Calcium -containing Compounds Other (See Comments)   Restless legs   Lactose Intolerance (gi) Diarrhea        Medication List     STOP taking these medications    diazepam  2 MG tablet Commonly known as: Valium    furosemide  40 MG tablet Commonly known as: LASIX    halobetasol 0.05 % cream Commonly known as: ULTRAVATE   hydrocortisone 2.5 % ointment   levothyroxine  112 MCG tablet Commonly known as: SYNTHROID    multivitamin with minerals Tabs tablet   permethrin 5 % cream Commonly known as: ELIMITE   triamcinolone  ointment 0.1 % Commonly known as: KENALOG        TAKE these medications    acetaminophen  500 MG tablet Commonly  known as: TYLENOL  Take 2 tablets (1,000 mg total) by mouth every 6 (six) hours as needed.   ascorbic acid  500 MG tablet Commonly known as: VITAMIN C  Take 500 mg by mouth daily.   atorvastatin  20 MG tablet Commonly known as: LIPITOR Take 20 mg by mouth daily.   b complex vitamins capsule Take 1 capsule by mouth daily.   cetirizine 10 MG tablet Commonly known as: ZYRTEC Take 10 mg by mouth 2 (two) times daily.   Chlorhexidine  Gluconate Cloth 2 % Pads Apply 6 each topically daily for 5 days.   dextromethorphan 30  MG/5ML liquid Commonly known as: DELSYM Take 10 mg by mouth as needed for cough.   diltiazem  120 MG 24 hr capsule Commonly known as: CARDIZEM  CD Take 1 capsule (120 mg total) by mouth daily. Start taking on: May 22, 2024   Eliquis  2.5 MG Tabs tablet Generic drug: apixaban  Take 2.5 mg by mouth 2 (two) times daily.   empagliflozin  10 MG Tabs tablet Commonly known as: JARDIANCE  Take 10 mg by mouth daily.   gabapentin  100 MG capsule Commonly known as: NEURONTIN  Take 100 mg by mouth 3 (three) times daily.   hydrOXYzine  10 MG tablet Commonly known as: ATARAX  Take 1 tablet (10 mg total) by mouth every 8 (eight) hours as needed. Home med. What changed:  when to take this reasons to take this additional instructions   lidocaine  5 % Commonly known as: Lidoderm  Place 1 patch onto the skin every 12 (twelve) hours. Remove & Discard patch within 12 hours or as directed by MD   loperamide 2 MG capsule Commonly known as: IMODIUM Take 2 mg by mouth every 6 (six) hours as needed for diarrhea or loose stools.   methocarbamol  500 MG tablet Commonly known as: ROBAXIN  Take 1 tablet (500 mg total) by mouth every 8 (eight) hours as needed.   metoprolol  tartrate 25 MG tablet Commonly known as: LOPRESSOR  Take 12.5 mg by mouth 2 (two) times daily. Hold for SBP <110   mupirocin  ointment 2 % Commonly known as: BACTROBAN  Place 1 Application into the nose 2 (two) times daily.   oseltamivir  30 MG capsule Commonly known as: TAMIFLU  Take 1 capsule (30 mg total) by mouth 2 (two) times daily for 2 days.   potassium chloride  10 MEQ tablet Commonly known as: KLOR-CON  Take 10 mEq by mouth daily.   sertraline  25 MG tablet Commonly known as: ZOLOFT  Take 25 mg by mouth daily.   traZODone  100 MG tablet Commonly known as: DESYREL  Take 100 mg by mouth at bedtime.   Vitamin D  50 MCG (2000 UT) Caps Take 2,000 Units by mouth daily.   ZINC OXIDE (TOPICAL) 10 % Crea Apply 1 Application  topically in the morning and at bedtime. Apply to buttocks topically for redness         Contact information for follow-up providers     Lorene Sinclair L, PA-C Follow up in 1 week(s).   Specialties: Physician Assistant, Cardiology Contact information: [address] [place] [address] [phone]              Contact information for after-discharge care     Destination     [facility] .   Service: Skilled Nursing Contact information: [address] [place]  [address] [phone]                     Allergies[1]   The results of significant diagnostics from  this hospitalization (including imaging, microbiology, ancillary and laboratory) are listed below for reference.   Consultations:   Procedures/Studies: ECHOCARDIOGRAM COMPLETE Result Date: 05/19/2024    ECHOCARDIOGRAM REPORT   Patient Name:   Diane Chambers Date of Exam: 05/19/2024 Medical Rec #:  [id number]    Height:       66.0 in Accession #:    [id number]   Weight:       105.0 lb Date of Birth:  28-Apr-1934     BSA:          1.521 m Patient Age:    89 years     BP:           168/96 mmHg Patient Gender: F            HR:           136 bpm. Exam Location:  [facility] Procedure: 2D Echo, 3D Echo, Cardiac Doppler and Color Doppler (Both Spectral            and Color Flow Doppler were utilized during procedure). Indications:     Abnormal ECG  History:         Patient has prior history of Echocardiogram examinations, most                  recent 01/17/2024. CAD; Risk Factors:Hypertension,                  Hypothyroidism and Former Smoker.  Sonographer:     Logan Shove RDCS Referring Phys:  [id number] LANETA HERO [facility] Diagnosing Phys: Redell Cave MD IMPRESSIONS  1. Left ventricular ejection fraction, by estimation, is 60 to 65%. The left ventricle has normal function. The left ventricle has no regional wall motion abnormalities. There is mild left ventricular  hypertrophy. Left ventricular diastolic parameters are indeterminate.  2. Right ventricular systolic function is normal. The right ventricular size is normal. There is mildly elevated pulmonary artery systolic pressure.  3. Left atrial size was moderately dilated.  4. Right atrial size was moderately dilated.  5. The mitral valve is normal in structure. Mild to moderate mitral valve regurgitation.  6. The aortic valve is tricuspid. Aortic valve regurgitation is mild.  7. The inferior vena cava is normal in size with <50% respiratory variability, suggesting right atrial pressure of 8 mmHg. FINDINGS  Left Ventricle: Left ventricular ejection fraction, by estimation, is 60 to 65%. The left ventricle has normal function. The left ventricle has no regional wall motion abnormalities. The left ventricular internal cavity size was normal in size. There is  mild left ventricular hypertrophy. Left ventricular diastolic parameters are indeterminate. Right Ventricle: The right ventricular size is normal. No increase in right ventricular wall thickness. Right ventricular systolic function is normal. There is mildly elevated pulmonary artery systolic pressure. The tricuspid regurgitant velocity is 2.92  m/s, and with an assumed right atrial pressure of 8 mmHg, the estimated right ventricular systolic pressure is 42.1 mmHg. Left Atrium: Left atrial size was moderately dilated. Right Atrium: Right atrial size was moderately dilated. Pericardium: There is no evidence of pericardial effusion. Mitral Valve: The mitral valve is normal in structure. Mild to moderate mitral valve regurgitation. Tricuspid Valve: The tricuspid valve is normal in structure. Tricuspid valve regurgitation is mild. Aortic Valve: The aortic valve is tricuspid. Aortic valve regurgitation is mild. Aortic valve peak gradient measures 6.9 mmHg. Pulmonic Valve: The pulmonic valve was normal in structure. Pulmonic valve regurgitation is mild. Aorta: The aortic root  and ascending  aorta are structurally normal, with no evidence of dilitation. Venous: The inferior vena cava is normal in size with less than 50% respiratory variability, suggesting right atrial pressure of 8 mmHg. IAS/Shunts: No atrial level shunt detected by color flow Doppler.  LEFT VENTRICLE PLAX 2D LVIDd:         4.30 cm LVIDs:         3.50 cm LV PW:         1.00 cm LV IVS:        1.00 cm LVOT diam:     1.40 cm   3D Volume EF: LVOT Area:     1.54 cm  3D EF:        50 %                          LV EDV:       62 ml                          LV ESV:       31 ml                          LV SV:        31 ml RIGHT VENTRICLE          IVC RV Basal diam:  3.60 cm  IVC diam: 1.90 cm TAPSE (M-mode): 1.7 cm LEFT ATRIUM           Index        RIGHT ATRIUM           Index LA diam:      4.40 cm 2.89 cm/m   RA Area:     21.30 cm LA Vol (A2C): 88.7 ml 58.33 ml/m  RA Volume:   71.00 ml  46.69 ml/m LA Vol (A4C): 51.3 ml 33.73 ml/m  AORTIC VALVE AV Area (Vmax): 1.56 cm AV Vmax:        131.60 cm/s AV Peak Grad:   6.9 mmHg LVOT Vmax:      133.60 cm/s  AORTA Ao Root diam: 2.40 cm Ao Asc diam:  3.10 cm TRICUSPID VALVE TR Peak grad:   34.1 mmHg TR Vmax:        292.00 cm/s  SHUNTS Systemic Diam: 1.40 cm Redell Cave MD Electronically signed by Redell Cave MD Signature Date/Time: 05/19/2024/10:15:37 AM    Final    DG Chest 2 View Result Date: 05/18/2024 CLINICAL DATA:  Chest pain when coughing. EXAM: CHEST - 2 VIEW COMPARISON:  January 14, 2024 FINDINGS: The medial aspect of the mid and lower left lung is limited in evaluation on the frontal view secondary to overlying external hardware. The heart size and mediastinal contours are within normal limits. The lungs are hyperinflated with mild, chronic appearing increased lung markings. Mild biapical pleural thickening is suspected. Radiopaque surgical clips are seen overlying the right lung base. No acute infiltrate, pleural effusion or pneumothorax is identified.  Multilevel degenerative changes are present throughout the thoracic spine. IMPRESSION: Chronic appearing increased lung markings without evidence of acute or active cardiopulmonary disease. Electronically Signed   By: Suzen Dials M.D.   On: 05/18/2024 13:35   MR LUMBAR SPINE WO CONTRAST Result Date: 05/16/2024 EXAM: MRI LUMBAR SPINE 05/09/2024 05:15:18 PM TECHNIQUE: Multiplanar multisequence MRI of the lumbar spine was performed without the administration of intravenous contrast. COMPARISON: Lumbar radiographs 04/30/2024. CT abdomen  and pelvis 01/02/2018. CLINICAL HISTORY: 89 year old female with L1 compression fracture, low back pain, falls in November and December. FINDINGS: Normal lumbar segmentation on the comparisons. BONES AND ALIGNMENT: L1 compression fracture with mild to moderate loss of height is unchanged since 2019. Grade 1 anterolisthesis of L4 on L5 has increased since the prior CT. Otherwise stable lumbar lordosis. No visible lower thoracic or lumbar vertebral marrow edema. Background marrow signal is normal for the lumbar spine. Confluent abnormal marrow edema associated with the visible sacrum at S2 and S3 asymmetric to the left (Series 108, images 8-15). The visible right sacral ala is relatively spared. Visible SI joints remain intact. SPINAL CORD: Normal conus medullaris at T12. No signal abnormality in the visible lower thoracic spinal cord or conus. SOFT TISSUES: Posterior sacral level paraspinal muscle edema left greater than right. Chronic postoperative changes to the lower lumbar paraspinal soft tissues at L4-L5 superimposed. Otherwise negative lumbar paraspinal soft tissues. DEGENERATIVE: Generally mild for age lumbar spine degeneration, and capacious underlying spinal canal. Moderate lumbar facet arthropathy is present at L2-L3 and L3-L4 including degenerative facet joint fluid. At L3-L4 there is borderline to mild multifactorial spinal stenosis and moderate bilateral L3 neural  foraminal stenosis. At L4-L5 previous posterior decompression is noted in the setting of grade 1 spondylolisthesis. Chronic facet degeneration there, mild to moderate multifactorial L4 neural foraminal stenosis. L5-S1 chronic posterior decompression also with moderate residual facet and ligamentum flavum hypertrophy. Small left side synovial cyst on series 6, image 10. Mild L5 foraminal stenosis. IMPRESSION: 1. Acute to subacute sacral fractures partially visible; at the S2 and S3 in the midline and tracking to the left ala. Regional paraspinal muscle edema. 2. No recent traumatic injury identified in the lumbar spine. Chronic L1 compression fracture. 3. Lumbar spine degeneration and previous lower lumbar posterior decompression. Isolated mild lumbar spinal stenosis at L3-L4. Electronically signed by: Helayne Hurst MD 05/16/2024 07:02 AM EST RP Workstation: [id number]   DG Lumbar Spine 2-3 Views Result Date: 05/05/2024 EXAM: 2 or 3 VIEW(S) XRAY OF THE LUMBAR SPINE 04/30/2024 10:50:41 AM COMPARISON: CT abdomen and pelvis 01/03/2008. CLINICAL HISTORY: L3 compression fracture. FINDINGS: LUMBAR SPINE: BONES: Mild chronic compression deformity of L1 is unchanged from 2019. There is 5 mm of anterolisthesis at L4-L5 which is new from prior. No new acute fractures are seen. DISCS AND DEGENERATIVE CHANGES: There is mild disc space narrowing at L4-L5 which has progressed. Endplate osteophytes are seen throughout the lumbar spine compatible with degenerative change. SOFT TISSUES: There are atherosclerotic calcifications of the aorta. There are surgical clips in the right abdomen. IMPRESSION: 1. Mild chronic compression deformity of L1, unchanged from prior imaging. 2. New 5 mm anterolisthesis at L4-L5 with progressed mild disc space narrowing. 3. Mild degenerative changes have progressed. Electronically signed by: Greig Pique MD 05/05/2024 09:13 PM EST RP Workstation: [id number]   CT Cervical Spine Wo Contrast Result  Date: 05/05/2024 EXAM: CT CERVICAL SPINE WITHOUT CONTRAST 05/05/2024 12:43:10 AM TECHNIQUE: CT of the cervical spine was performed without the administration of intravenous contrast. Multiplanar reformatted images are provided for review. Automated exposure control, iterative reconstruction, and/or weight based adjustment of the mA/kV was utilized to reduce the radiation dose to as low as reasonably achievable. COMPARISON: None available. CLINICAL HISTORY: fall FINDINGS: BONES AND ALIGNMENT: No acute fracture or traumatic malalignment. DEGENERATIVE CHANGES: Multilevel severe left facet arthrosis. SOFT TISSUES: No prevertebral soft tissue swelling. LUNGS: Biapical pleuroparenchymal scarring. VASCULATURE: Bilateral carotid bifurcation atherosclerosis. IMPRESSION: 1. No acute findings. 2. Severe multilevel  left-sided facet arthrosis. Electronically signed by: Franky Stanford MD 05/05/2024 12:53 AM EST RP Workstation: [id number]   CT HEAD WO CONTRAST ( ) Result Date: 05/05/2024 EXAM: CT HEAD WITHOUT 05/05/2024 12:43:10 AM TECHNIQUE: CT of the head was performed without the administration of intravenous contrast. Automated exposure control, iterative reconstruction, and/or weight based adjustment of the mA/kV was utilized to reduce the radiation dose to as low as reasonably achievable. COMPARISON: CT Head dated 12/29/2014. CLINICAL HISTORY: Fall on Eliquis . FINDINGS: BRAIN AND VENTRICLES: No acute intracranial hemorrhage. No mass effect or midline shift. No extra-axial fluid collection. No evidence of acute infarct. No hydrocephalus. Chronic ischemic white matter changes. Atherosclerotic calcification of the carotid siphons. ORBITS: No acute abnormality. SINUSES AND MASTOIDS: No acute abnormality. SOFT TISSUES AND SKULL: No acute skull fracture. No acute soft tissue abnormality. IMPRESSION: 1. No acute intracranial abnormality. Electronically signed by: Franky Stanford MD 05/05/2024 12:48 AM EST RP Workstation:  [id number]   DG Foot Complete Left Result Date: 05/04/2024 EXAM: 3 OR MORE VIEW(S) XRAY OF THE LEFT FOOT 05/04/2024 10:12:16 PM COMPARISON: None available. CLINICAL HISTORY: left foot pain after fall FINDINGS: BONES AND JOINTS: Small calcaneal spur is noted. Tarsal degenerative changes are seen. No acute fracture or dislocation is noted. SOFT TISSUES: The soft tissues are unremarkable. IMPRESSION: 1. Mild degenerative change without acute abnormality. Electronically signed by: Oneil Devonshire MD 05/04/2024 10:15 PM EST RP Workstation: [id number]      Labs: BNP (last 3 results) Recent Labs    01/14/24 1536  BNP 293.3*   Basic Metabolic Panel: Recent Labs  Lab 05/18/24 1139 05/19/24 0614 05/20/24 0501  NA 135 137 136  K 3.9 4.0 4.3  CL 98 103 98  CO2 26 24 28   GLUCOSE 127* 103* 128*  BUN 10 10 12   CREATININE 0.59 0.65 0.63  CALCIUM  9.3 9.0 9.4  MG 2.1  --   --   PHOS 2.9  --   --    Liver Function Tests: Recent Labs  Lab 05/19/24 0614 05/20/24 0501  AST 38 41  ALT 34 42  ALKPHOS 373* 376*  BILITOT 0.6 0.6  PROT 7.0 7.3  ALBUMIN 3.9 4.0   No results for input(s): LIPASE, AMYLASE in the last 168 hours. No results for input(s): AMMONIA in the last 168 hours. CBC: Recent Labs  Lab 05/18/24 1139  WBC 8.5  HGB 13.2  HCT 41.0  MCV 94.0  PLT 344   Cardiac Enzymes: No results for input(s): CKTOTAL, CKMB, CKMBINDEX, TROPONINI in the last 168 hours. BNP: Invalid input(s): POCBNP CBG: No results for input(s): GLUCAP in the last 168 hours. D-Dimer No results for input(s): DDIMER in the last 72 hours. Hgb A1c No results for input(s): HGBA1C in the last 72 hours. Lipid Profile No results for input(s): CHOL, HDL, LDLCALC, TRIG, CHOLHDL, LDLDIRECT in the last 72 hours. Thyroid  function studies No results for input(s): TSH, T4TOTAL, T3FREE, THYROIDAB in the last 72 hours.  Invalid input(s): FREET3 Anemia work up No results  for input(s): VITAMINB12, FOLATE, FERRITIN, TIBC, IRON, RETICCTPCT in the last 72 hours. Urinalysis    Component Value Date/Time   COLORURINE YELLOW (A) 05/19/2024 0658   APPEARANCEUR CLEAR (A) 05/19/2024 0658   LABSPEC 1.009 05/19/2024 0658   PHURINE 7.0 05/19/2024 0658   GLUCOSEU NEGATIVE 05/19/2024 0658   HGBUR NEGATIVE 05/19/2024 0658   BILIRUBINUR NEGATIVE 05/19/2024 0658   KETONESUR NEGATIVE 05/19/2024 0658   PROTEINUR NEGATIVE 05/19/2024 0658   UROBILINOGEN 0.2 12/29/2014 1241   NITRITE NEGATIVE  05/19/2024 0658   LEUKOCYTESUR NEGATIVE 05/19/2024 0658   Sepsis Labs Recent Labs  Lab 05/18/24 1139  WBC 8.5   Microbiology Recent Results (from the past 240 hours)  Resp panel by RT-PCR (RSV, Flu A&B, Covid) Anterior Nasal Swab     Status: None   Collection Time: 05/18/24 11:26 AM   Specimen: Anterior Nasal Swab  Result Value Ref Range Status   SARS Coronavirus 2 by RT PCR NEGATIVE NEGATIVE Final    Comment: (NOTE) SARS-CoV-2 target nucleic acids are NOT DETECTED.  The SARS-CoV-2 RNA is generally detectable in upper respiratory specimens during the acute phase of infection. The lowest concentration of SARS-CoV-2 viral copies this assay can detect is 138 copies/mL. A negative result does not preclude SARS-Cov-2 infection and should not be used as the sole basis for treatment or other patient management decisions. A negative result may occur with  improper specimen collection/handling, submission of specimen other than nasopharyngeal swab, presence of viral mutation(s) within the areas targeted by this assay, and inadequate number of viral copies(<138 copies/mL). A negative result must be combined with clinical observations, patient history, and epidemiological information. The expected result is Negative.  Fact Sheet for Patients:  bloggercourse.com  Fact Sheet for Healthcare Providers:   seriousbroker.it  This test is no t yet approved or cleared by the [place]  FDA and  has been authorized for detection and/or diagnosis of SARS-CoV-2 by FDA under an Emergency Use Authorization (EUA). This EUA will remain  in effect (meaning this test can be used) for the duration of the COVID-19 declaration under Section 564(b)(1) of the Act, 21 U.S.C.section 360bbb-3(b)(1), unless the authorization is terminated  or revoked sooner.       Influenza A by PCR NEGATIVE NEGATIVE Final   Influenza B by PCR NEGATIVE NEGATIVE Final    Comment: (NOTE) The Xpert Xpress SARS-CoV-2/FLU/RSV plus assay is intended as an aid in the diagnosis of influenza from Nasopharyngeal swab specimens and should not be used as a sole basis for treatment. Nasal washings and aspirates are unacceptable for Xpert Xpress SARS-CoV-2/FLU/RSV testing.  Fact Sheet for Patients: bloggercourse.com  Fact Sheet for Healthcare Providers: seriousbroker.it  This test is not yet approved or cleared by the [place]  FDA and has been authorized for detection and/or diagnosis of SARS-CoV-2 by FDA under an Emergency Use Authorization (EUA). This EUA will remain in effect (meaning this test can be used) for the duration of the COVID-19 declaration under Section 564(b)(1) of the Act, 21 U.S.C. section 360bbb-3(b)(1), unless the authorization is terminated or revoked.     Resp Syncytial Virus by PCR NEGATIVE NEGATIVE Final    Comment: (NOTE) Fact Sheet for Patients: bloggercourse.com  Fact Sheet for Healthcare Providers: seriousbroker.it  This test is not yet approved or cleared by the [place]  FDA and has been authorized for detection and/or diagnosis of SARS-CoV-2 by FDA under an Emergency Use Authorization (EUA). This EUA will remain in effect (meaning this test can be used) for  the duration of the COVID-19 declaration under Section 564(b)(1) of the Act, 21 U.S.C. section 360bbb-3(b)(1), unless the authorization is terminated or revoked.  Performed at [facility], [address] [place], [place] [address]   Respiratory (~20 pathogens) panel by PCR     Status: Abnormal   Collection Time: 05/18/24  4:35 PM   Specimen: Nasopharyngeal Swab; Respiratory  Result Value Ref Range Status   Adenovirus NOT DETECTED NOT DETECTED Final   Coronavirus 229E NOT DETECTED NOT DETECTED Final  Comment: (NOTE) The Coronavirus on the Respiratory Panel, DOES NOT test for the novel  Coronavirus (2019 nCoV)    Coronavirus HKU1 NOT DETECTED NOT DETECTED Final   Coronavirus NL63 NOT DETECTED NOT DETECTED Final   Coronavirus OC43 NOT DETECTED NOT DETECTED Final   Metapneumovirus NOT DETECTED NOT DETECTED Final   Rhinovirus / Enterovirus NOT DETECTED NOT DETECTED Final   Influenza A H3 DETECTED (A) NOT DETECTED Final   Influenza B NOT DETECTED NOT DETECTED Final   Parainfluenza Virus 1 NOT DETECTED NOT DETECTED Final   Parainfluenza Virus 2 NOT DETECTED NOT DETECTED Final   Parainfluenza Virus 3 NOT DETECTED NOT DETECTED Final   Parainfluenza Virus 4 NOT DETECTED NOT DETECTED Final   Respiratory Syncytial Virus NOT DETECTED NOT DETECTED Final   Bordetella pertussis NOT DETECTED NOT DETECTED Final   Bordetella Parapertussis NOT DETECTED NOT DETECTED Final   Chlamydophila pneumoniae NOT DETECTED NOT DETECTED Final   Mycoplasma pneumoniae NOT DETECTED NOT DETECTED Final    Comment: Performed at [facility], [address] [place], [place] [address]  MRSA Next Gen by PCR, Nasal     Status: Abnormal   Collection Time: 05/21/24 10:43 AM   Specimen: Nasal Mucosa; Nasal Swab  Result Value Ref Range Status   MRSA by PCR Next Gen DETECTED (A) NOT DETECTED Final    Comment: RESULT CALLED TO, READ BACK BY AND VERIFIED WITH: BRIANNA BARTLE 05/21/24 1634 KLW (NOTE) The  GeneXpert MRSA Assay (FDA approved for NASAL specimens only), is one component of a comprehensive MRSA colonization surveillance program. It is not intended to diagnose MRSA infection nor to guide or monitor treatment for MRSA infections. Test performance is not FDA approved in patients less than 49 years old. Performed at [facility], [address] [place], [place] [address]      Total time spend on discharging this patient, including the last patient exam, discussing the hospital stay, instructions for ongoing care as it relates to all pertinent caregivers, as well as preparing the medical discharge records, prescriptions, and/or referrals as applicable, is 35 minutes.    Ellouise Haber, MD  [facility] 05/21/2024, 4:51 PM       [1]  Allergies Allergen Reactions   Calcium -Containing Compounds Other (See Comments)    Restless legs   Lactose Intolerance (Gi) Diarrhea   "

## 2024-05-21 NOTE — Plan of Care (Signed)
   Problem: Education: Goal: Knowledge of General Education information will improve Description: Including pain rating scale, medication(s)/side effects and non-pharmacologic comfort measures Outcome: Progressing   Problem: Clinical Measurements: Goal: Ability to maintain clinical measurements within normal limits will improve Outcome: Progressing Goal: Will remain free from infection Outcome: Progressing

## 2024-05-21 NOTE — Plan of Care (Signed)
   Problem: Clinical Measurements: Goal: Respiratory complications will improve Outcome: Progressing Goal: Cardiovascular complication will be avoided Outcome: Progressing   Problem: Activity: Goal: Risk for activity intolerance will decrease Outcome: Progressing

## 2024-05-21 NOTE — TOC Transition Note (Signed)
 Transition of Care [facility]) - Discharge Note   Patient Details  Name: Diane Chambers MRN: [id number] Date of Birth: 04/25/1934  Transition of Care [facility]) CM/SW Contact:  Lauraine JAYSON Carpen, LCSW Phone Number: 05/21/2024, 4:05 PM   Clinical Narrative:   Patient has orders to discharge to [facility] SN[facility] today. RN has already called report. [facility] has been arranged for 5:00. No further concerns. CSW signing off.  Final next level of care: Skilled Nursing Facility Barriers to Discharge: Barriers Resolved   Patient Goals and CMS Choice   CMS Medicare.gov Compare Post Acute Care list provided to:: Patient Choice offered to / list presented to : Patient, Adult Children      Discharge Placement   Existing PASRR number confirmed : 05/20/24          Patient chooses bed at: [facility] Patient to be transferred to facility by: [facility] Name of family member notified: [name] Patient and family notified of of transfer: 05/21/24  Discharge Plan and Services Additional resources added to the After Visit Summary for     Discharge Planning Services: CM Consult                                 Social Drivers of Health (SDOH) Interventions SDOH Screenings   Food Insecurity: No Food Insecurity (05/19/2024)  Housing: Unknown (05/19/2024)  Transportation Needs: No Transportation Needs (05/19/2024)  Utilities: Not At Risk (05/19/2024)  Social Connections: Moderately Integrated (05/19/2024)  Tobacco Use: Medium Risk (05/19/2024)     Readmission Risk Interventions    05/20/2024   11:25 AM  Readmission Risk Prevention Plan  Transportation Screening Complete  PCP or Specialist Appt within 3-5 Days --  HRI or Home Care Consult Complete  Social Work Consult for Recovery Care Planning/Counseling Complete  Palliative Care Screening Not Applicable  Medication Review Oceanographer) Complete

## 2024-05-21 NOTE — Progress Notes (Signed)
 Report called and given to [facility] RN named L[place]ell. Ivs removed, tele removed. PT has belongings. Son made aware of patient leaving today

## 2024-05-21 NOTE — Progress Notes (Signed)
 Updated [name] that patient was positive for MRSA

## 2024-05-21 NOTE — TOC Progression Note (Addendum)
 Transition of Care [facility]) - Progression Note    Patient Details  Name: Diane Chambers MRN: [id number] Date of Birth: 12-02-1933  Transition of Care [facility]) CM/SW Contact  Lauraine JAYSON Carpen, LCSW Phone Number: 05/21/2024, 11:48 AM  Clinical Narrative:   CSW met with patient and provided SNF bed offers. She asked CSW to call son. Reviewed current bed offers. Preferences in order are [facility], [facility] [address], [facility], and [facility]. Patient was at [facility] in [place] from 7/26-10/15 (81 days). Patient came to the ED on 12/2 and 12/13 but was not admitted. Yesterday's case manager reached out to facilities to see if this would affect her 60-day wellness period but received differing information. CSW sent referral to [facility]. [facility] and [facility] have not responded yet. [facility] has offered a bed. CSW called and left voicemail for [facility] admissions coordinator to let her know they are first preference and check on the 60-day wellness period. Also left messages for [facility], [facility], and [facility].  12:00 pm: [facility] is full.  1:00 pm: [name] is able to offer a bed and said her SNF days have reset. Son is aware and agreeable.  1:42 pm: Per MD and cardiologist, patient is ready for discharge. [name] is aware and can accept patient today. They will need discharge summary by 3:30. Sent secure chat to MD to notify. CSW updated patient and son.  Expected Discharge Plan: Skilled Nursing Facility Barriers to Discharge: Continued Medical Work up               Expected Discharge Plan and Services   Discharge Planning Services: CM Consult   Living arrangements for the past 2 months: Assisted Living Facility                                       Social Drivers of Health (SDOH) Interventions SDOH Screenings   Food Insecurity: No Food Insecurity (05/19/2024)   Housing: Unknown (05/19/2024)  Transportation Needs: No Transportation Needs (05/19/2024)  Utilities: Not At Risk (05/19/2024)  Social Connections: Moderately Integrated (05/19/2024)  Tobacco Use: Medium Risk (05/19/2024)    Readmission Risk Interventions    05/20/2024   11:25 AM  Readmission Risk Prevention Plan  Transportation Screening Complete  PCP or Specialist Appt within 3-5 Days --  HRI or Home Care Consult Complete  Social Work Consult for Recovery Care Planning/Counseling Complete  Palliative Care Screening Not Applicable  Medication Review Oceanographer) Complete

## 2024-05-21 NOTE — Progress Notes (Signed)
 "  Rounding Note   Patient Name: Diane Chambers Date of Encounter: 05/21/2024  [facility] Cardiologist: Evalene Lunger, MD   Subjective Patient reports feeling overall better. She can feel that her heart rate is improved. Remains in atrial flutter with rate well controlled, 55-100 bpm. Denies chest pain and shortness of breath.   Scheduled Meds:  apixaban   2.5 mg Oral BID   atorvastatin   20 mg Oral Daily   diltiazem   120 mg Oral Daily   gabapentin   100 mg Oral TID   metoprolol  tartrate  25 mg Oral BID   oseltamivir   30 mg Oral BID   sertraline   25 mg Oral Daily   traZODone   100 mg Oral QHS   Continuous Infusions:  PRN Meds: acetaminophen  **OR** acetaminophen , hydrALAZINE , ondansetron  **OR** ondansetron  (ZOFRAN ) IV, prochlorperazine    Vital Signs  Vitals:   05/20/24 1958 05/21/24 0001 05/21/24 0459 05/21/24 0838  BP: (!) 136/112 (!) 104/53 108/60 111/64  Pulse: 73 81 66 80  Resp: 18 18 18 12   Temp: 97.8 F (36.6 C) 98.2 F (36.8 C) 98.2 F (36.8 C) (!) 97.5 F (36.4 C)  TempSrc:    Oral  SpO2: 97% 94% 94% 94%  Weight:      Height:        Intake/Output Summary (Last 24 hours) at 05/21/2024 0852 Last data filed at 05/20/2024 2200 Gross per 24 hour  Intake 120 ml  Output 300 ml  Net -180 ml      05/18/2024   11:32 AM 05/18/2024   11:24 AM 05/05/2024    1:32 AM  Last 3 Weights  Weight (lbs) 105 lb 105 lb 105 lb  Weight (kg) 47.628 kg 47.628 kg 47.628 kg      Telemetry Atrial flutter with rate 55-100 bpm - Personally Reviewed  Physical Exam  GEN: No acute distress.   Neck: No JVD Cardiac: IRIR, no murmurs, rubs, or gallops.  Respiratory: Coarse breath sounds throughout GI: Soft, nontender, non-distended  MS: No edema; No deformity. Neuro:  Nonfocal  Psych: Normal affect   Labs High Sensitivity Troponin:  No results for input(s): TROPONINIHS in the last 720 hours.  Recent Labs  Lab 05/18/24 1139 05/18/24 1635  TRNPT 20* 17        Chemistry Recent Labs  Lab 05/18/24 1139 05/19/24 0614 05/20/24 0501  NA 135 137 136  K 3.9 4.0 4.3  CL 98 103 98  CO2 26 24 28   GLUCOSE 127* 103* 128*  BUN 10 10 12   CREATININE 0.59 0.65 0.63  CALCIUM  9.3 9.0 9.4  MG 2.1  --   --   PROT  --  7.0 7.3  ALBUMIN  --  3.9 4.0  AST  --  38 41  ALT  --  34 42  ALKPHOS  --  373* 376*  BILITOT  --  0.6 0.6  GFRNONAA >60 >60 >60  ANIONGAP 11 10 10     Lipids No results for input(s): CHOL, TRIG, HDL, LABVLDL, LDLCALC, CHOLHDL in the last 168 hours.  Hematology Recent Labs  Lab 05/18/24 1139  WBC 8.5  RBC 4.36  HGB 13.2  HCT 41.0  MCV 94.0  MCH 30.3  MCHC 32.2  RDW 14.1  PLT 344   Thyroid   Recent Labs  Lab 05/18/24 1139 05/19/24 2324  TSH <0.100*  --   FREET4  --  1.39    BNP Recent Labs  Lab 05/18/24 1139  PROBNP 2,820.0*    DDimer No  results for input(s): DDIMER in the last 168 hours.   Radiology  No results found.  Cardiac Studies   05/19/2024 Echo complete 1. Left ventricular ejection fraction, by estimation, is 60 to 65%. The  left ventricle has normal function. The left ventricle has no regional  wall motion abnormalities. There is mild left ventricular hypertrophy.  Left ventricular diastolic parameters  are indeterminate.   2. Right ventricular systolic function is normal. The right ventricular  size is normal. There is mildly elevated pulmonary artery systolic  pressure.   3. Left atrial size was moderately dilated.   4. Right atrial size was moderately dilated.   5. The mitral valve is normal in structure. Mild to moderate mitral valve  regurgitation.   6. The aortic valve is tricuspid. Aortic valve regurgitation is mild.   7. The inferior vena cava is normal in size with <50% respiratory  variability, suggesting right atrial pressure of 8 mmHg.    Patient Profile   89 y.o. female  with a history of persistent (?  Permanent) atrial fibrillation/flutter, chronic HFpEF,  hypertension, hyperlipidemia, multiple strokes with left arm weakness, TIAs, vascular dementia, hypothyroidism, Staph aureus bacteremia (August 2025), colitis, carotid arterial disease, cervical and lumbar disc disease, degenerative joint disease, restless leg syndrome, mitral regurgitation, and GERD who is being seen for the ongoing management of atrial flutter with RVR in the setting of influenza A and iatrogenic hyperthyroidism.    Assessment & Plan    Persistent vs permanent atrial flutter - Unclear chronicity/details, although reports prior DCCV about 3 years prior. Prior admission for sepsis 12/2023 during which she was in rapid atrial flutter. Follow up 01/2024 with AHF team was again in atrial flutter.  - Presenting in rapid atrial flutter likely secondary to influenza infection - TSH notably 0.100 - Echo this admission with EF 60-65% and moderately dilated LA - Telemetry reveals atrial flutter with rates well controlled - Continue Eliquis  2.5 mg twice daily (age, weight) and metoprolol  tartrate 25 mg twice daily - Will consolidate to diltiazem  120 mg daily   Influenza A - Presenting with cough, pleuritic chest pain, and headache with positive influenza A testing - Likely contributing to elevated HR - On Tamiflu  - Ongoing management per IM   Primary hypertension - BP labile but overall stable - Continue metoprolol  and diltiazem  as above   Chronic HFpEF - Echo this admission with preserved LV systolic function  - Pro BNP 2820 on admission - Appears euvolemic on exam - Continue to monitor volume status. Recommend net even fluid balance.    Hyperlipidemia - Continue statin therapy   Hx stroke - Chronic left arm weakness and resides in SNF - Continue anticoagulation and statin therapy as above   Hypothyroidism/iatrogenic hyperthyroidism - Takes levothyroxine  as outpatient with normal TSH in 12/2023  - TSH suppressed at < 0.100 on admission, T4 wnl - Ongoing management per  IM   For questions or updates, please contact [facility] Please consult [email or web address] for contact info under       Signed, Lesley LITTIE Maffucci, PA-C  05/21/2024, 8:52 AM    "

## 2024-05-21 NOTE — Plan of Care (Signed)
" °  Problem: Education: Goal: Knowledge of General Education information will improve Description: Including pain rating scale, medication(s)/side effects and non-pharmacologic comfort measures 05/21/2024 1520 by Marshia Tropea K, RN Outcome: Adequate for Discharge 05/21/2024 1041 by Freeda Leon POUR, RN Outcome: Progressing   Problem: Health Behavior/Discharge Planning: Goal: Ability to manage health-related needs will improve Outcome: Adequate for Discharge   Problem: Clinical Measurements: Goal: Ability to maintain clinical measurements within normal limits will improve 05/21/2024 1520 by Huston Stonehocker K, RN Outcome: Adequate for Discharge 05/21/2024 1041 by Freeda Leon POUR, RN Outcome: Progressing Goal: Will remain free from infection 05/21/2024 1520 by Freeda Leon POUR, RN Outcome: Adequate for Discharge 05/21/2024 1041 by Freeda Leon POUR, RN Outcome: Progressing Goal: Diagnostic test results will improve Outcome: Adequate for Discharge Goal: Respiratory complications will improve Outcome: Adequate for Discharge Goal: Cardiovascular complication will be avoided Outcome: Adequate for Discharge   Problem: Activity: Goal: Risk for activity intolerance will decrease Outcome: Adequate for Discharge   Problem: Nutrition: Goal: Adequate nutrition will be maintained Outcome: Adequate for Discharge   Problem: Coping: Goal: Level of anxiety will decrease Outcome: Adequate for Discharge   Problem: Elimination: Goal: Will not experience complications related to bowel motility Outcome: Adequate for Discharge Goal: Will not experience complications related to urinary retention Outcome: Adequate for Discharge   Problem: Pain Managment: Goal: General experience of comfort will improve and/or be controlled Outcome: Adequate for Discharge   Problem: Safety: Goal: Ability to remain free from injury will improve Outcome: Adequate for Discharge   Problem: Skin  Integrity: Goal: Risk for impaired skin integrity will decrease Outcome: Adequate for Discharge   "

## 2024-05-22 NOTE — Progress Notes (Unsigned)
 "  Telephone Visit- Progress Note: Referring Physician:  No referring provider defined for this encounter.  Primary Physician:  Pcp, No  This visit was performed via telephone.  Patient's son location: home Provider location: working from home  I spent a total of 15 minutes non-face-to-face activities for this visit on the date of this encounter including review of current clinical condition and response to treatment.    Patient's son has given verbal consent to this telephone visits and we reviewed the limitations of a telephone visit. Patient's son wishes to proceed.    Chief Complaint:  review imaging  History of Present Illness: Diane Chambers is a 89 y.o. female has a history of chronic afib with RVR, chronic diastolic CHF, endocarditis, HTN, TIA, myocardial injury, hypothyroidism, hyperlipidemia, vascular dementia.    History of lumbar surgery about 3 years ago.    Last seen by me on 04/30/24 for LBP s/p fall on 04/23/24. Xrays showed L1 compression fracture. She was interested in kyphoplasty procedure.   Lumbar MRI ordered. Phone visit scheduled with her son (okay per patient) to review the results. He is her POA.   Recent admission for flu and afib with RVR. Was discharged on 05/21/24.   Son states that she is doing okay regarding her back pain. She was discharged from hospital to SNF and has not been getting up much. Will be starting PT soon as she is weak from flu/afib/hospitalization.   She has not been wearing her TLSO brace much as she has not been out of bed.    She is on ELIQUIS .    Tobacco use: Does not smoke.    Bowel/Bladder Dysfunction: chronic bowel urgency x years   Conservative measures:  Physical therapy:  has not participated in Multimodal medical therapy including regular antiinflammatories:  Tylenol , Lidocaine  patches, Robaxin  Injections:  no epidural steroid injections   Past Surgery:  Lumbar surgery about 3 years ago in [place].    The symptoms are  causing a significant impact on the patient's life.     Exam: No exam done as this was a telephone encounter.     Imaging: Lumbar MRI dated 05/09/24:  FINDINGS: Normal lumbar segmentation on the comparisons.   BONES AND ALIGNMENT: L1 compression fracture with mild to moderate loss of height is unchanged since 2019. Grade 1 anterolisthesis of L4 on L5 has increased since the prior CT. Otherwise stable lumbar lordosis. No visible lower thoracic or lumbar vertebral marrow edema. Background marrow signal is normal for the lumbar spine.   Confluent abnormal marrow edema associated with the visible sacrum at S2 and S3 asymmetric to the left (Series 108, images 8-15). The visible right sacral ala is relatively spared. Visible SI joints remain intact.   SPINAL CORD: Normal conus medullaris at T12. No signal abnormality in the visible lower thoracic spinal cord or conus.   SOFT TISSUES: Posterior sacral level paraspinal muscle edema left greater than right. Chronic postoperative changes to the lower lumbar paraspinal soft tissues at L4-L5 superimposed. Otherwise negative lumbar paraspinal soft tissues. DEGENERATIVE:   Generally mild for age lumbar spine degeneration, and capacious underlying spinal canal.   Moderate lumbar facet arthropathy is present at L2-L3 and L3-L4 including degenerative facet joint fluid.   At L3-L4 there is borderline to mild multifactorial spinal stenosis and moderate bilateral L3 neural foraminal stenosis.   At L4-L5 previous posterior decompression is noted in the setting of grade 1 spondylolisthesis. Chronic facet degeneration there, mild to moderate multifactorial L4 neural  foraminal stenosis. L5-S1 chronic posterior decompression also with moderate residual facet and ligamentum flavum hypertrophy. Small left side synovial cyst on series 6, image 10. Mild L5 foraminal stenosis.   IMPRESSION: 1. Acute to subacute sacral fractures partially visible;  at the S2 and S3 in the midline and tracking to the left ala. Regional paraspinal muscle edema. 2. No recent traumatic injury identified in the lumbar spine. Chronic L1 compression fracture. 3. Lumbar spine degeneration and previous lower lumbar posterior decompression. Isolated mild lumbar spinal stenosis at L3-L4.   Electronically signed by: Helayne Hurst MD 05/16/2024 07:02 AM EST RP Workstation: [id number]   I have personally reviewed the images and agree with the above interpretation.  Assessment and Plan: [name] had a fall around 04/23/24 with increased LBP. No leg pain.   Per her son [name], she is doing okay regarding her back pain. She has not complained of pain recently.   MRI shows that L1 compression fracture is chronic. She has acute to subacute sacral fractures at S2 and S3. Also with lumbar spondylosis with mild central stenosis L3-L4.    Xrays from today show L1 compression fracture.    Treatment options discussed with patient's son and following plan made:   - She is not a candidate for kyphoplatsy as L1 fracture is old.  - Sacral fractures should heal over next 2-3 months.  - She can d/c TLSO brace. Would still avoid bending, lifting, twisting until LBP is better.  - Okay to participate in PT at SNF from my standpoint.  - She will f/u prn at her son's request.   Glade Boys PA-C Neurosurgery "

## 2024-05-24 ENCOUNTER — Ambulatory Visit: Admitting: Orthopedic Surgery

## 2024-05-24 ENCOUNTER — Encounter: Payer: Self-pay | Admitting: Orthopedic Surgery

## 2024-05-24 DIAGNOSIS — M48061 Spinal stenosis, lumbar region without neurogenic claudication: Secondary | ICD-10-CM

## 2024-05-24 DIAGNOSIS — S3210XD Unspecified fracture of sacrum, subsequent encounter for fracture with routine healing: Secondary | ICD-10-CM | POA: Diagnosis not present

## 2024-05-24 DIAGNOSIS — S3210XA Unspecified fracture of sacrum, initial encounter for closed fracture: Secondary | ICD-10-CM

## 2024-05-24 DIAGNOSIS — S32010D Wedge compression fracture of first lumbar vertebra, subsequent encounter for fracture with routine healing: Secondary | ICD-10-CM | POA: Diagnosis not present

## 2024-05-24 DIAGNOSIS — W19XXXD Unspecified fall, subsequent encounter: Secondary | ICD-10-CM

## 2024-05-24 DIAGNOSIS — M47816 Spondylosis without myelopathy or radiculopathy, lumbar region: Secondary | ICD-10-CM | POA: Diagnosis not present

## 2024-05-24 NOTE — Progress Notes (Signed)
 "  Cardiology Office Note    Date:  05/27/2024   ID:  KYNDAL HERINGER, DOB 23-Jul-1933, MRN [id number]  PCP:  Pcp, No  Cardiologist:  Evalene Lunger, MD  Electrophysiologist:  None   Chief Complaint: Hospital follow-up  History of Present Illness:   Diane Chambers is a 89 y.o. female with history of persistent versus permanent atrial fibrillation/flutter, chronic HFpEF, hypertension, hyperlipidemia, multiple strokes with left arm weakness, TIAs, vascular dementia, hypothyroidism, colitis, carotid arterial disease, cervical and lumbar disc disease, degenerative joint disease, restless leg syndrome, mitral regurgitation, and GERD who presents for hospital follow-up.    Patient reported that she was previously diagnosed with atrial fibrillation several years ago and maybe underwent a cardioversion at some point by a Dr. Delores in [facility], [place] .  She was admitted to [facility] 12/2023 with dyspnea, weakness, and anorexia.  She was febrile, hypoxic, and tachycardic with EKG showing rapid atrial flutter.  She was admitted and treated for acute bronchitis and bacteremia.  Respiratory panel was also positive for metapneumovirus.  She underwent TEE which showed normal LV and RV function with mild to moderate MR.  She was discharged on IV daptomycin  and oral doxycycline .  Atrial flutter appeared to be conservatively managed with rate control and anticoagulation.   Patient was recently admitted to [facility] 05/19/2024 with cough and fatigue.  She was afebrile, tachycardic, and hypertensive on admission with EKG showing atrial flutter with rate of 138 bpm.  proBNP also noted to be elevated.  Respiratory panel was positive for influenza A which was treated with Tamiflu .  Initially treated with IV diltiazem  which was transitioned to oral later in admission.  Echo with EF 60 to 65% and moderately dilated left atrium.  She was discharged with rates well-controlled on diltiazem  120 mg daily, metoprolol  tartrate 12.5  mg twice daily, and Eliquis  2.5 mg twice daily.  Patient presents today accompanied by her son overall doing well from a cardiac perspective.  She has been residing in a skilled nursing facility to rehabilitate after discharge from the hospital.  She feels overall recovered from the flu with some residual cough.  She denies chest pain, palpitations, shortness of breath, orthopnea, PND, lightheadedness, dizziness, and lower extremity swelling.  Denies bleeding and hematochezia.  Labs independently reviewed: 04/2024-sodium 136, potassium 4.3, BUN 12, creatinine 0.63, normal LFTs, Hgb 13.2, HCT 41.0, platelets 344  Objective   Past Medical History:  Diagnosis Date   (HFpEF) heart failure with preserved ejection fraction (HCC)    a. 12/2014 Echo: EF 55-60%, grI DD; b. 12/2023 Echo: EF 65-70%, mild-mod MR/TR; c. 12/2023 TEE: EF 55-60%, no rwma, nl RV fxn, no LA/LAA thrombus, mild-mod MR, triv AI.   Age-related macular degeneration, wet, right eye (HCC)    Atrial flutter (HCC)    a. Noted 12/2023 - ? persistent vs permanent. CHA2DS2VASc = 8-->eliquis  2.5 (age/wt).   Carotid artery stenosis, asymptomatic, bilateral    last duplex in epic 02-18-2015  bilateral ICA <50%   Chronically dry eyes    Fracture 01/24/2018   right foot-neck of fifth metatarsal    GERD (gastroesophageal reflux disease)    Hiatal hernia    History of colitis 12/16/2016   secondary to champylobacter   History of recurrent TIAs    total x4  last one 12-29-2014---  (01-23-2018 per pt no residual's)   History of syncope    11/ 2018 approx. , cardiac work-up done by dr ladona    Hypertension    Hypothyroidism  followed by pcp   Lower urinary tract symptoms (LUTS)    Mitral regurgitation    a. 12/2023 TEE: mild-mod MR.   OA (osteoarthritis)    knees   RLS (restless legs syndrome)    Venous stasis    01-23-2018 per pt elevates feet when sitting , if not toes become numb   Wears glasses    Wears hearing aid in right ear      Current Medications: Active Medications[1]  Allergies:   Calcium -containing compounds and Lactose intolerance (gi)   Social History   Socioeconomic History   Marital status: Widowed    Spouse name: Not on file   Number of children: Not on file   Years of education: Not on file   Highest education level: Not on file  Occupational History   Not on file  Tobacco Use   Smoking status: Former    Current packs/day: 0.00    Types: Cigarettes    Start date: 01/23/1990    Quit date: 01/24/2016    Years since quitting: 8.3   Smokeless tobacco: Never  Vaping Use   Vaping status: Never Used  Substance and Sexual Activity   Alcohol use: Yes    Alcohol/week: 12.0 - 14.0 standard drinks of alcohol    Types: 12 - 14 Standard drinks or equivalent per week    Comment: 1-2 drink per night prior to SNF   Drug use: No   Sexual activity: Not on file  Other Topics Concern   Not on file  Social History Narrative   Not on file   Social Drivers of Health   Tobacco Use: Medium Risk (05/27/2024)   Patient History    Smoking Tobacco Use: Former    Smokeless Tobacco Use: Never    Passive Exposure: Not on Actuary Strain: Not on file  Food Insecurity: No Food Insecurity (05/19/2024)   Epic    Worried About Programme Researcher, Broadcasting/film/video in the Last Year: Never true    Ran Out of Food in the Last Year: Never true  Transportation Needs: No Transportation Needs (05/19/2024)   Epic    Lack of Transportation (Medical): No    Lack of Transportation (Non-Medical): No  Physical Activity: Not on file  Stress: Not on file  Social Connections: Moderately Integrated (05/19/2024)   Social Connection and Isolation Panel    Frequency of Communication with Friends and Family: More than three times a week    Frequency of Social Gatherings with Friends and Family: More than three times a week    Attends Religious Services: More than 4 times per year    Active Member of [employer] or Organizations: Yes     Attends Banker Meetings: 1 to 4 times per year    Marital Status: Widowed  Depression (PHQ2-9): Not on file  Alcohol Screen: Not on file  Housing: Unknown (05/19/2024)   Epic    Unable to Pay for Housing in the Last Year: No    Number of Times Moved in the Last Year: Not on file    Homeless in the Last Year: No  Utilities: Not At Risk (05/19/2024)   Epic    Threatened with loss of utilities: No  Health Literacy: Not on file     Family History:  The patient's family history includes Diabetes in her mother; Hyperlipidemia in her father.  ROS:   12-point review of systems is negative unless otherwise noted in the HPI.  EKGs/Other Studies Reviewed:  Studies reviewed were summarized above. The additional studies were reviewed today:  05/19/2024 2D echo 1. Left ventricular ejection fraction, by estimation, is 60 to 65%. The  left ventricle has normal function. The left ventricle has no regional  wall motion abnormalities. There is mild left ventricular hypertrophy.  Left ventricular diastolic parameters  are indeterminate.   2. Right ventricular systolic function is normal. The right ventricular  size is normal. There is mildly elevated pulmonary artery systolic  pressure.   3. Left atrial size was moderately dilated.   4. Right atrial size was moderately dilated.   5. The mitral valve is normal in structure. Mild to moderate mitral valve  regurgitation.   6. The aortic valve is tricuspid. Aortic valve regurgitation is mild.   7. The inferior vena cava is normal in size with <50% respiratory  variability, suggesting right atrial pressure of 8 mmHg.   EKG:  EKG personally reviewed by me today EKG Interpretation Date/Time:  Monday May 27 2024 09:16:13 EST Ventricular Rate:  64 PR Interval:    QRS Duration:  94 QT Interval:  446 QTC Calculation: 460 R Axis:   57  Text Interpretation: Atrial flutter with variable A-V block When compared with ECG of  18-May-2024 11:45, Atrial flutter has replaced Wide QRS tachycardia Vent. rate has decreased BY  74 BPM Confirmed by Lorene Sinclair ([id number]) on 05/27/2024 9:34:40 AM  PHYSICAL EXAM:    VS:  BP 110/60 (BP Location: Right Arm, Patient Position: Sitting, Cuff Size: Normal)   Pulse 64   Ht 5' 6 (1.676 m)   Wt 101 lb (45.8 kg)   SpO2 96%   BMI 16.30 kg/m   BMI: Body mass index is 16.3 kg/m.  GEN: Well nourished, well developed in no acute distress NECK: No JVD; No carotid bruits CARDIAC: Irregular, regular rate, no murmurs, rubs, gallops RESPIRATORY:  Clear to auscultation without rales, wheezing or rhonchi  ABDOMEN: Soft, non-tender, non-distended EXTREMITIES: No edema; No deformity  Wt Readings from Last 3 Encounters:  05/27/24 101 lb (45.8 kg)  05/18/24 105 lb (47.6 kg)  05/05/24 105 lb (47.6 kg)                  ASSESSMENT & PLAN:   Persistent vs permanent atrial flutter - Patient's son provides additional details regarding atrial flutter/fibrillation.  Reports prior cardioversion about 3 years ago while residing in [facility] and another more recent cardioversion in the late summer of this year.  She was admitted at [facility] 12/2023 during which she was in atrial flutter.  Recent hospital admission 04/2024 for influenza complicated by atrial flutter with RVR.  Echo with EF 60 to 65% and moderately dilated LA.  She is asymptomatic to her atrial flutter.  Rates are well-controlled on current regimen of diltiazem  120 mg daily and metoprolol  tartrate 12.5 mg twice daily.  She is continued on Eliquis  2.5 mg twice daily (age and weight).  Update CBC and BMP today.  Recommend referral to EP to further discuss indication for cardioversion versus continuing with rate control approach.  Chronic HFpEF - Recent echo with EF 55 to 60%.  Appears euvolemic on exam, not currently requiring standing loop diuretic.  She is continued on Jardiance  10 mg daily.  Primary hypertension - BP  well-controlled on current doses of metoprolol  and diltiazem .  Mitral regurgitation - Recent echo with moderate mitral valve regurgitation.  Recommend periodic monitoring.  Hyperlipidemia - Continue atorvastatin  20 mg daily.  Hx stroke - Continue anticoagulation  and statin therapy as above.    Disposition: Update CBC and BMP.  Referral to EP.  F/u with Dr. Gollan or an APP in 2 to 3 months.   Medication Adjustments/Labs and Tests Ordered: Current medicines are reviewed at length with the patient today.  Concerns regarding medicines are outlined above. Medication changes, Labs and Tests ordered today are summarized above and listed in the Patient Instructions accessible in Encounters.   Bonney Lesley Maffucci, PA-C 05/27/2024 10:34 AM     [facility] [place] [address] [place], [place] [address] [phone]      [1]  Current Meds  Medication Sig   acetaminophen  (TYLENOL ) 500 MG tablet Take 2 tablets (1,000 mg total) by mouth every 6 (six) hours as needed.   ascorbic acid  (VITAMIN C ) 500 MG tablet Take 500 mg by mouth daily.   atorvastatin  (LIPITOR) 20 MG tablet Take 20 mg by mouth daily.   b complex vitamins capsule Take 1 capsule by mouth daily.   cetirizine (ZYRTEC) 10 MG tablet Take 10 mg by mouth 2 (two) times daily.   Chlorhexidine  Gluconate Cloth 2 % PADS Apply 6 each topically daily for 5 days.   Cholecalciferol  (VITAMIN D ) 50 MCG (2000 UT) CAPS Take 2,000 Units by mouth daily.   dextromethorphan (DELSYM) 30 MG/5ML liquid Take 10 mg by mouth as needed for cough.   diltiazem  (CARDIZEM  CD) 120 MG 24 hr capsule Take 1 capsule (120 mg total) by mouth daily.   ELIQUIS  2.5 MG TABS tablet Take 2.5 mg by mouth 2 (two) times daily.   empagliflozin  (JARDIANCE ) 10 MG TABS tablet Take 10 mg by mouth daily.   gabapentin  (NEURONTIN ) 100 MG capsule Take 100 mg by mouth 3 (three) times daily.   hydrOXYzine  (ATARAX ) 10 MG tablet Take 1 tablet (10 mg  total) by mouth every 8 (eight) hours as needed. Home med.   lidocaine  (LIDODERM ) 5 % Place 1 patch onto the skin every 12 (twelve) hours. Remove & Discard patch within 12 hours or as directed by MD   loperamide (IMODIUM) 2 MG capsule Take 2 mg by mouth every 6 (six) hours as needed for diarrhea or loose stools.   methocarbamol  (ROBAXIN ) 500 MG tablet Take 1 tablet (500 mg total) by mouth every 8 (eight) hours as needed.   metoprolol  tartrate (LOPRESSOR ) 25 MG tablet Take 12.5 mg by mouth 2 (two) times daily. Hold for SBP <110   mupirocin  ointment (BACTROBAN ) 2 % Place 1 Application into the nose 2 (two) times daily.   potassium chloride  (KLOR-CON ) 10 MEQ tablet Take 10 mEq by mouth daily.   sertraline  (ZOLOFT ) 25 MG tablet Take 25 mg by mouth daily.   traZODone  (DESYREL ) 100 MG tablet Take 100 mg by mouth at bedtime.   ZINC OXIDE, TOPICAL, 10 % CREA Apply 1 Application topically in the morning and at bedtime. Apply to buttocks topically for redness   "

## 2024-05-27 ENCOUNTER — Ambulatory Visit: Attending: Physician Assistant | Admitting: Physician Assistant

## 2024-05-27 ENCOUNTER — Encounter: Payer: Self-pay | Admitting: Physician Assistant

## 2024-05-27 VITALS — BP 110/60 | HR 64 | Ht 66.0 in | Wt 101.0 lb

## 2024-05-27 DIAGNOSIS — I483 Typical atrial flutter: Secondary | ICD-10-CM | POA: Insufficient documentation

## 2024-05-27 DIAGNOSIS — I5032 Chronic diastolic (congestive) heart failure: Secondary | ICD-10-CM | POA: Diagnosis not present

## 2024-05-27 DIAGNOSIS — Z8673 Personal history of transient ischemic attack (TIA), and cerebral infarction without residual deficits: Secondary | ICD-10-CM | POA: Insufficient documentation

## 2024-05-27 DIAGNOSIS — E782 Mixed hyperlipidemia: Secondary | ICD-10-CM | POA: Insufficient documentation

## 2024-05-27 DIAGNOSIS — I10 Essential (primary) hypertension: Secondary | ICD-10-CM | POA: Diagnosis not present

## 2024-05-27 DIAGNOSIS — I340 Nonrheumatic mitral (valve) insufficiency: Secondary | ICD-10-CM | POA: Insufficient documentation

## 2024-05-27 NOTE — Addendum Note (Signed)
 Addended byBETHA HILMA HASTINGS on: 05/27/2024 08:12 AM   Modules accepted: Level of Service

## 2024-05-27 NOTE — Patient Instructions (Signed)
 Medication Instructions:  Your physician recommends that you continue on your current medications as directed. Please refer to the Current Medication list given to you today.  *If you need a refill on your cardiac medications before your next appointment, please call your pharmacy*  Lab Work: Your provider would like for you to have following labs drawn today CBC, BMET.   If you have labs (blood work) drawn today and your tests are completely normal, you will receive your results only by: MyChart Message (if you have MyChart) OR A paper copy in the mail If you have any lab test that is abnormal or we need to change your treatment, we will call you to review the results.  Testing/Procedures: No test ordered today   Follow-Up: At [facility] you and your health needs are our priority.  As part of our continuing mission to provide you with exceptional heart care, our providers are all part of one team.  This team includes your primary Cardiologist (physician) and Advanced Practice Providers or APPs (Physician Assistants and Nurse Practitioners) who all work together to provide you with the care you need, when you need it.  Your next appointment:   2-3  month(s)  Provider:   You may see Timothy Gollan, MD or one of the following Advanced Practice Providers on your designated Care Team:   Lonni Meager, NP Lesley Maffucci, PA-C Bernardino Bring, PA-C Cadence Adams, PA-C Tylene Lunch, NP Barnie Hila, NP    We recommend signing up for the patient portal called MyChart.  Sign up information is provided on this After Visit Summary.  MyChart is used to connect with patients for Virtual Visits (Telemedicine).  Patients are able to view lab/test results, encounter notes, upcoming appointments, etc.  Non-urgent messages can be sent to your provider as well.   To learn more about what you can do with MyChart, go to [email or web address].   Other Instructions  REFERRAL TO CARDIAC  ELECTROPHYSIOLOGY

## 2024-05-28 ENCOUNTER — Ambulatory Visit: Payer: Self-pay | Admitting: Physician Assistant

## 2024-05-28 LAB — BASIC METABOLIC PANEL WITH GFR
BUN/Creatinine Ratio: 17 (ref 12–28)
BUN: 12 mg/dL (ref 8–27)
CO2: 23 mmol/L (ref 20–29)
Calcium: 9.8 mg/dL (ref 8.7–10.3)
Chloride: 101 mmol/L (ref 96–106)
Creatinine, Ser: 0.69 mg/dL (ref 0.57–1.00)
Glucose: 97 mg/dL (ref 70–99)
Potassium: 4.8 mmol/L (ref 3.5–5.2)
Sodium: 138 mmol/L (ref 134–144)
eGFR: 83 mL/min/1.73

## 2024-05-28 LAB — CBC
Hematocrit: 43.5 % (ref 34.0–46.6)
Hemoglobin: 13.5 g/dL (ref 11.1–15.9)
MCH: 30.1 pg (ref 26.6–33.0)
MCHC: 31 g/dL — ABNORMAL LOW (ref 31.5–35.7)
MCV: 97 fL (ref 79–97)
Platelets: 434 x10E3/uL (ref 150–450)
RBC: 4.48 x10E6/uL (ref 3.77–5.28)
RDW: 12.5 % (ref 11.7–15.4)
WBC: 7.1 x10E3/uL (ref 3.4–10.8)

## 2024-08-06 ENCOUNTER — Ambulatory Visit: Admitting: Physician Assistant
